# Patient Record
Sex: Male | Born: 1965
Health system: Southern US, Community
[De-identification: ages and names within clinical notes are randomized; demographics above are authoritative.]

## PROBLEM LIST (undated history)

## (undated) DIAGNOSIS — Z9289 Personal history of other medical treatment: Secondary | ICD-10-CM

## (undated) DIAGNOSIS — J189 Pneumonia, unspecified organism: Secondary | ICD-10-CM

## (undated) DIAGNOSIS — Q909 Down syndrome, unspecified: Secondary | ICD-10-CM

## (undated) DIAGNOSIS — E7800 Pure hypercholesterolemia, unspecified: Secondary | ICD-10-CM

## (undated) HISTORY — DX: Pneumonia, unspecified organism: J18.9

## (undated) HISTORY — DX: Down syndrome, unspecified: Q90.9

## (undated) HISTORY — DX: Personal history of other medical treatment: Z92.89

## (undated) HISTORY — DX: Pure hypercholesterolemia, unspecified: E78.00

---

## 2004-10-06 HISTORY — PX: HERNIA REPAIR: SHX51

## 2005-03-14 ENCOUNTER — Ambulatory Visit (HOSPITAL_COMMUNITY): Admission: RE | Admit: 2005-03-14 | Discharge: 2005-03-14 | Payer: Self-pay | Admitting: General Surgery

## 2005-03-14 IMAGING — CT CT ABDOMEN W/ CM
1 of 4 series · 14 of 32 positions shown, 19 images · IV contrast (omnipaque)
Comparison: none

CLINICAL DATA: Abdominal pain and tenderness.  Question hernia.
TECHNIQUE: Multidetector CT imaging of the abdomen and pelvis was performed following the standard protocol during bolus administration of intravenous contrast.
 Contrast:  125 cc Omnipaque 300.
 ABDOMEN CT WITH CONTRAST:
 Imaged lung parenchyma is clear.  Portion of the liver dome is not included in the scan.  Imaged liver parenchyma is normal.  Multiple stones are seen layering dependently in the gallbladder.  Kidneys, spleen, pancreas, and adrenal glands are all normal.  No abdominal lymphadenopathy.  The patient has an umbilical hernia.  This contains only mesenteric fat.  Note is made that the fat within the hernia has interstitial infiltration which appears somewhat more prominent on delayed imaging.  No focal bony abnormality.

[Series 2: abd_pel 5.0 b40f st · axial · 0.80mm/px · z∈[-610,-190]mm · 14 of 96 slices shown, 19 images]
[im 6/96  soft-tissue]
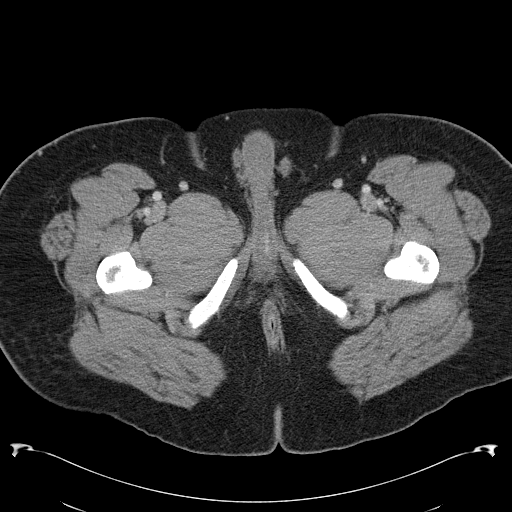
[im 6/96  bone]
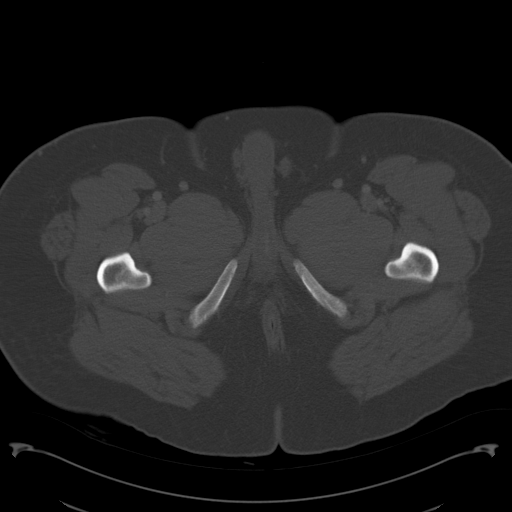
[im 12/96  soft-tissue]
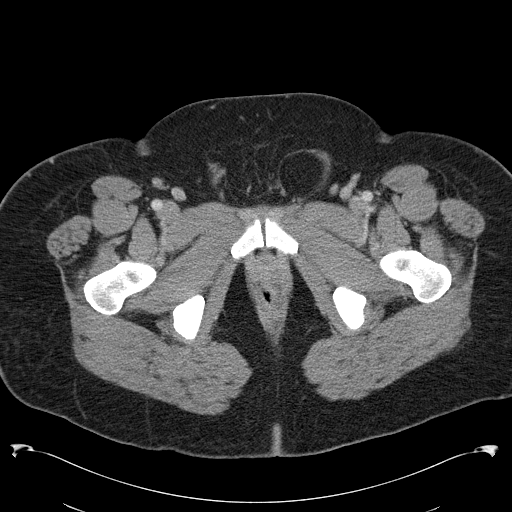
[im 18/96  soft-tissue]
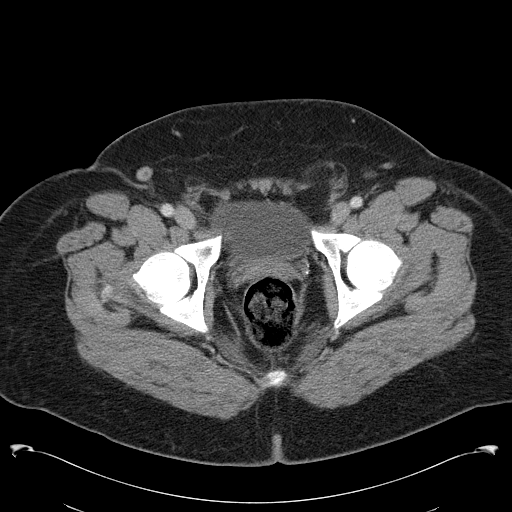
[im 30/96  soft-tissue]
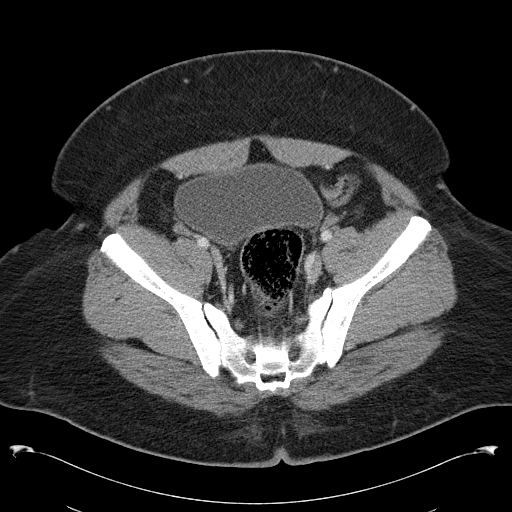
[im 36/96  soft-tissue]
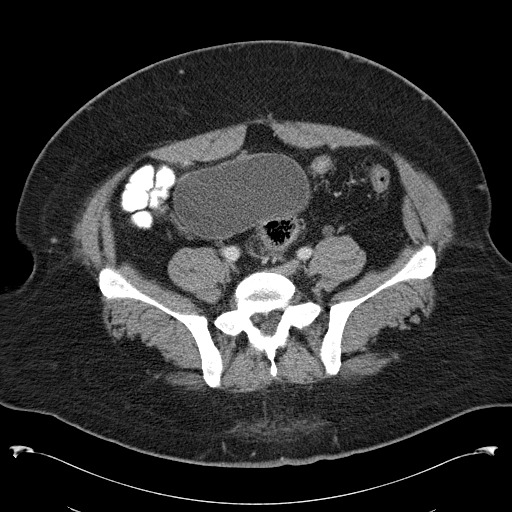
[im 42/96  soft-tissue]
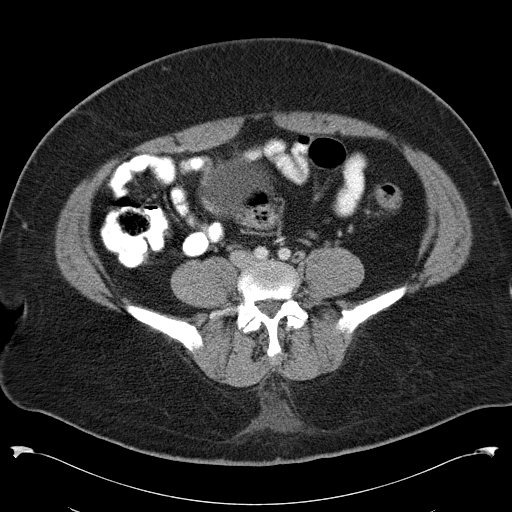
[im 48/96  soft-tissue]
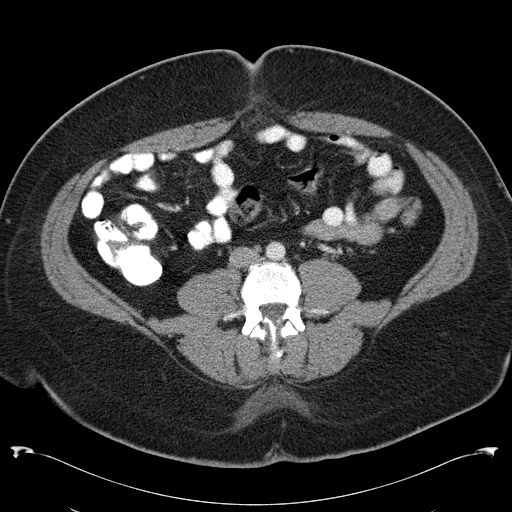
[im 54/96  soft-tissue]
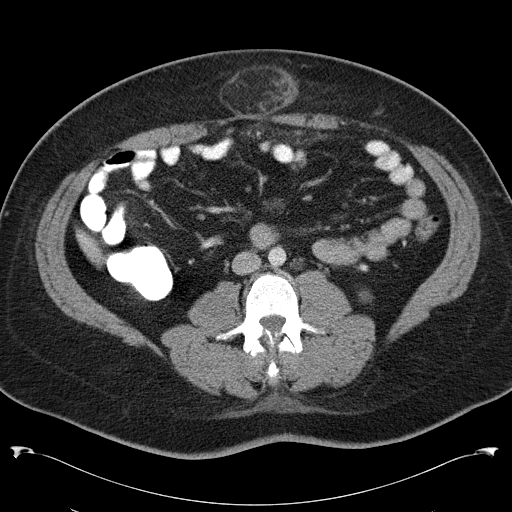
[im 60/96  soft-tissue]
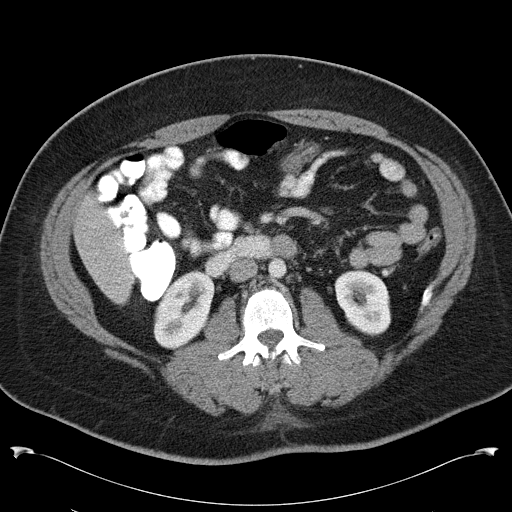
[im 60/96  bone]
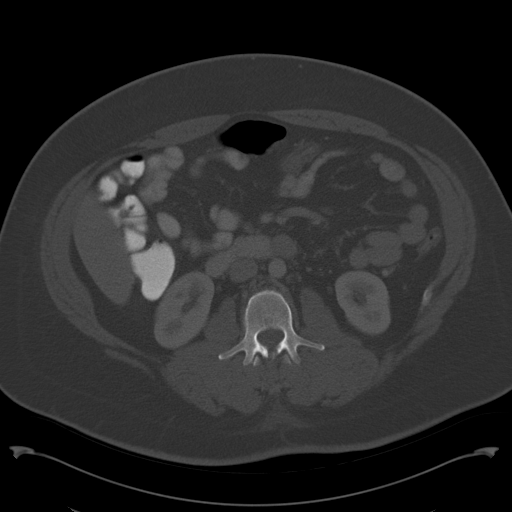
[im 66/96  soft-tissue]
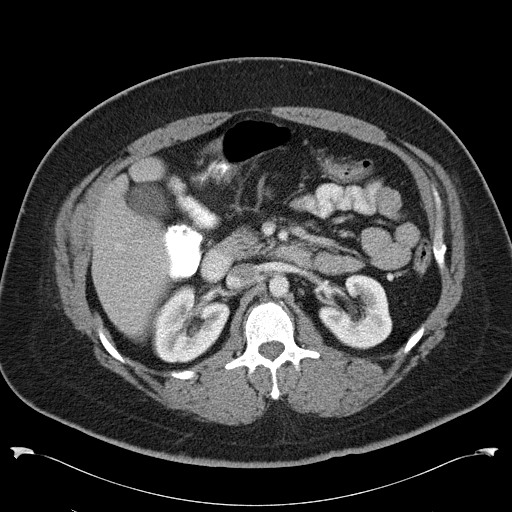
[im 72/96  lung]
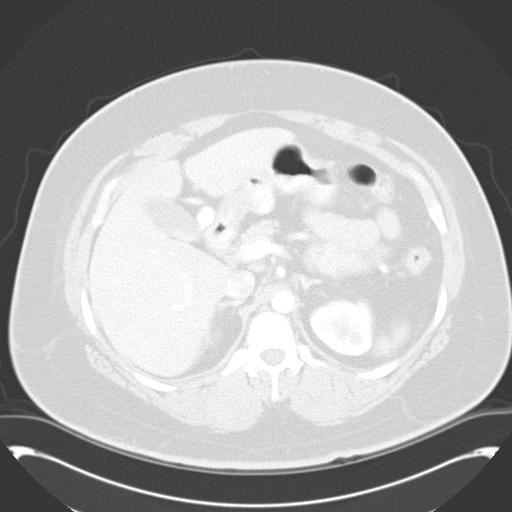
[im 78/96  soft-tissue]
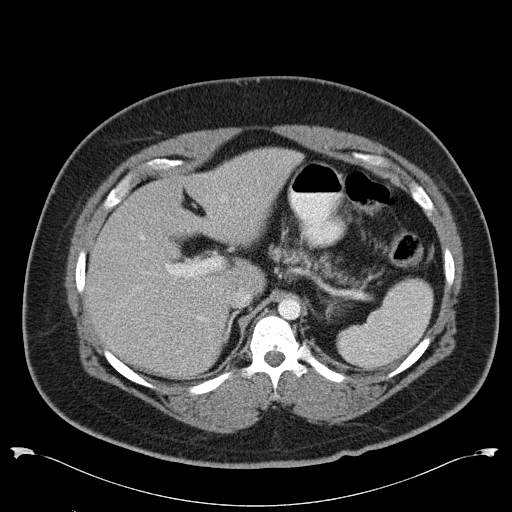
[im 78/96  lung]
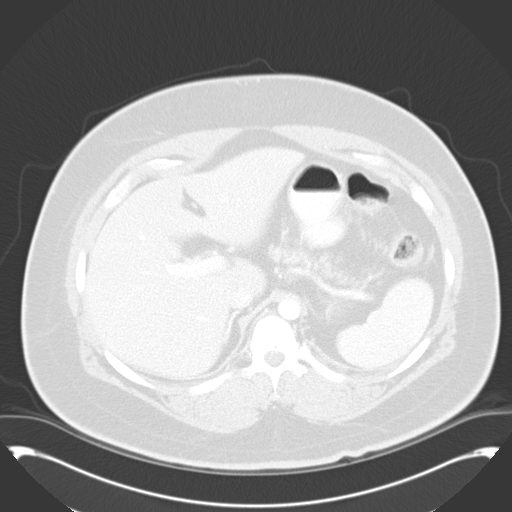
[im 84/96  soft-tissue]
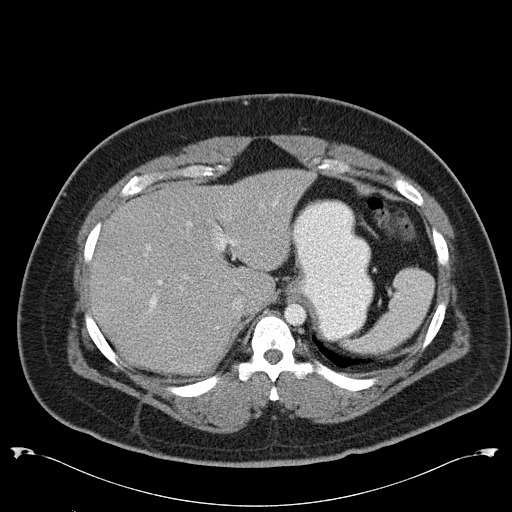
[im 84/96  lung]
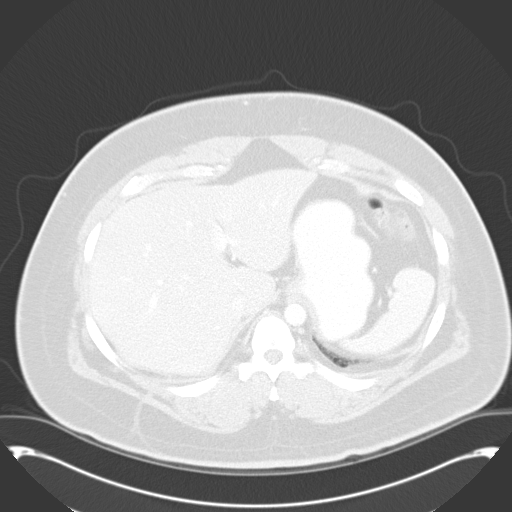
[im 90/96  soft-tissue]
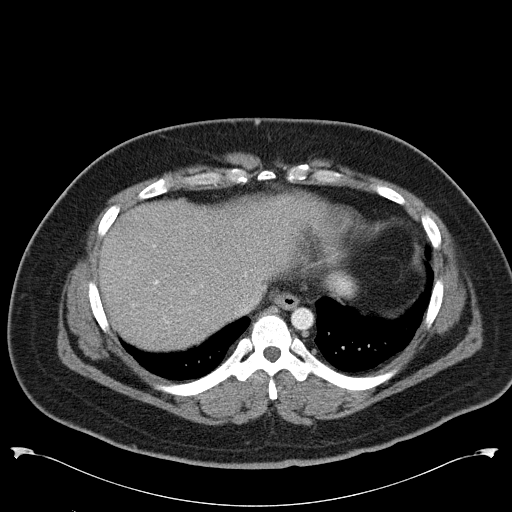
[im 90/96  lung]
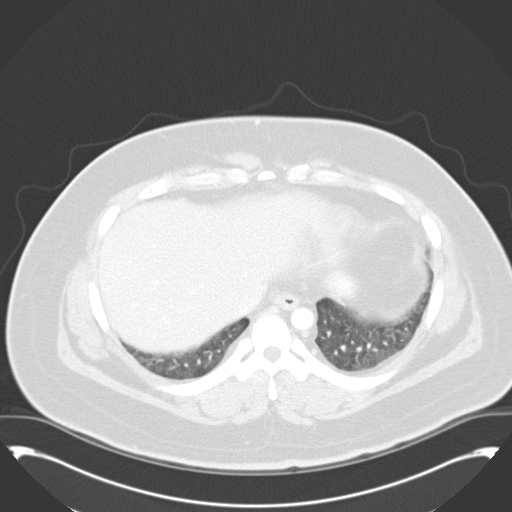

[14 of 32 positions shown; findings below may reference images not displayed]

IMPRESSION: 1.  Fat-containing umbilical hernia with infiltration of the fat within the hernia.  This could be due to inflammatory change or infarction of fat. 
 2.  Gallstones.
 PELVIS CT WITH CONTRAST:
 The patient has bilateral inguinal hernias which contain fat only.  Left-sided hernia is larger.  Colon and urinary bladder appear normal.  Appendix is not discretely visualized, but no inflammatory change is seen in the right lower quadrant.
IMPRESSION: Bilateral inguinal hernias which contain mesenteric fat only.

## 2005-03-14 IMAGING — CR DG CERVICAL SPINE FLEX&EXT ONLY
3 series · 3 of 3 positions shown · non-contrast
Comparison: none

CLINICAL DATA: Down?s Syndrome.  Evaluate for instability.
 LATERAL CERVICAL SPINE WITH FLEXION AND EXTENSION:
 Mild disc degeneration and spurring is present at C3-4, C4-5, C5-6 and C6-7.
 The C1-C2 relationship is normal and there is no abnormal movement on flexion or extension.  Remainder of the cervical alignment is normal.

[view not recorded (1 of 3)]
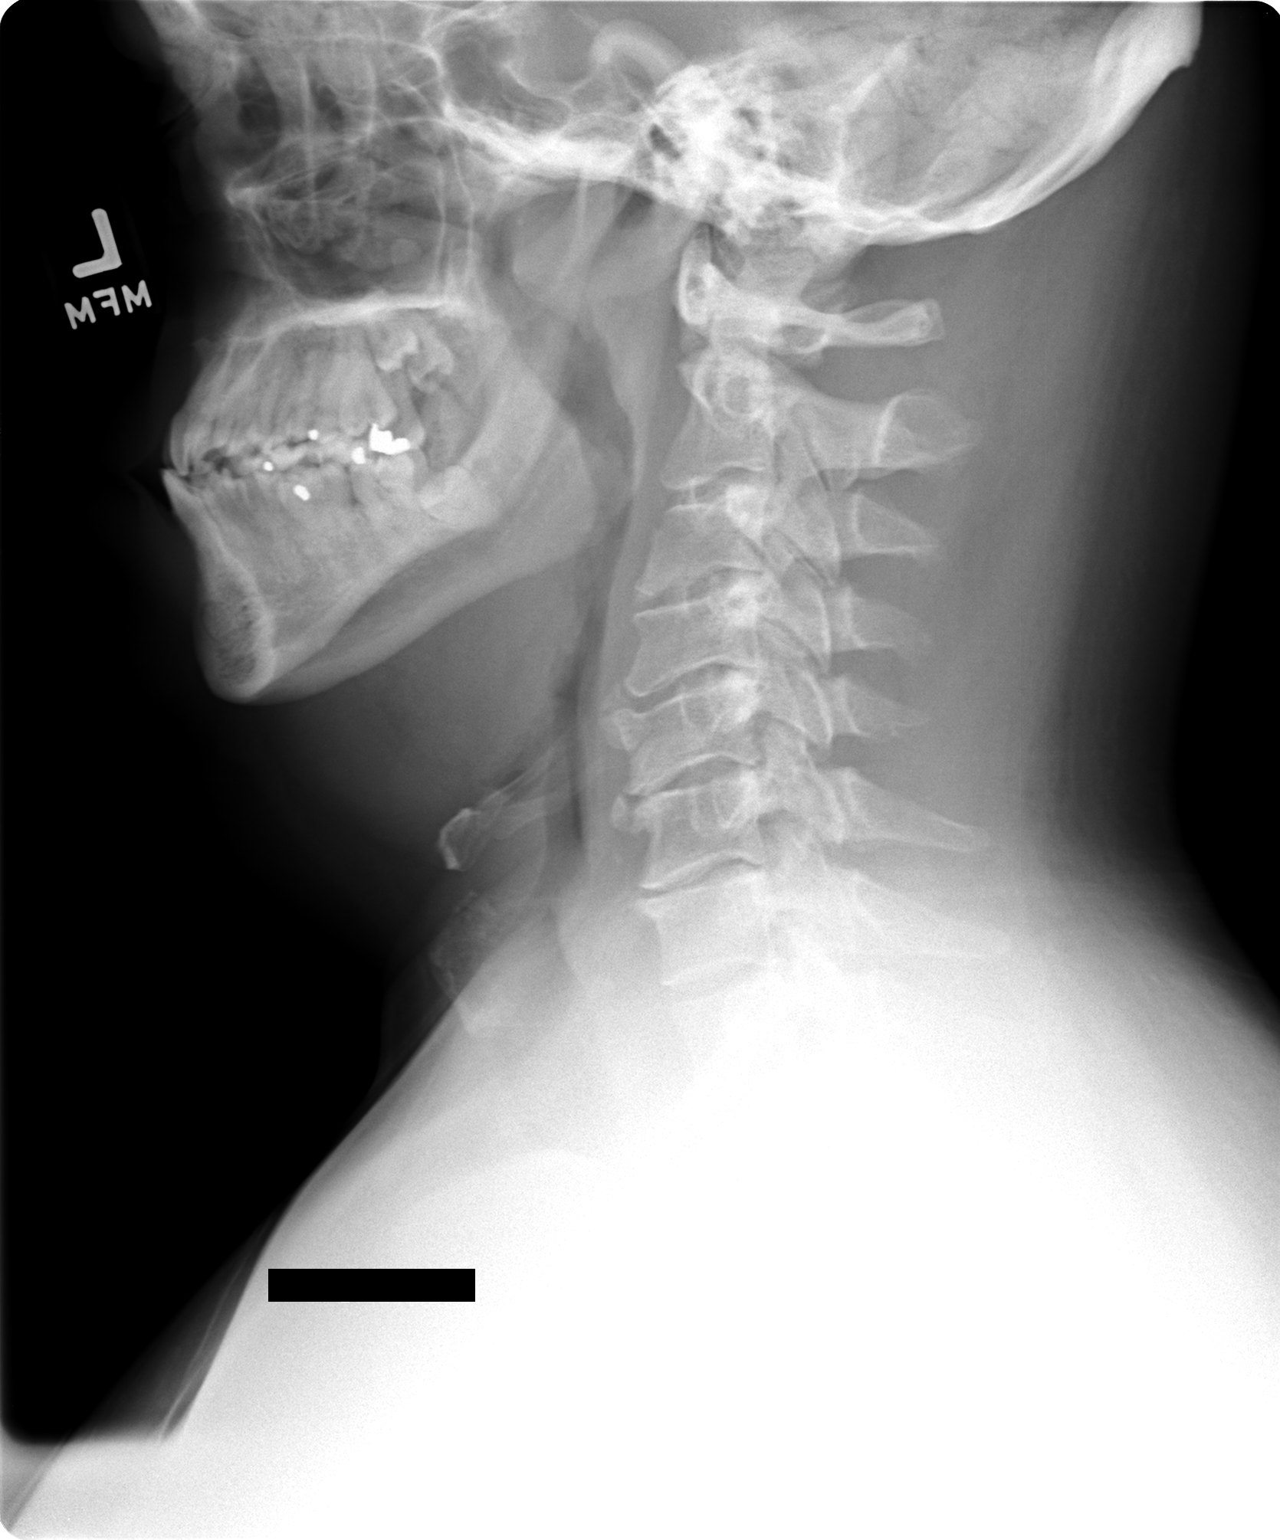

[view not recorded (2 of 3)]
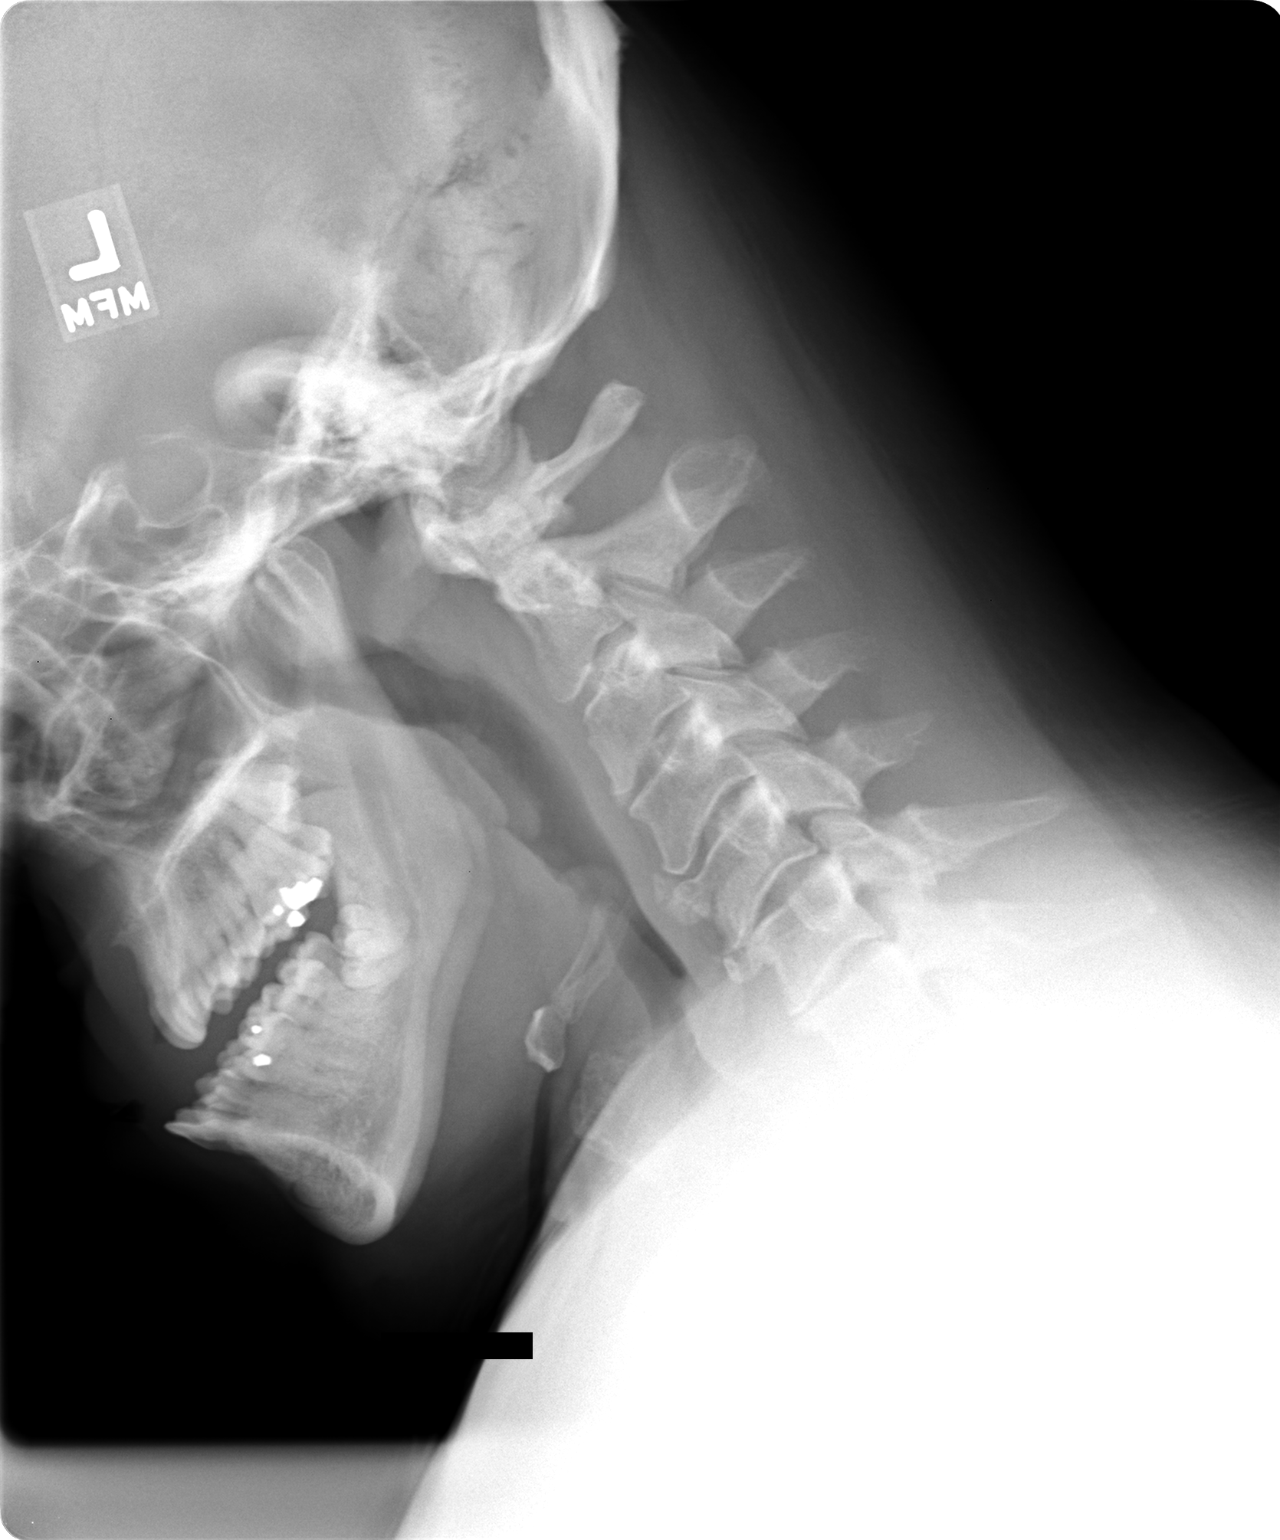

[view not recorded (3 of 3)]
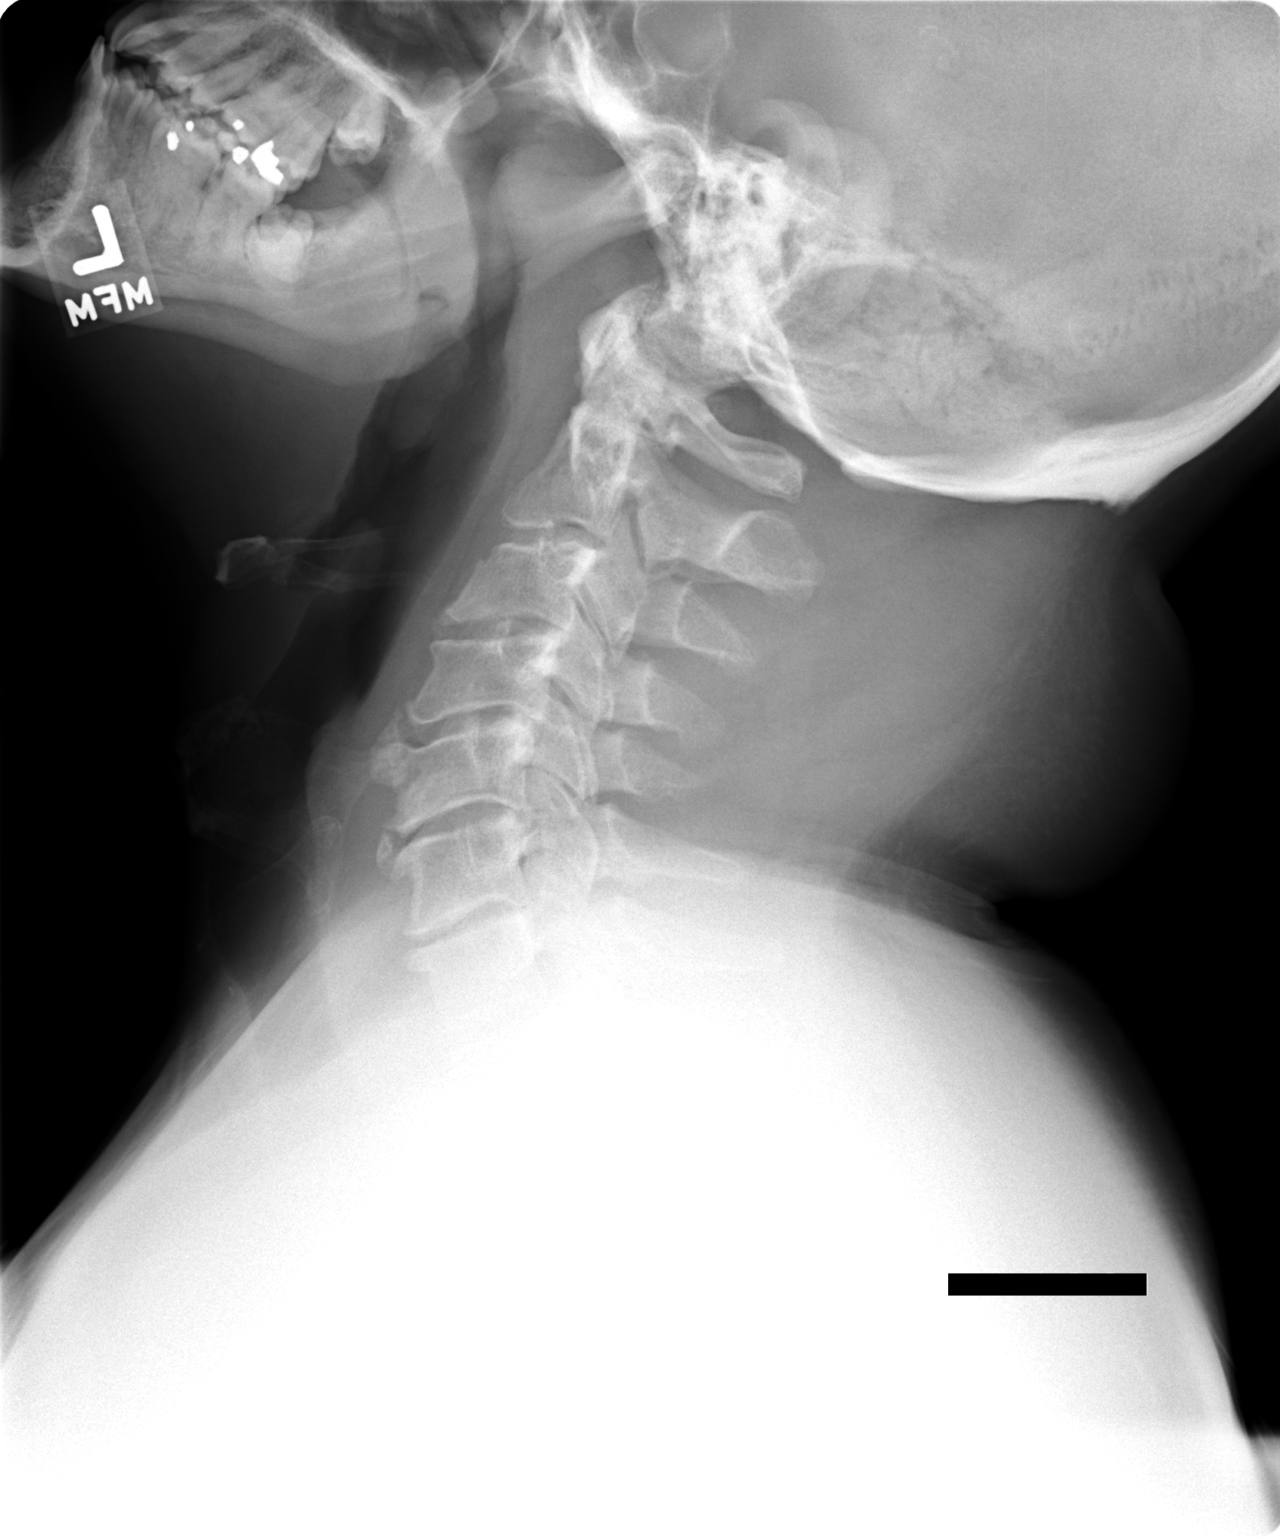

[3 of 3 positions shown; findings below may reference images not displayed]

IMPRESSION: 1. There is no abnormal movement.
 2. Cervical disc degeneration and spondylosis.

## 2005-04-18 ENCOUNTER — Observation Stay (HOSPITAL_COMMUNITY): Admission: RE | Admit: 2005-04-18 | Discharge: 2005-04-19 | Payer: Self-pay | Admitting: General Surgery

## 2005-04-18 IMAGING — CR DG ABD PORTABLE 1V
1 series · 1 of 1 positions shown · non-contrast
Comparison: None.

CLINICAL DATA: Incorrect needle count.

[view not recorded]
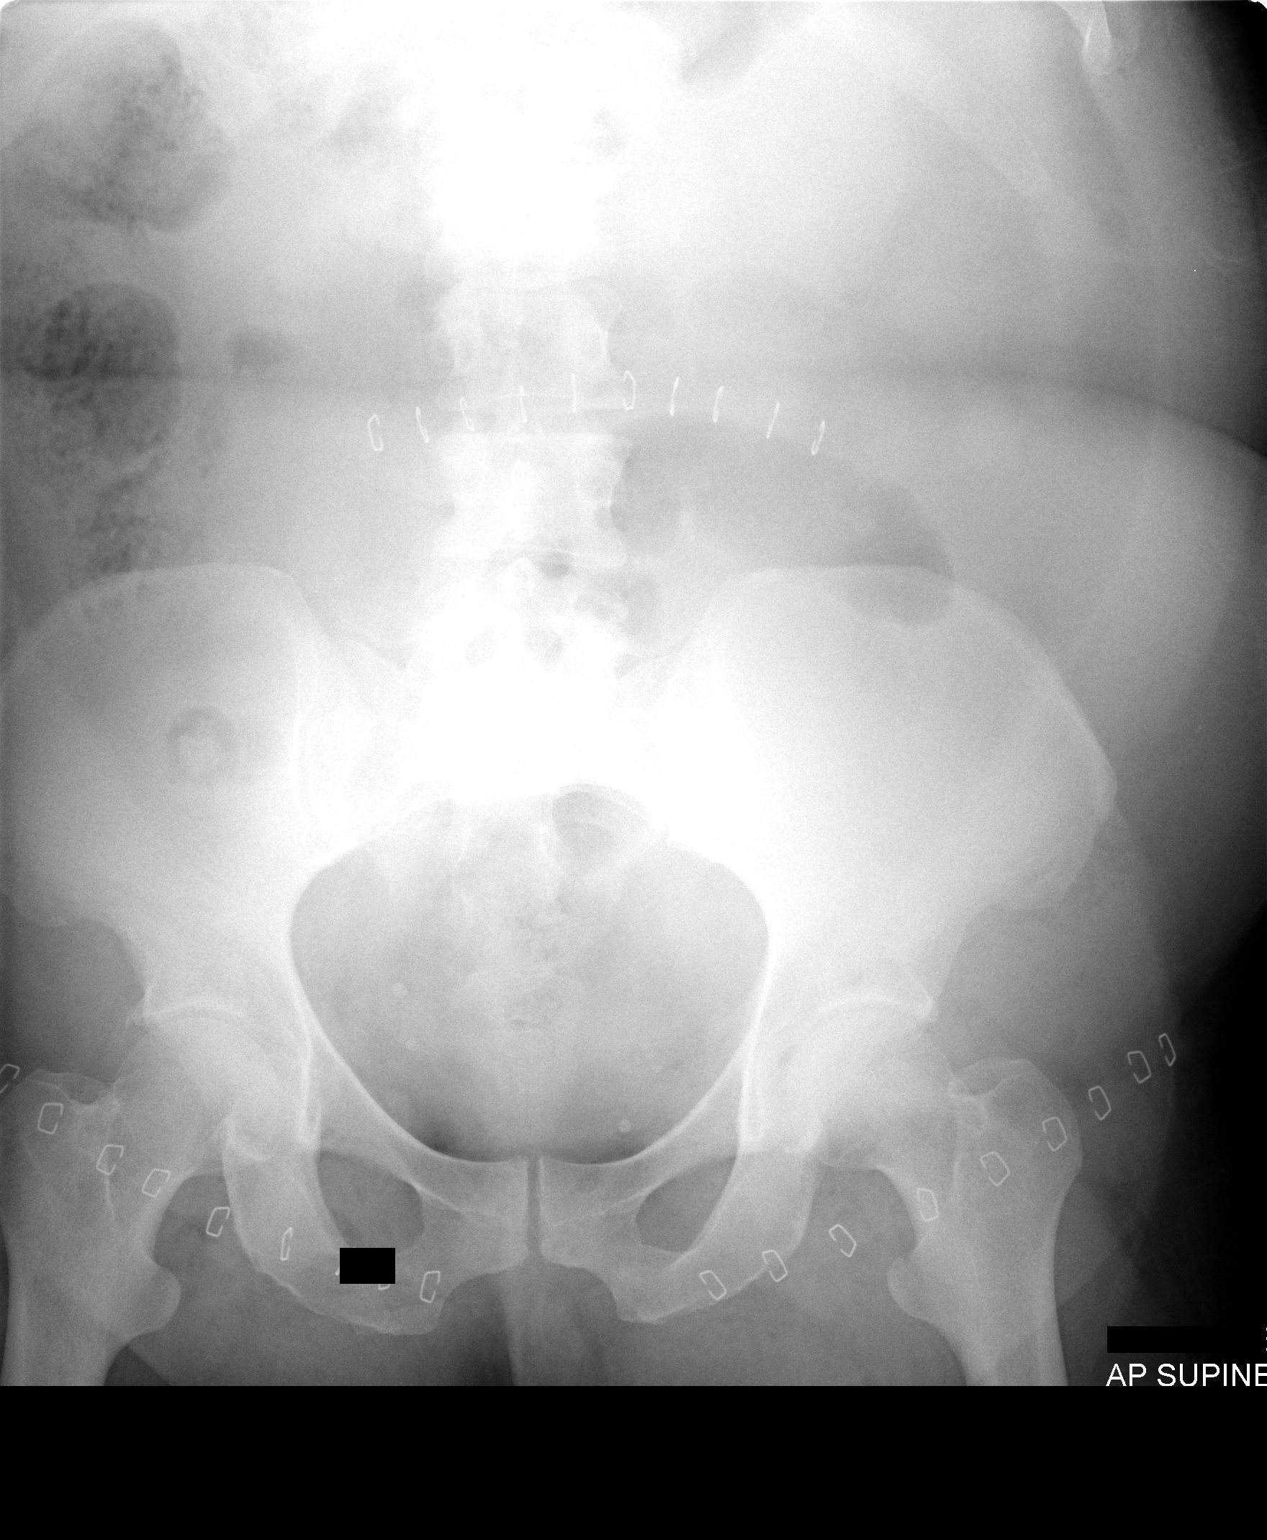

[1 of 1 positions shown; findings below may reference images not displayed]

PORTABLE ABDOMEN - 1 VIEW:

Nonspecific bowel gas pattern. Surgical skin staples over the region of the
umbilicus in both inguinal regions. No unexpected radiopaque foreign body.

[]
IMPRESSION: No evidence for an unexpected radiopaque foreign body

## 2011-11-25 ENCOUNTER — Ambulatory Visit: Payer: Medicare Other

## 2011-11-25 ENCOUNTER — Ambulatory Visit (INDEPENDENT_AMBULATORY_CARE_PROVIDER_SITE_OTHER): Payer: Medicare Other | Admitting: Family Medicine

## 2011-11-25 DIAGNOSIS — R05 Cough: Secondary | ICD-10-CM | POA: Diagnosis not present

## 2011-11-25 DIAGNOSIS — R059 Cough, unspecified: Secondary | ICD-10-CM

## 2011-11-25 DIAGNOSIS — R0602 Shortness of breath: Secondary | ICD-10-CM

## 2011-11-25 DIAGNOSIS — R509 Fever, unspecified: Secondary | ICD-10-CM

## 2011-11-25 DIAGNOSIS — J189 Pneumonia, unspecified organism: Secondary | ICD-10-CM

## 2011-11-25 DIAGNOSIS — H6120 Impacted cerumen, unspecified ear: Secondary | ICD-10-CM | POA: Diagnosis not present

## 2011-11-25 DIAGNOSIS — Q909 Down syndrome, unspecified: Secondary | ICD-10-CM

## 2011-11-25 IMAGING — CR DG CHEST 2V
2 series · 2 of 2 positions shown · non-contrast
Comparison: No priors.

CLINICAL DATA: Cough and fever.

CHEST - 2 VIEW

[PA]
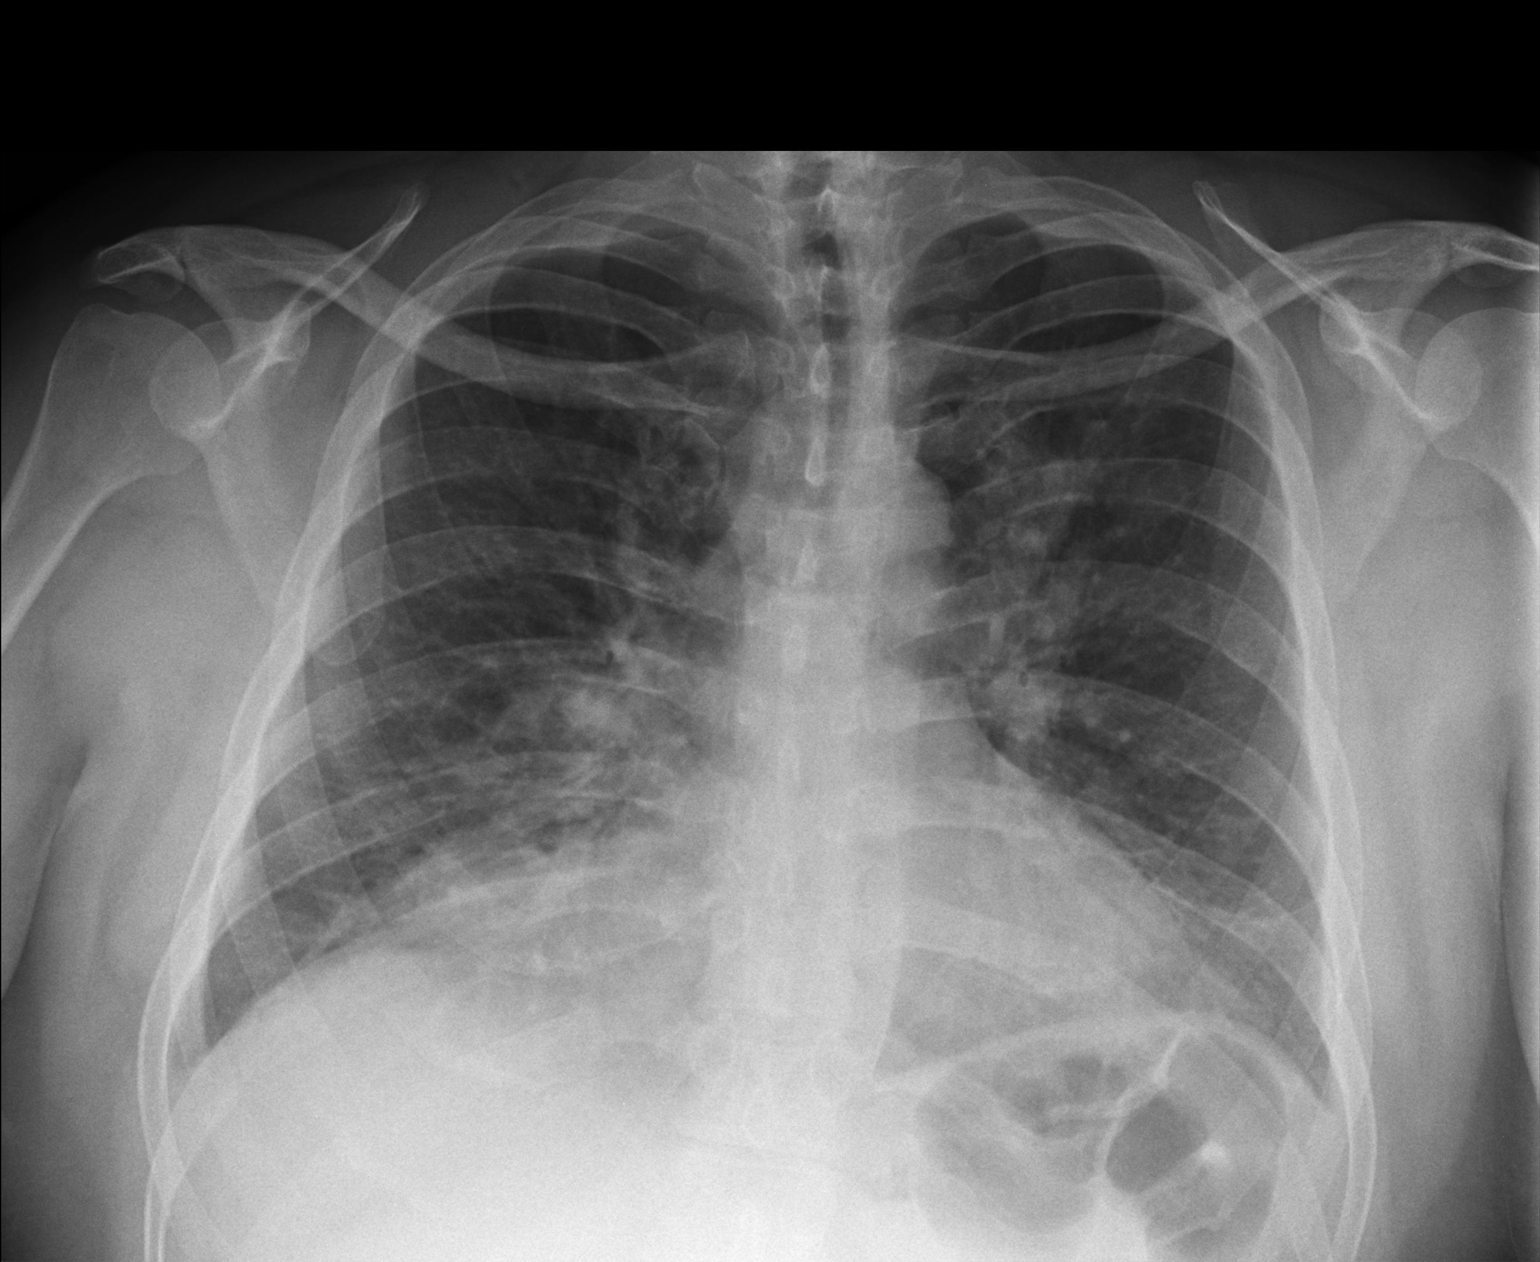

[lateral]
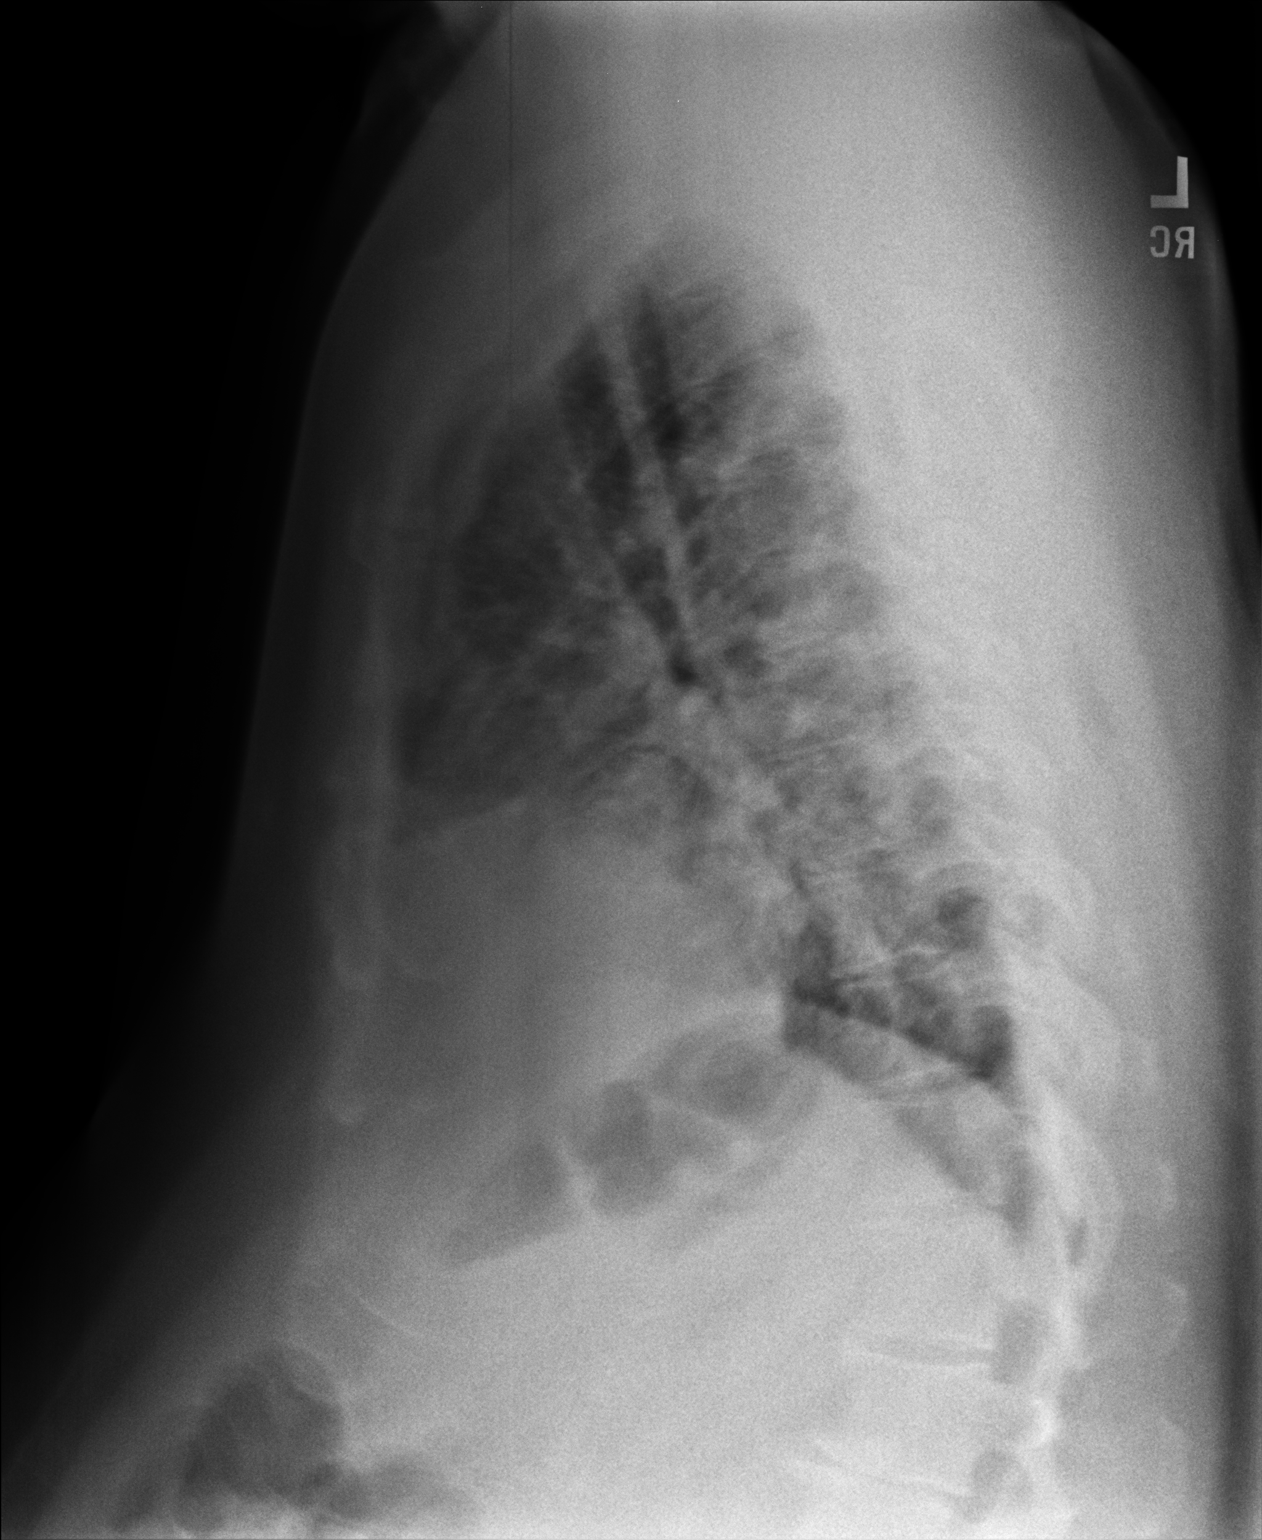

[2 of 2 positions shown; findings below may reference images not displayed]

FINDINGS: Lung volumes are low.  There are bibasilar opacities
(right greater than left), favored to predominately represent
atelectasis, however, an area of airspace consolidation in the
right lower lobe from aspiration or developing pneumonia is
difficult to exclude.  No definite pleural effusions.  Pulmonary
vasculature is normal.  Heart size is normal.  Mediastinal contours
are unremarkable.
IMPRESSION: 1.  Bibasilar opacities (right greater than left).  Findings are
suspicious for right lower lobe pneumonia or sequela of aspiration.
There is also likely a component of bibasilar subsegmental
atelectasis given the low lung volumes.  It is recommended that
repeat PA and lateral radiographs are obtained after a trial of
antimicrobial therapy to ensure complete resolution of these
abnormalities (as a centrally obstructing right hilar lesion could
present with similar findings).

These results will be called to the ordering clinician or
representative by the Radiologist Assistant, and communication
documented in the PACS Dashboard.

## 2011-11-25 MED ORDER — CEFTRIAXONE SODIUM 1 G IJ SOLR
1.0000 g | Freq: Once | INTRAMUSCULAR | Status: AC
  Administered 2011-11-25: 1 g via INTRAMUSCULAR

## 2011-11-25 MED ORDER — CEFDINIR 300 MG PO CAPS: 300.0000 mg | ORAL_CAPSULE | Freq: Two times a day (BID) | ORAL | Status: AC

## 2011-11-25 NOTE — Progress Notes (Signed)
Patient Name: Joseph Williams Date of Birth: [birth year] Medical Record Number: [id number] Gender: male Date of Encounter: 11/25/2011  History of Present Illness:  Joseph Williams is a 46 y.o. very pleasant male patient who presents with the following:  Gentleman with Down syndrome who is here with his mother today.  Yesterday he had complaint of not being able to hear well.  Today his chest was congested.  Has had nasal congestion.  Cough started yesterday as well.  They had not noticed a fever at home.  No recent GI symptoms. History of pneumonia as a child, but as of late he has been very healthy.    He lives with his mother.  His father passed away 12 years ago.   Took claritin yesterday- nothing so far today.    Patient Active Problem List  Diagnoses  . Down's syndrome   History reviewed. No pertinent past medical history. Past Surgical History  Procedure Date  . Hernia repair 2006    bilateal inguinal and ventral   History  Substance Use Topics  . Smoking status: Never Smoker   . Smokeless tobacco: Never Used  . Alcohol Use: No   History reviewed. No pertinent family history. No Known Allergies  Medication list has been reviewed and updated.  Review of Systems: As per HPI.  Per his mother SBP in the 90s is typical for him.  He had a cardiac evaluation in 2006 prior to his hernia repairs- this was normal.    Physical Examination: Filed Vitals:   11/25/11 1246  BP: 94/56  Pulse: 123  Temp: 99.9 F (37.7 C)  Resp: 24  Height: 5\' 3"  (1.6 m)  Weight: 230 lb (104.327 kg)  SpO2: 96%   Recheck pulse 115, O2 sat 94% Body mass index is 40.74 kg/(m^2).  GEN: WDWN, NAD, Non-toxic, A & O x 3, obese, Downs facies HEENT: Atraumatic, Normocephalic. Neck supple. No masses, No LAD. Ears and Nose: No external deformity.  B TM obscured by cerumen, oropharynx wnl CV: mildly tachycardic, No M/G/R. No JVD. No thrill. No extra heart sounds. PULM: CTA B, no wheezes, crackles,  rhonchi. Decreased breath sounds on the left.  No distress ABD: S, NT, ND.   EXTR: No c/c/e NEURO Normal gait.  PSYCH: Does not talk a whole lot but is alert and answers questions when asked. Does not seem to be in distress or pain.   Mother decided to go ahead and irrigate ears today- we did irrigation to remove cerumen- TM and ear canals then wnl  [facility] reading (PRIMARY) by  Dr. Patsy Lager.  CXR: extensive right and retrocardiac pneumonia.   CHEST - 2 VIEW  Comparison: No priors.  Findings: Lung volumes are low. There are bibasilar opacities (right greater than left), favored to predominately represent atelectasis, however, an area of airspace consolidation in the right lower lobe from aspiration or developing pneumonia is difficult to exclude. No definite pleural effusions. Pulmonary vasculature is normal. Heart size is normal. Mediastinal contours are unremarkable.  IMPRESSION: 1. Bibasilar opacities (right greater than left). Findings are suspicious for right lower lobe pneumonia or sequela of aspiration. There is also likely a component of bibasilar subsegmental atelectasis given the low lung volumes. It is recommended that repeat PA and lateral radiographs are obtained after a trial of antimicrobial therapy to ensure complete resolution of these abnormalities (as a centrally obstructing right hilar lesion could present with similar findings).  These results will be called to the ordering clinician or representative  by the Radiologist Assistant, and communication documented in the PACS Dashboard.  Original Report Authenticated By: Florencia Reasons, M.D.   Assessment and Plan: 1. Down's syndrome    2. Cough  DG Chest 2 View  3. Fever  DG Chest 2 View  4. Impacted cerumen    5. Pneumonia  cefTRIAXone (ROCEPHIN) injection 1 g, cefdinir (OMNICEF) 300 MG capsule   46 year old male with Down syndrome who presents with pneumonia today as well as cerumen impaciton.  Discussed  in detail with his mother who is his caretaker and who is also a Engineer, civil (consulting).  Offered admission for supportive care such as IV fluuids.  His mother feels that hospitalization will be very upsetting for Joseph Williams, and she feels comfortable taking him home.  She will offer him plenty of fluids and will take him to the ED if he seems worse in any way tonight.  Otherwise they will RTC tomorrow for a recheck.  Given a gram of Rocephin today, rx to start Omnicef in the morning.    Tomorrow will discuss plans to do a recheck CXR as well- see radiology report above.    Cerumen impaction resolved

## 2011-11-26 ENCOUNTER — Encounter: Payer: Self-pay | Admitting: Family Medicine

## 2011-11-26 ENCOUNTER — Ambulatory Visit (INDEPENDENT_AMBULATORY_CARE_PROVIDER_SITE_OTHER): Payer: Medicare Other | Admitting: Family Medicine

## 2011-11-26 VITALS — BP 90/56 | HR 80 | Temp 98.3°F | Resp 20 | Ht 62.0 in | Wt 229.0 lb

## 2011-11-26 DIAGNOSIS — J189 Pneumonia, unspecified organism: Secondary | ICD-10-CM

## 2011-11-26 NOTE — Progress Notes (Signed)
Patient Name: Joseph Williams Date of Birth: [birth year] Medical Record Number: [id number] Gender: male Date of Encounter: 11/26/2011  History of Present Illness:  ORVILL Williams is a 46 y.o. very pleasant male patient who presents with the following:  Here for a recheck of pneumonia- see OV yesterday.  Last night went well- he slept well, and was able to drink plenty of fluids and ate some although not as well as usual.  His mother feels that he is a lot better and is very pleased with his progress. She already gave him his first dose of Omnicef this morning.    Patient Active Problem List  Diagnoses  . Down's syndrome   No past medical history on file. Past Surgical History  Procedure Date  . Hernia repair 2006    bilateal inguinal and ventral   History  Substance Use Topics  . Smoking status: Never Smoker   . Smokeless tobacco: Never Used  . Alcohol Use: No   No family history on file. No Known Allergies  Medication list has been reviewed and updated.  Review of Systems: As per HPI.  Shalev says that he feels ok, denies any chest pain or body aches  Physical Examination: Filed Vitals:   11/26/11 0904  BP: 90/56  Pulse: 80  Temp: 98.3 F (36.8 C)  TempSrc: Oral  Resp: 20  Height: 5\' 2"  (1.575 m)  Weight: 229 lb (103.874 kg)  SpO2: 94%    Body mass index is 41.88 kg/(m^2).  GEN: WDWN, NAD, Non-toxic, A & O x 3, obese HEENT: Atraumatic, Normocephalic. Neck supple. No masses, No LAD. Ears and Nose: No external deformity. CV: RRR, No M/G/R. No JVD. No thrill. No extra heart sounds. PULM:   Still has decreased breath sounds bilaterally, but better air movement than yesterday.  Breathing comfortably.  No crackles or wheezes.  No resp. distress. No accessory muscle use. ABD: S, NT, ND. EXTR: No c/c/e NEURO Normal gait.  PSYCH: calm and appropriate affect, no distress.   CXR over read from yesterday: CHEST - 2 VIEW  Comparison: No priors.  Findings: Lung  volumes are low. There are bibasilar opacities  (right greater than left), favored to predominately represent  atelectasis, however, an area of airspace consolidation in the  right lower lobe from aspiration or developing pneumonia is  difficult to exclude. No definite pleural effusions. Pulmonary  vasculature is normal. Heart size is normal. Mediastinal contours  are unremarkable.  IMPRESSION:  1. Bibasilar opacities (right greater than left). Findings are  suspicious for right lower lobe pneumonia or sequela of aspiration.  There is also likely a component of bibasilar subsegmental  atelectasis given the low lung volumes. It is recommended that  repeat PA and lateral radiographs are obtained after a trial of  antimicrobial therapy to ensure complete resolution of these  abnormalities (as a centrally obstructing right hilar lesion could  present with similar findings).  These results will be called to the ordering clinician or  representative by the Radiologist Assistant, and communication  documented in the PACS Dashboard.  Original Report Authenticated By: Florencia Reasons, M.D.      Assessment and Plan: 1. Pneumonia    Continue PO omnicef.  Discussed repeating IM rocephin, but as Jacquis has already started the PO omnicef will hold off.  Continue careful observation at home and come back right away if worsening.  Continue to push fluids.  Return to clinic for a repeat CXR in 10- 14 days.  Mother agreed with plan.

## 2011-12-03 ENCOUNTER — Ambulatory Visit: Payer: Medicare Other

## 2011-12-03 ENCOUNTER — Ambulatory Visit (INDEPENDENT_AMBULATORY_CARE_PROVIDER_SITE_OTHER): Payer: Medicare Other | Admitting: Family Medicine

## 2011-12-03 DIAGNOSIS — J189 Pneumonia, unspecified organism: Secondary | ICD-10-CM

## 2011-12-03 DIAGNOSIS — J159 Unspecified bacterial pneumonia: Secondary | ICD-10-CM

## 2011-12-03 IMAGING — CR DG CHEST 2V
2 series · 2 of 2 positions shown · non-contrast
Comparison: [DATE].

CLINICAL DATA: 45-year-old male with pneumonia.

CHEST - 2 VIEW

[PA]
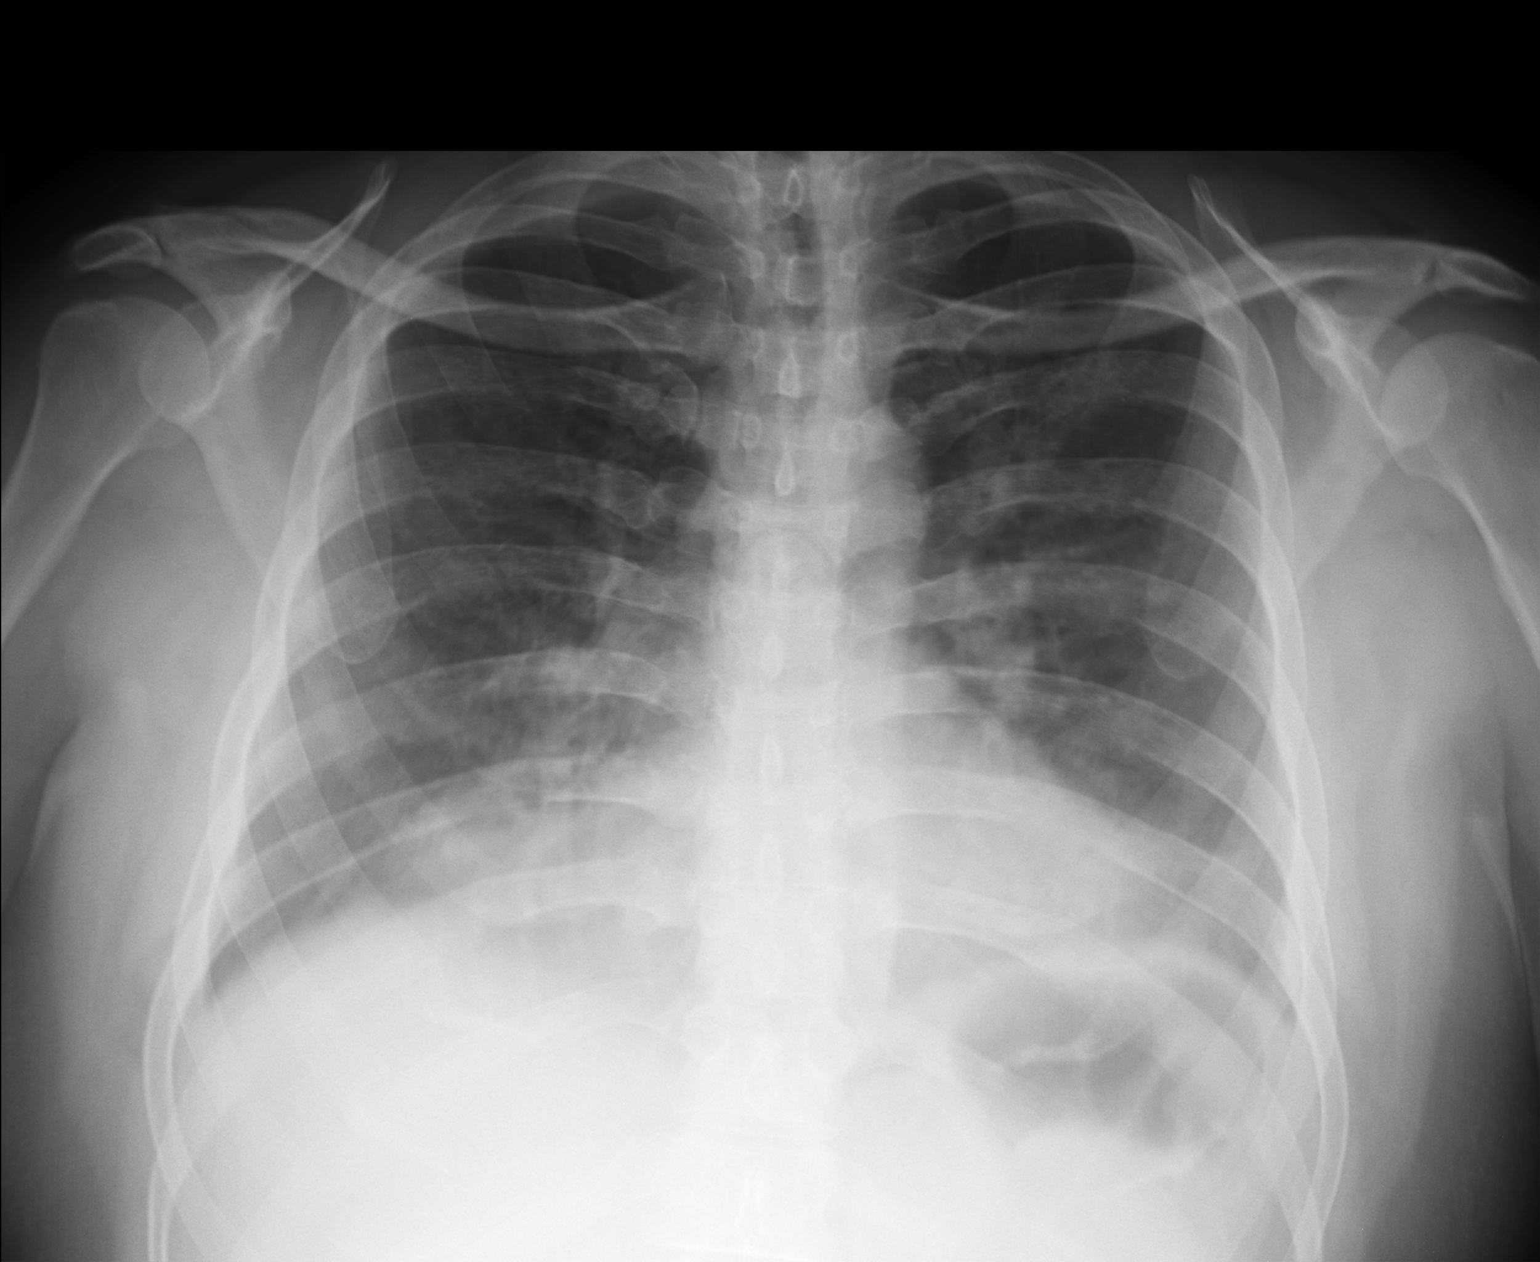

[lateral]
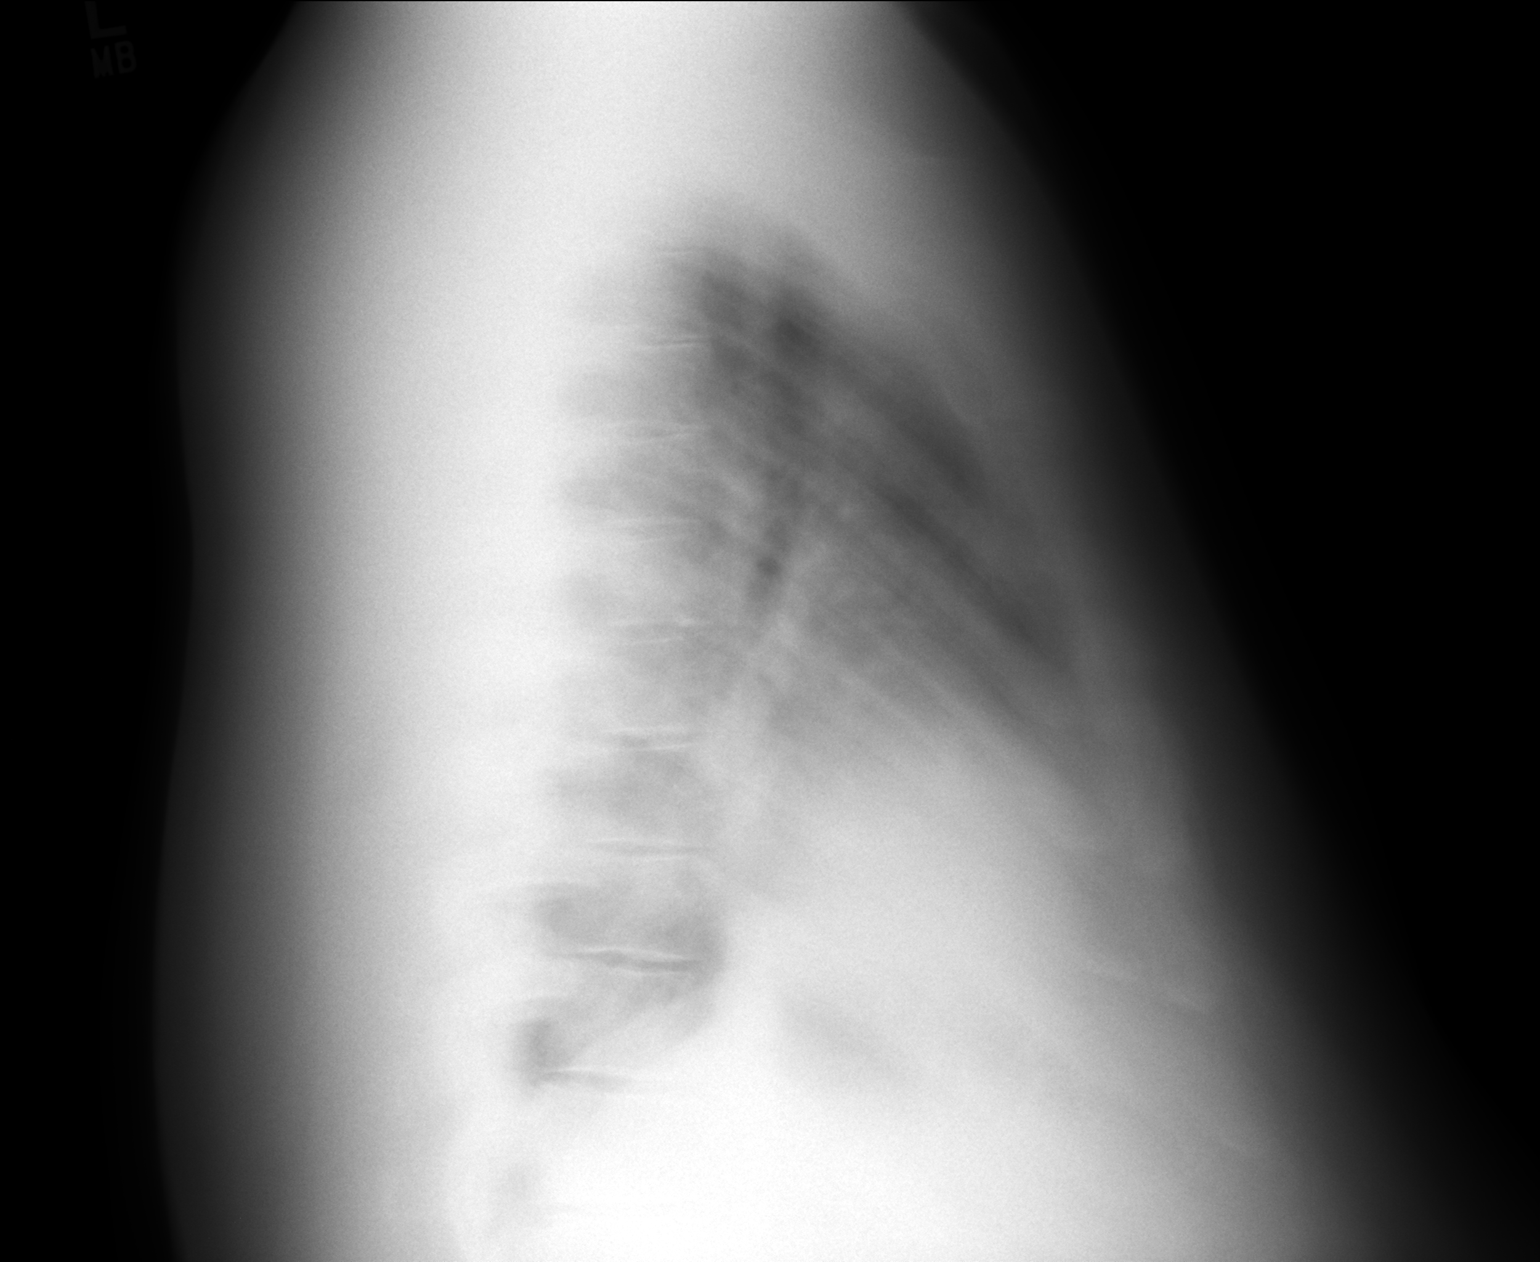

[2 of 2 positions shown; findings below may reference images not displayed]

FINDINGS: Respiratory and patient motion artifact on today's exam.
No significant improvement in bibasilar pulmonary opacity which
seems to correspond to the  anterior lower lobes on the lateral
view.  No pneumothorax.  No large effusion.  Stable cardiac size
and mediastinal contours.  Visualized tracheal air column is within
normal limits.  No gross osseous abnormality.
IMPRESSION: No improvement in bibasilar pulmonary abnormality since [DATE].
Motion degradation.

Discrepancy from primary reading by Dr. ALEXIS:  None

## 2011-12-03 NOTE — Progress Notes (Signed)
This is a 46 year old patient with Down syndrome who comes in with a history of pneumonia which started last week has been treated with [facility] for 7 days. His mother brings him in today and reports that he is sleeping well, eating well and doing much much better. His cough is almost gone and his fever has resolved.  Objective: HEENT unremarkable  Patient is in no acute distress, skin is warm and dry  Chest: Few expiratory wheezes but otherwise clear  [facility] reading (PRIMARY) by  Dr. Milus Glazier:  Persistent infiltrates bilaterally, blurry lateral  Reason assessment  Assessment: Resolving pneumonia clinically with persistent radiographic findings of pneumonia.  Plan: 1 final visit in a week

## 2011-12-03 NOTE — Patient Instructions (Signed)
Return 1 week for one final check

## 2011-12-11 ENCOUNTER — Ambulatory Visit: Payer: Medicare Other

## 2011-12-11 ENCOUNTER — Ambulatory Visit (INDEPENDENT_AMBULATORY_CARE_PROVIDER_SITE_OTHER): Payer: Medicare Other | Admitting: Family Medicine

## 2011-12-11 VITALS — BP 119/77 | HR 112 | Temp 98.0°F | Resp 16 | Ht 62.5 in | Wt 226.8 lb

## 2011-12-11 DIAGNOSIS — E785 Hyperlipidemia, unspecified: Secondary | ICD-10-CM | POA: Diagnosis not present

## 2011-12-11 DIAGNOSIS — Q909 Down syndrome, unspecified: Secondary | ICD-10-CM | POA: Diagnosis not present

## 2011-12-11 DIAGNOSIS — I517 Cardiomegaly: Secondary | ICD-10-CM | POA: Diagnosis not present

## 2011-12-11 DIAGNOSIS — J180 Bronchopneumonia, unspecified organism: Secondary | ICD-10-CM | POA: Diagnosis not present

## 2011-12-11 DIAGNOSIS — J189 Pneumonia, unspecified organism: Secondary | ICD-10-CM

## 2011-12-11 LAB — COMPREHENSIVE METABOLIC PANEL
AST: 18 U/L (ref 0–37)
Albumin: 3.7 g/dL (ref 3.5–5.2)
Alkaline Phosphatase: 64 U/L (ref 39–117)
CO2: 28 mEq/L (ref 19–32)
Chloride: 104 mEq/L (ref 96–112)
Creat: 1.04 mg/dL (ref 0.50–1.35)
Sodium: 141 mEq/L (ref 135–145)
Total Bilirubin: 0.4 mg/dL (ref 0.3–1.2)
Total Protein: 6.8 g/dL (ref 6.0–8.3)

## 2011-12-11 LAB — POCT CBC
Granulocyte percent: 59.7 %G (ref 37–80)
Hemoglobin: 14.8 g/dL (ref 14.1–18.1)
MCH, POC: 32 pg — AB (ref 27–31.2)
MCHC: 32.4 g/dL (ref 31.8–35.4)
MID (cbc): 0.4 (ref 0–0.9)
POC Granulocyte: 2.6 (ref 2–6.9)
POC LYMPH PERCENT: 31.3 %L (ref 10–50)
POC MID %: 9 %M (ref 0–12)
RBC: 4.62 M/uL — AB (ref 4.69–6.13)
WBC: 4.4 10*3/uL — AB (ref 4.6–10.2)

## 2011-12-11 LAB — LIPID PANEL
Total CHOL/HDL Ratio: 4.9 Ratio
Triglycerides: 128 mg/dL (ref <150)
VLDL: 26 mg/dL (ref 0–40)

## 2011-12-11 IMAGING — CR DG CHEST 2V
2 series · 2 of 2 positions shown · non-contrast
Comparison: [DATE]

CLINICAL DATA: Follow up pneumonia

CHEST - 2 VIEW

[PA]
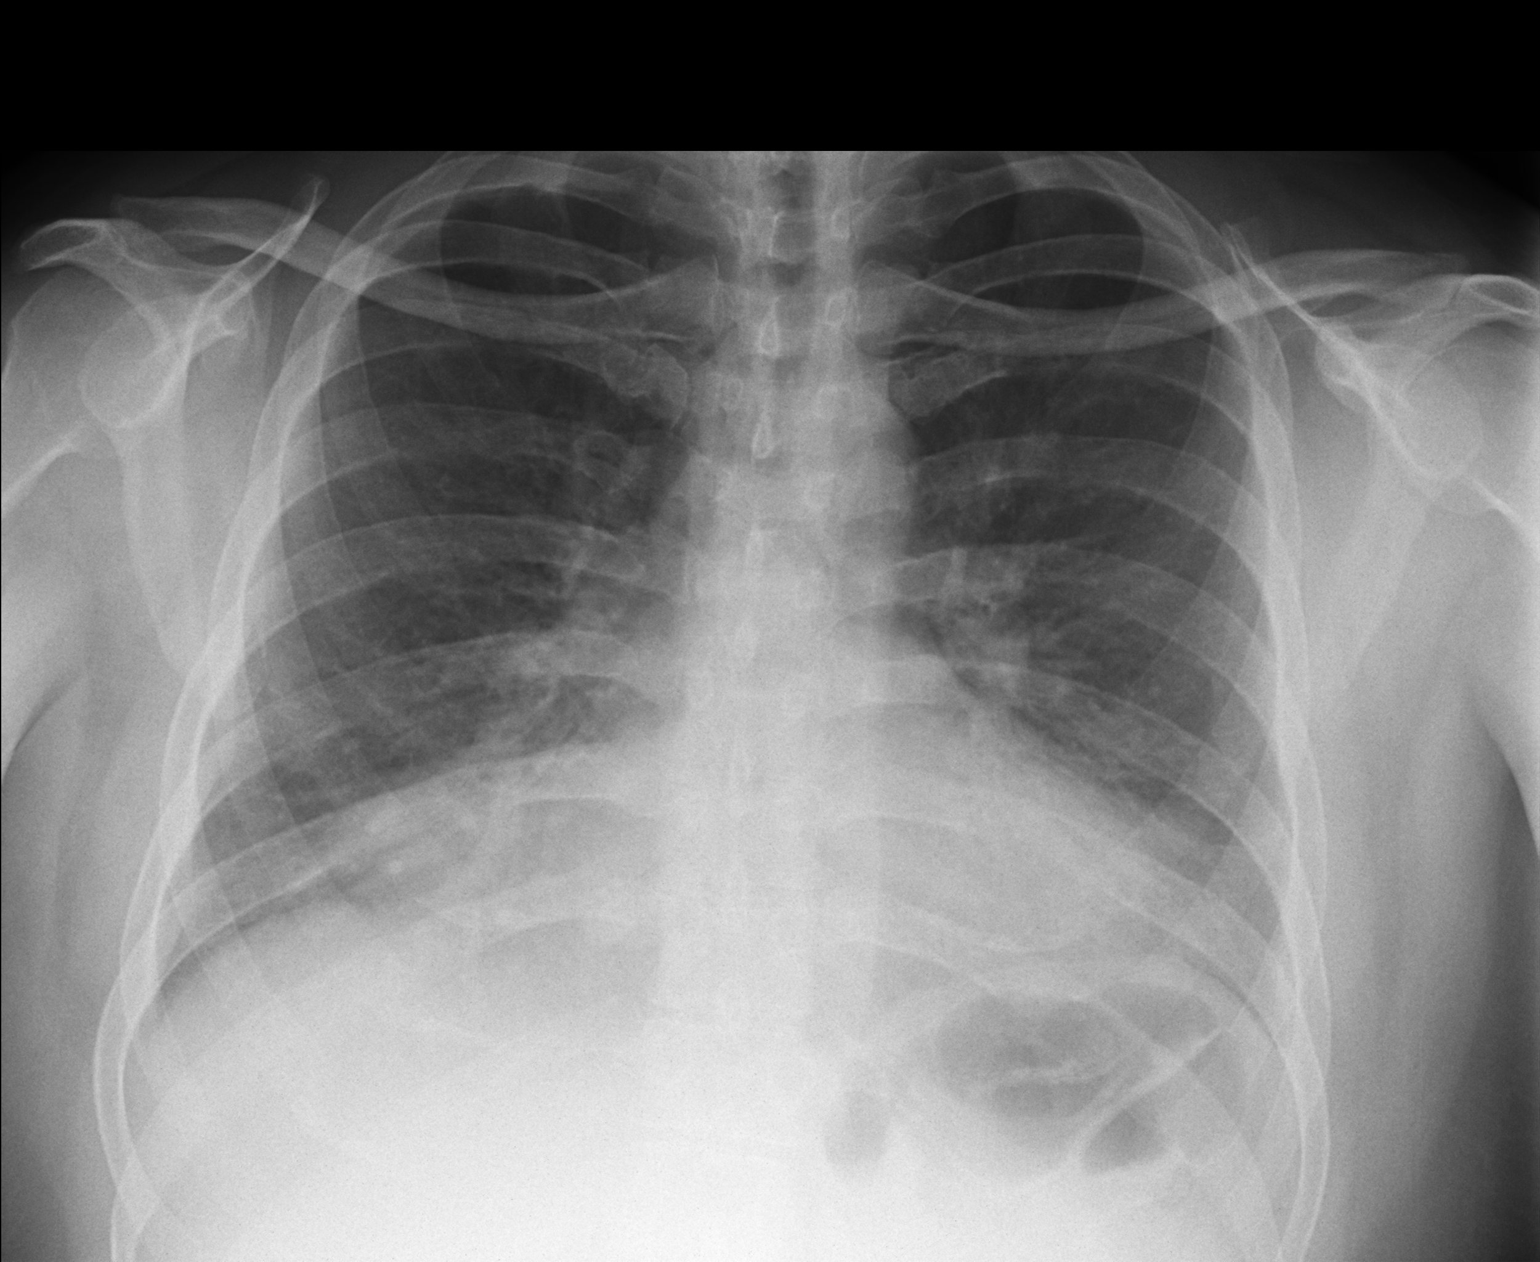

[lateral]
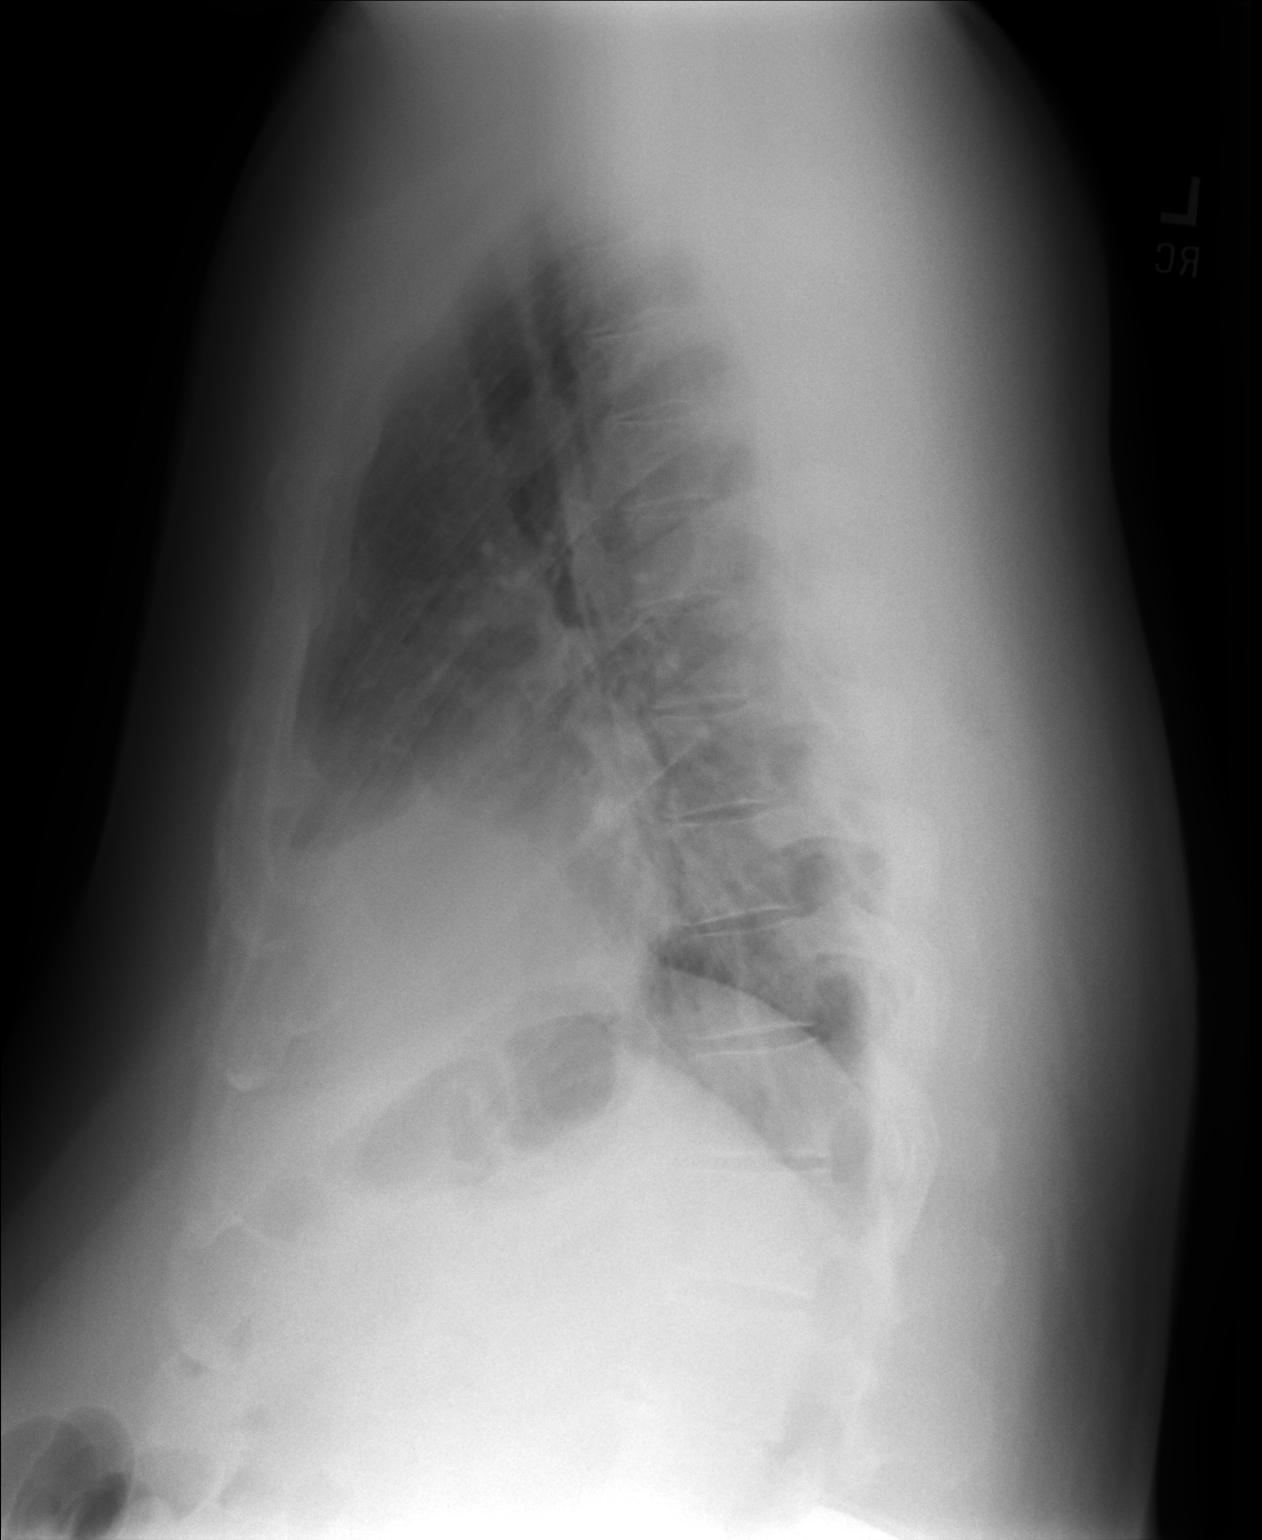

[2 of 2 positions shown; findings below may reference images not displayed]

FINDINGS: Bilateral lower lobe opacities, grossly unchanged,
suspicious for pneumonia.

Cardiomegaly.  No frank interstitial edema. No pleural effusion or
pneumothorax.

Visualized osseous structures are within normal limits.
IMPRESSION: Bilateral lower lobe opacities, grossly unchanged, suspicious for
pneumonia.

Cardiomegaly.  No frank interstitial edema.

## 2011-12-11 MED ORDER — FUROSEMIDE 20 MG PO TABS: 20.0000 mg | ORAL_TABLET | Freq: Every day | ORAL | Status: DC

## 2011-12-11 MED ORDER — LEVOFLOXACIN 500 MG PO TABS: 500.0000 mg | ORAL_TABLET | Freq: Every day | ORAL | Status: AC

## 2011-12-11 NOTE — Patient Instructions (Signed)
Refer to cardiology because he has an enlarged heart, and referred to pulmonology because of the persistence of a pneumonia.  Take the antibiotics and fluid pill as ordered. Keep his appointment with his primary care next week

## 2011-12-11 NOTE — Progress Notes (Signed)
Subjective: This is a 46 year old white male patient who has Down syndrome. He has been here 3 previous visits with a pneumonia over the past few weeks. His mother feels that he is doing better now. He apparently still coughs some, but he has had a cough almost all of his life. Apparently he is not bringing up any visible sputum. He is no longer running any fevers.  Objective: No obvious acute respiratory distress. Throat clear. TMs normal. Neck supple without nodes. Chest is clear to auscultation. Heart regular without murmurs.  Assessment:  Status post pneumonia Down syndrome  Plan  recheck the chest x-ray and proceed accordingly  [facility] reading (PRIMARY) by  Dr. Alwyn Ren Bilateral infiltrate versus CHF pattern associated with cardiomegaly. Does not look any better than his last films, maybe worse.Marland Kitchen  Has appointment with his primary care next week.  Will make referral to both pulmonary and to cardiology. Return here if worse anytime.  Diagnoses: Persistent pneumonia Since cardiomegaly Rule out CHF Down syndrome  Lasix 20 daily. Levaquin 500 daily.

## 2011-12-11 NOTE — Progress Notes (Signed)
  Subjective:    Patient ID: Joseph Williams, male    DOB: 05/24/1966, 46 y.o.   MRN: [id number]  HPI    Review of Systems     Objective:   Physical Exam        Assessment & Plan:

## 2011-12-12 ENCOUNTER — Encounter: Payer: Self-pay | Admitting: Radiology

## 2011-12-16 DIAGNOSIS — J17 Pneumonia in diseases classified elsewhere: Secondary | ICD-10-CM | POA: Diagnosis not present

## 2011-12-16 DIAGNOSIS — Z79899 Other long term (current) drug therapy: Secondary | ICD-10-CM | POA: Diagnosis not present

## 2011-12-16 DIAGNOSIS — I517 Cardiomegaly: Secondary | ICD-10-CM | POA: Diagnosis not present

## 2011-12-16 DIAGNOSIS — B999 Unspecified infectious disease: Secondary | ICD-10-CM | POA: Diagnosis not present

## 2011-12-23 ENCOUNTER — Ambulatory Visit (INDEPENDENT_AMBULATORY_CARE_PROVIDER_SITE_OTHER)
Admission: RE | Admit: 2011-12-23 | Discharge: 2011-12-23 | Disposition: A | Discharge status: Home / Self Care | Payer: Medicare Other | Source: Ambulatory Visit | Attending: Internal Medicine | Admitting: Internal Medicine

## 2011-12-23 ENCOUNTER — Ambulatory Visit (INDEPENDENT_AMBULATORY_CARE_PROVIDER_SITE_OTHER): Payer: Medicare Other | Admitting: Internal Medicine

## 2011-12-23 ENCOUNTER — Encounter: Payer: Self-pay | Admitting: Internal Medicine

## 2011-12-23 ENCOUNTER — Other Ambulatory Visit (INDEPENDENT_AMBULATORY_CARE_PROVIDER_SITE_OTHER): Payer: Medicare Other

## 2011-12-23 VITALS — BP 122/78 | HR 73 | Temp 98.1°F | Ht 64.0 in | Wt 231.4 lb

## 2011-12-23 DIAGNOSIS — R0600 Dyspnea, unspecified: Secondary | ICD-10-CM | POA: Insufficient documentation

## 2011-12-23 DIAGNOSIS — R0989 Other specified symptoms and signs involving the circulatory and respiratory systems: Secondary | ICD-10-CM

## 2011-12-23 DIAGNOSIS — R918 Other nonspecific abnormal finding of lung field: Secondary | ICD-10-CM | POA: Diagnosis not present

## 2011-12-23 DIAGNOSIS — R0609 Other forms of dyspnea: Secondary | ICD-10-CM

## 2011-12-23 DIAGNOSIS — J189 Pneumonia, unspecified organism: Secondary | ICD-10-CM | POA: Diagnosis not present

## 2011-12-23 DIAGNOSIS — R9389 Abnormal findings on diagnostic imaging of other specified body structures: Secondary | ICD-10-CM

## 2011-12-23 LAB — CBC WITH DIFFERENTIAL/PLATELET
Eosinophils Relative: 1 % (ref 0.0–5.0)
Hemoglobin: 14.7 g/dL (ref 13.0–17.0)
Lymphocytes Relative: 25.9 % (ref 12.0–46.0)
Lymphs Abs: 1 10*3/uL (ref 0.7–4.0)
MCHC: 33.7 g/dL (ref 30.0–36.0)
Monocytes Absolute: 0.4 10*3/uL (ref 0.1–1.0)
Monocytes Relative: 10.5 % (ref 3.0–12.0)
Neutro Abs: 2.4 10*3/uL (ref 1.4–7.7)

## 2011-12-23 LAB — BRAIN NATRIURETIC PEPTIDE: Pro B Natriuretic peptide (BNP): 11 pg/mL (ref 0.0–100.0)

## 2011-12-23 IMAGING — CR DG CHEST 2V
2 series · 2 of 2 positions shown · non-contrast
Comparison: [DATE]

CLINICAL DATA: Pneumonia

CHEST - 2 VIEW

[view not recorded (1 of 2)]
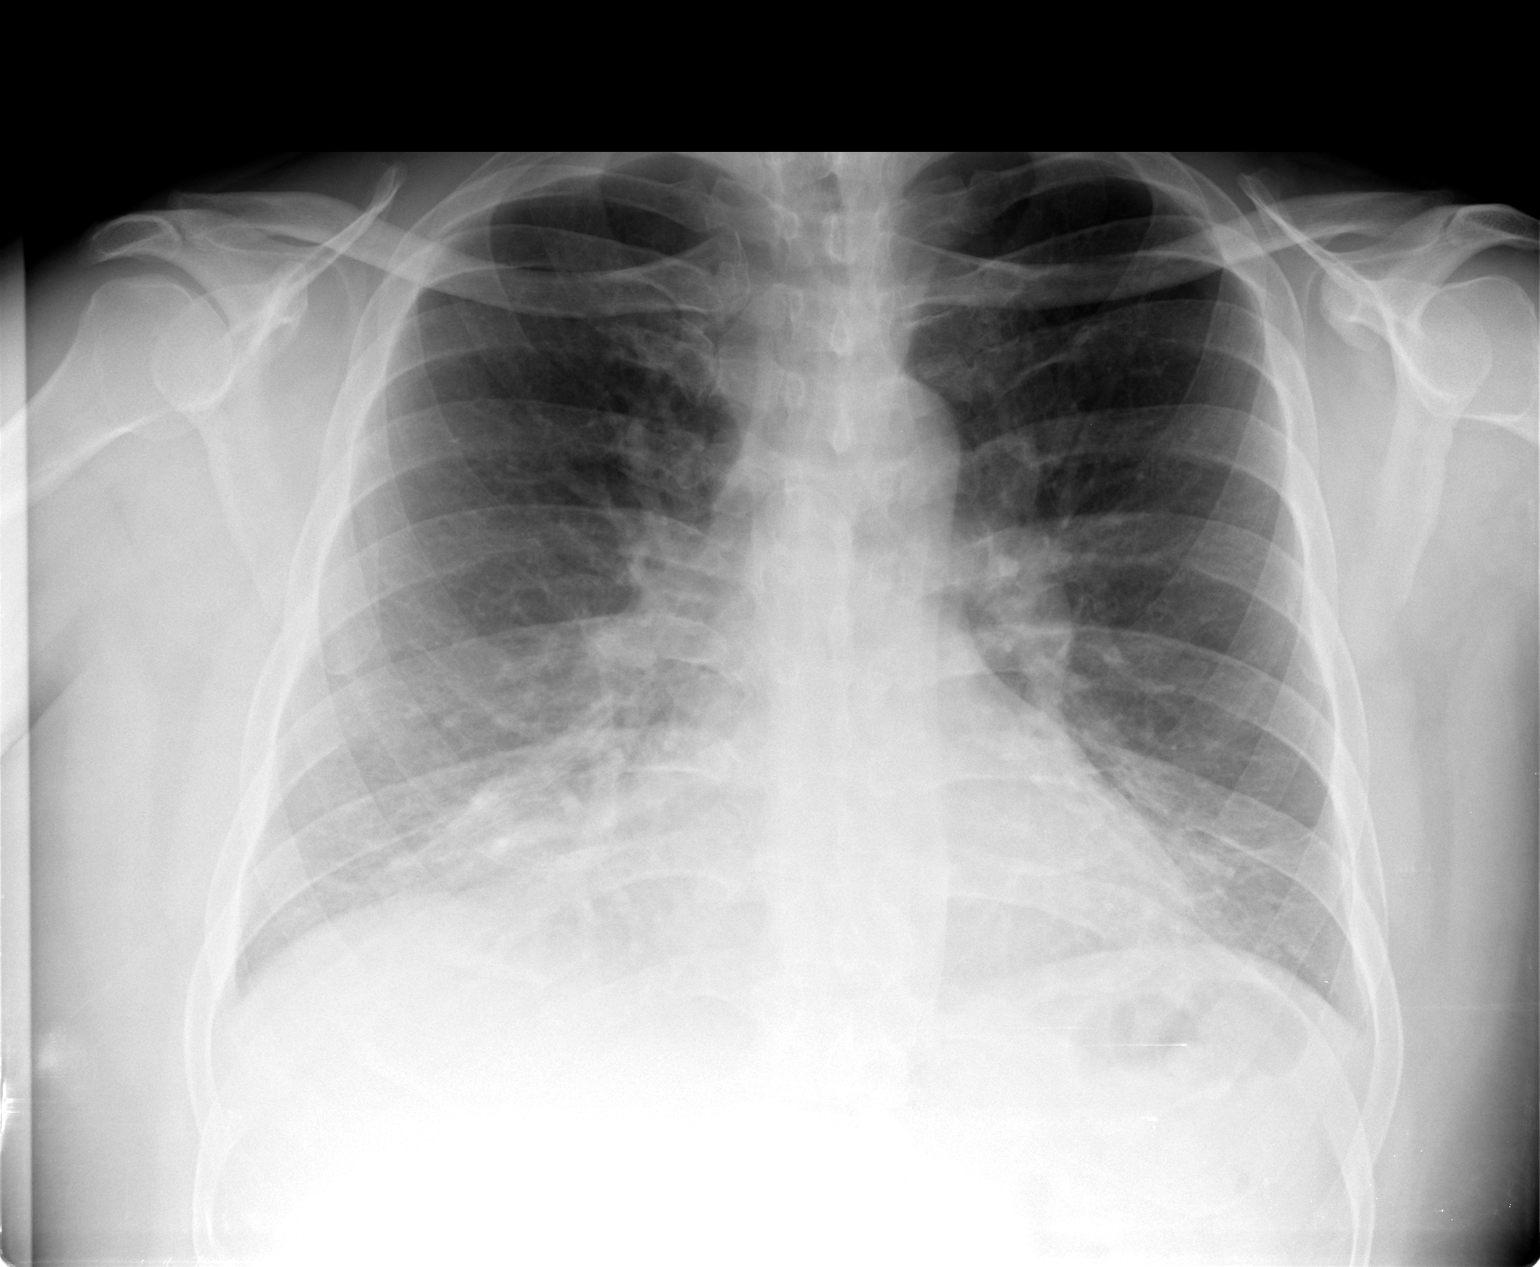

[view not recorded (2 of 2)]
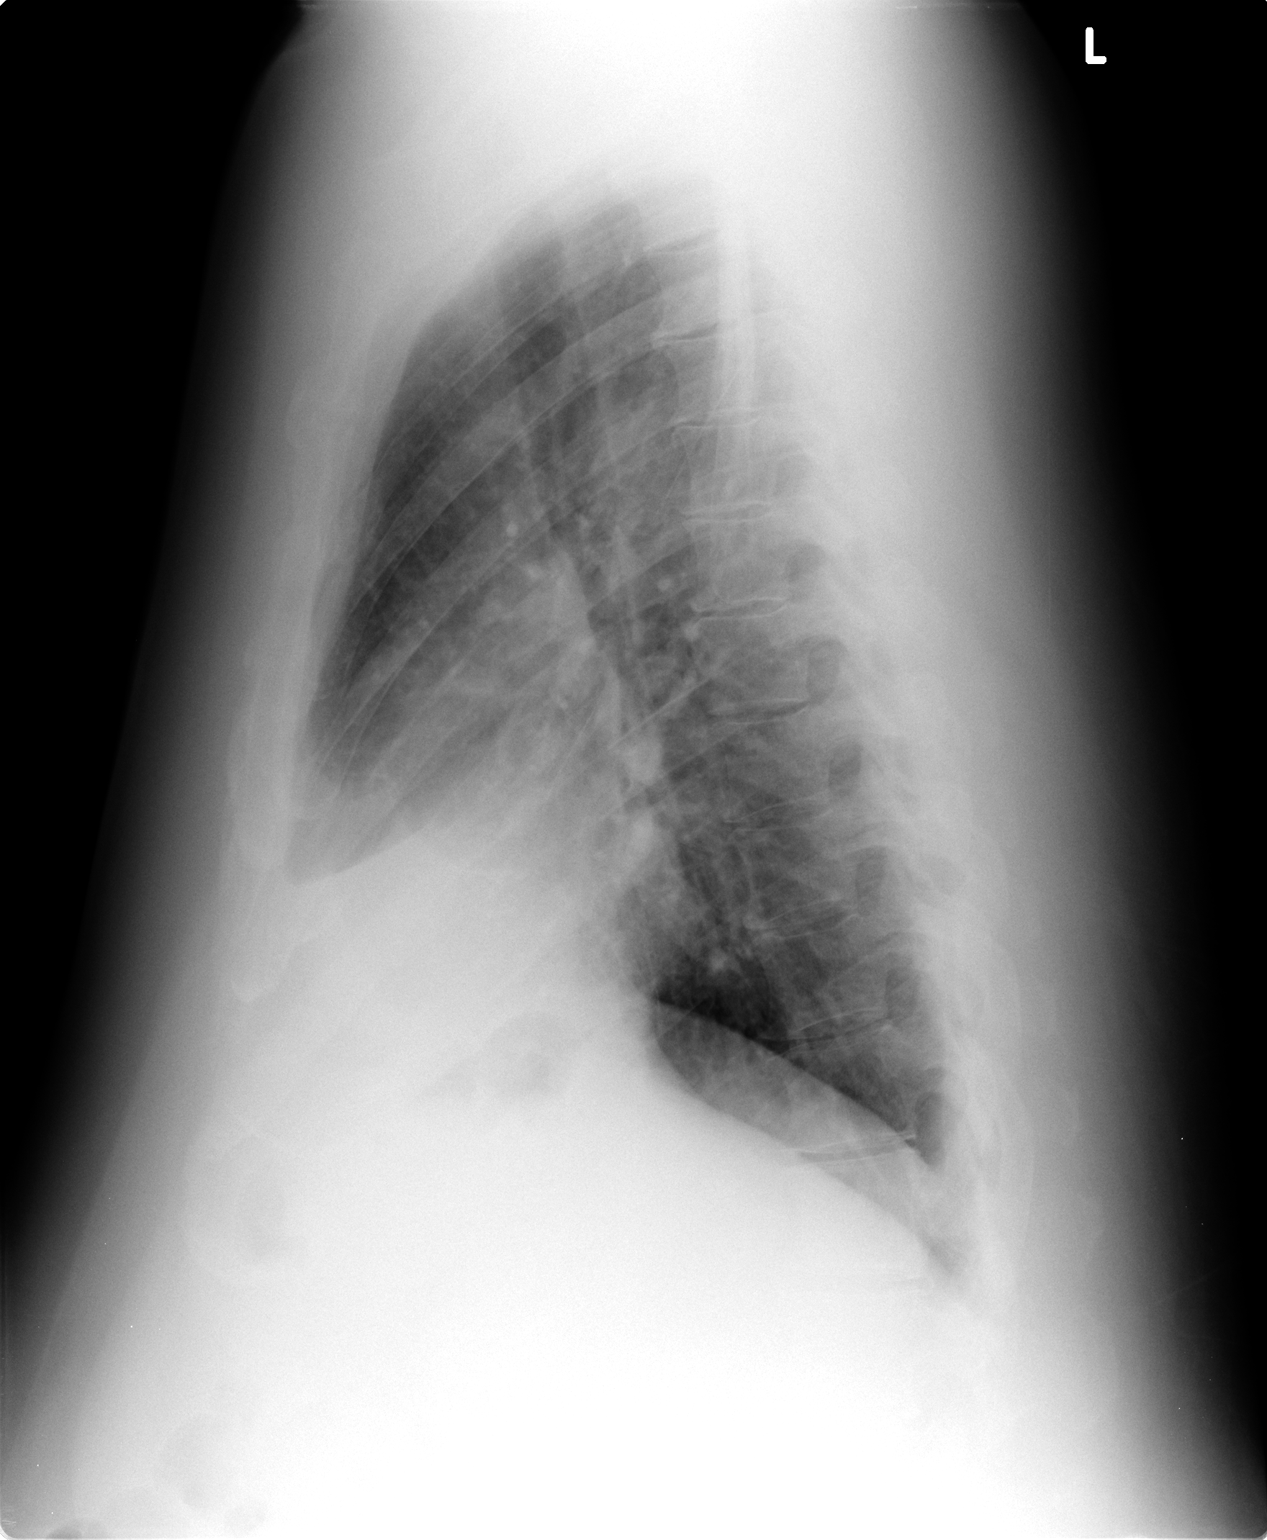

[2 of 2 positions shown; findings below may reference images not displayed]

FINDINGS: Stable bibasilar airspace opacities in the right middle
lobe and lingula.  No pneumothorax.
IMPRESSION: Stable consolidation in the right middle lobe and lingula.

## 2011-12-23 NOTE — Progress Notes (Signed)
  Subjective:    Patient ID: Joseph Williams, male    DOB: Dec 11, 1965   MRN: [id number]  HPI  6 yowm with Down Syndrome with nl echo 2006 and freq  throat clearing since infant then abruptly ill Nov 25 2011 treated as outpt as cap by nurse / mother  At home with Rocephin> omnicef but failed to resolve radiograpically  so referred 12/23/2011 to pulmonary clinic by Dr Alwyn Ren  12/23/2011 1st pulmonary ov cc acute onset nausea/ sob/ fever to 99.3 cough no different rx with reocephin / omnicef/ levaquin > > 100% better by day of ov per mother's perpective. Pt denies any further cough or sob but has chronic throat clearing daytime, no excess or purulent sputum, no problem with eating, no worse p eating, no overt sinus or hb complaints  Sleeping ok without nocturnal  or early am exacerbation  of respiratory  c/o's or need for noct saba. Also denies any obvious fluctuation of symptoms with weather or environmental changes or other aggravating or alleviating factors except as outlined above    Review of Systems  Constitutional: Negative for fever and unexpected weight change.  HENT: Positive for sneezing and dental problem. Negative for ear pain, nosebleeds, congestion, sore throat, rhinorrhea, trouble swallowing, postnasal drip and sinus pressure.   Eyes: Negative for redness and itching.  Respiratory: Positive for cough and shortness of breath. Negative for chest tightness and wheezing.   Cardiovascular: Negative for palpitations and leg swelling.  Gastrointestinal: Negative for nausea and vomiting.  Genitourinary: Negative for dysuria.  Musculoskeletal: Negative for joint swelling.  Skin: Negative for rash.  Neurological: Negative for headaches.  Hematological: Does not bruise/bleed easily.  Psychiatric/Behavioral: Negative for dysphoric mood. The patient is not nervous/anxious.        Objective:   Physical Exam   amb obese wm with classic Down syndrome habitus Wt 231 12/23/2011  HEENT: nl  dentition, turbinates, and orophanx. Nl external ear canals without cough reflex   NECK :  without JVD/Nodes/TM/ nl carotid upstrokes bilaterally   LUNGS: no acc muscle use, A few rhonchi insp and exp bilateral rhonchi = bilaterally, overall movement good.   CV:  RRR  no s3 or murmur or increase in P2, no edema   ABD:  soft and nontender with nl excursion in the supine position. No bruits or organomegaly, bowel sounds nl  MS:  warm without deformities, calf tenderness, cyanosis or clubbing  SKIN: warm and dry without lesions    NEURO:  alert, approp, no deficits      CXR  12/23/2011 :  Stable consolidation in the right middle lobe and lingula.       Assessment & Plan:

## 2011-12-23 NOTE — Assessment & Plan Note (Addendum)
Likely related to body habitus/ conditioning

## 2011-12-23 NOTE — Patient Instructions (Signed)
Please remember to go to the lab and x-ray department downstairs for your tests - we will call you with the results when they are available.    GERD (REFLUX)  is an extremely common cause of respiratory symptoms, many times with no significant heartburn at all.    It can be treated with medication, but also with lifestyle changes including avoidance of late meals, excessive alcohol, smoking cessation, and avoid fatty foods, chocolate, peppermint, colas, red wine, and acidic juices such as orange juice.  NO MINT OR MENTHOL PRODUCTS SO NO COUGH DROPS  USE SUGARLESS CANDY INSTEAD (jolley ranchers or Stover's)  NO OIL BASED VITAMINS - use powdered substitute - stop the fish oil indefinitely since the throat clearing may be a reflux equivalent

## 2011-12-24 LAB — BASIC METABOLIC PANEL
Calcium: 8.4 mg/dL (ref 8.4–10.5)
Creatinine, Ser: 0.9 mg/dL (ref 0.4–1.5)
GFR: 95.23 mL/min (ref >60.00)
Glucose, Bld: 99 mg/dL (ref 70–99)
Sodium: 138 mEq/L (ref 135–145)

## 2011-12-24 NOTE — Progress Notes (Signed)
Quick Note:  Spoke with pt's Mother Janett Billow, and notified of results/recs per MW. She verbalized understanding, and 6 wk rov scheduled for 02/04/12- advised her to call for sooner ov if needed and she verbalized understanding of this. ______

## 2011-12-27 NOTE — Assessment & Plan Note (Signed)
RML/Lingular atx likely on the baslis of previous pna and not active infection the equivalent of a bilateral rml syndrome.  The other concern is aspiration or lipoid pna so def needs gerd diet and elimination of oils from diet in this setting.  Will need regular f/u to assure this is just a radiographic concern and not clinically relevant form of "chronic" or unresolved pna

## 2011-12-31 DIAGNOSIS — I519 Heart disease, unspecified: Secondary | ICD-10-CM | POA: Diagnosis not present

## 2012-01-01 ENCOUNTER — Telehealth: Payer: Self-pay | Admitting: Internal Medicine

## 2012-01-01 NOTE — Telephone Encounter (Signed)
Spoke with pt's Mother and notified of lab results per Dr. Sherene Sires. Pt verbalized understanding and denied any questions.

## 2012-01-12 DIAGNOSIS — I4510 Unspecified right bundle-branch block: Secondary | ICD-10-CM | POA: Diagnosis not present

## 2012-01-12 DIAGNOSIS — Z9289 Personal history of other medical treatment: Secondary | ICD-10-CM

## 2012-01-12 DIAGNOSIS — Q210 Ventricular septal defect: Secondary | ICD-10-CM | POA: Diagnosis not present

## 2012-01-12 HISTORY — DX: Personal history of other medical treatment: Z92.89

## 2012-02-03 ENCOUNTER — Other Ambulatory Visit: Payer: Self-pay | Admitting: Internal Medicine

## 2012-02-03 DIAGNOSIS — J189 Pneumonia, unspecified organism: Secondary | ICD-10-CM

## 2012-02-04 ENCOUNTER — Encounter: Payer: Self-pay | Admitting: Internal Medicine

## 2012-02-04 ENCOUNTER — Ambulatory Visit (INDEPENDENT_AMBULATORY_CARE_PROVIDER_SITE_OTHER): Payer: Medicare Other | Admitting: Internal Medicine

## 2012-02-04 ENCOUNTER — Ambulatory Visit (INDEPENDENT_AMBULATORY_CARE_PROVIDER_SITE_OTHER)
Admission: RE | Admit: 2012-02-04 | Discharge: 2012-02-04 | Disposition: A | Discharge status: Home / Self Care | Payer: Medicare Other | Source: Ambulatory Visit | Attending: Internal Medicine | Admitting: Internal Medicine

## 2012-02-04 VITALS — BP 90/60 | HR 60 | Temp 97.9°F | Ht 62.0 in | Wt 230.8 lb

## 2012-02-04 DIAGNOSIS — Z23 Encounter for immunization: Secondary | ICD-10-CM | POA: Diagnosis not present

## 2012-02-04 DIAGNOSIS — J189 Pneumonia, unspecified organism: Secondary | ICD-10-CM | POA: Diagnosis not present

## 2012-02-04 IMAGING — CR DG CHEST 2V
2 series · 2 of 2 positions shown · non-contrast
Comparison: [DATE]

CLINICAL DATA: Follow up pneumonia

CHEST - 2 VIEW

[view not recorded (1 of 2)]
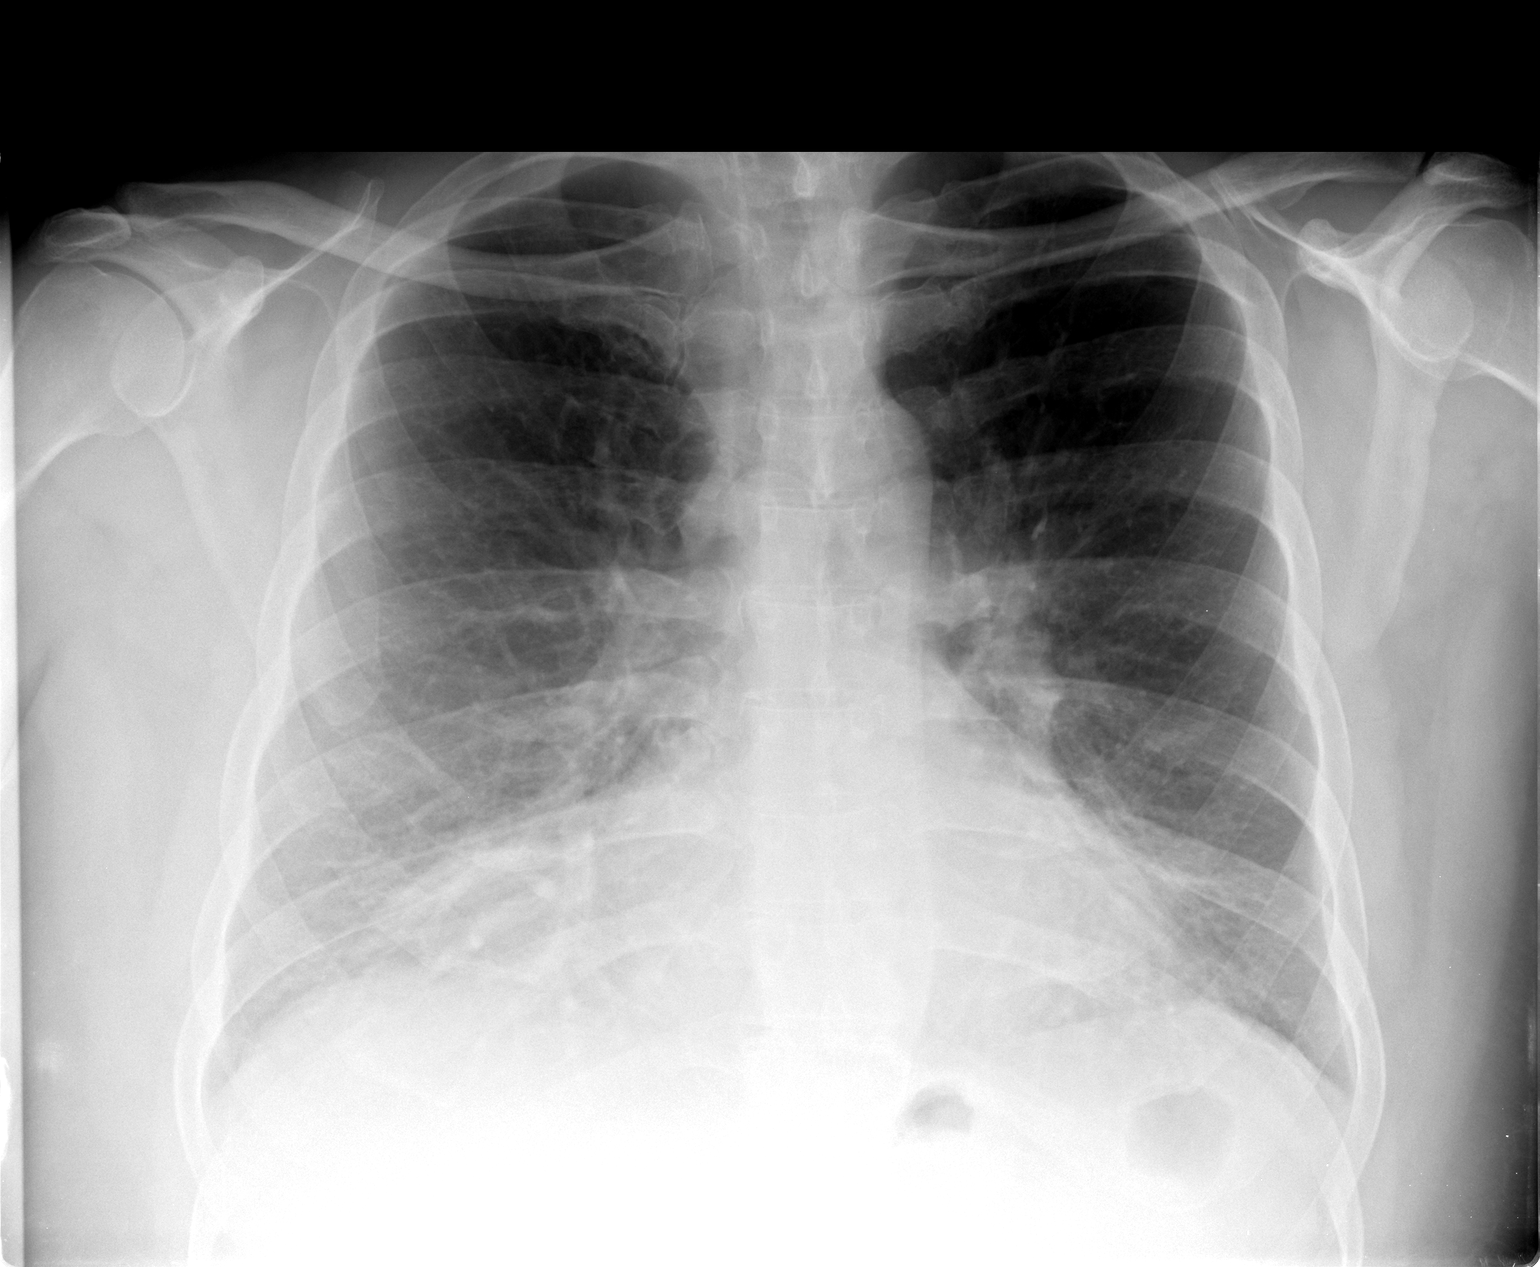

[view not recorded (2 of 2)]
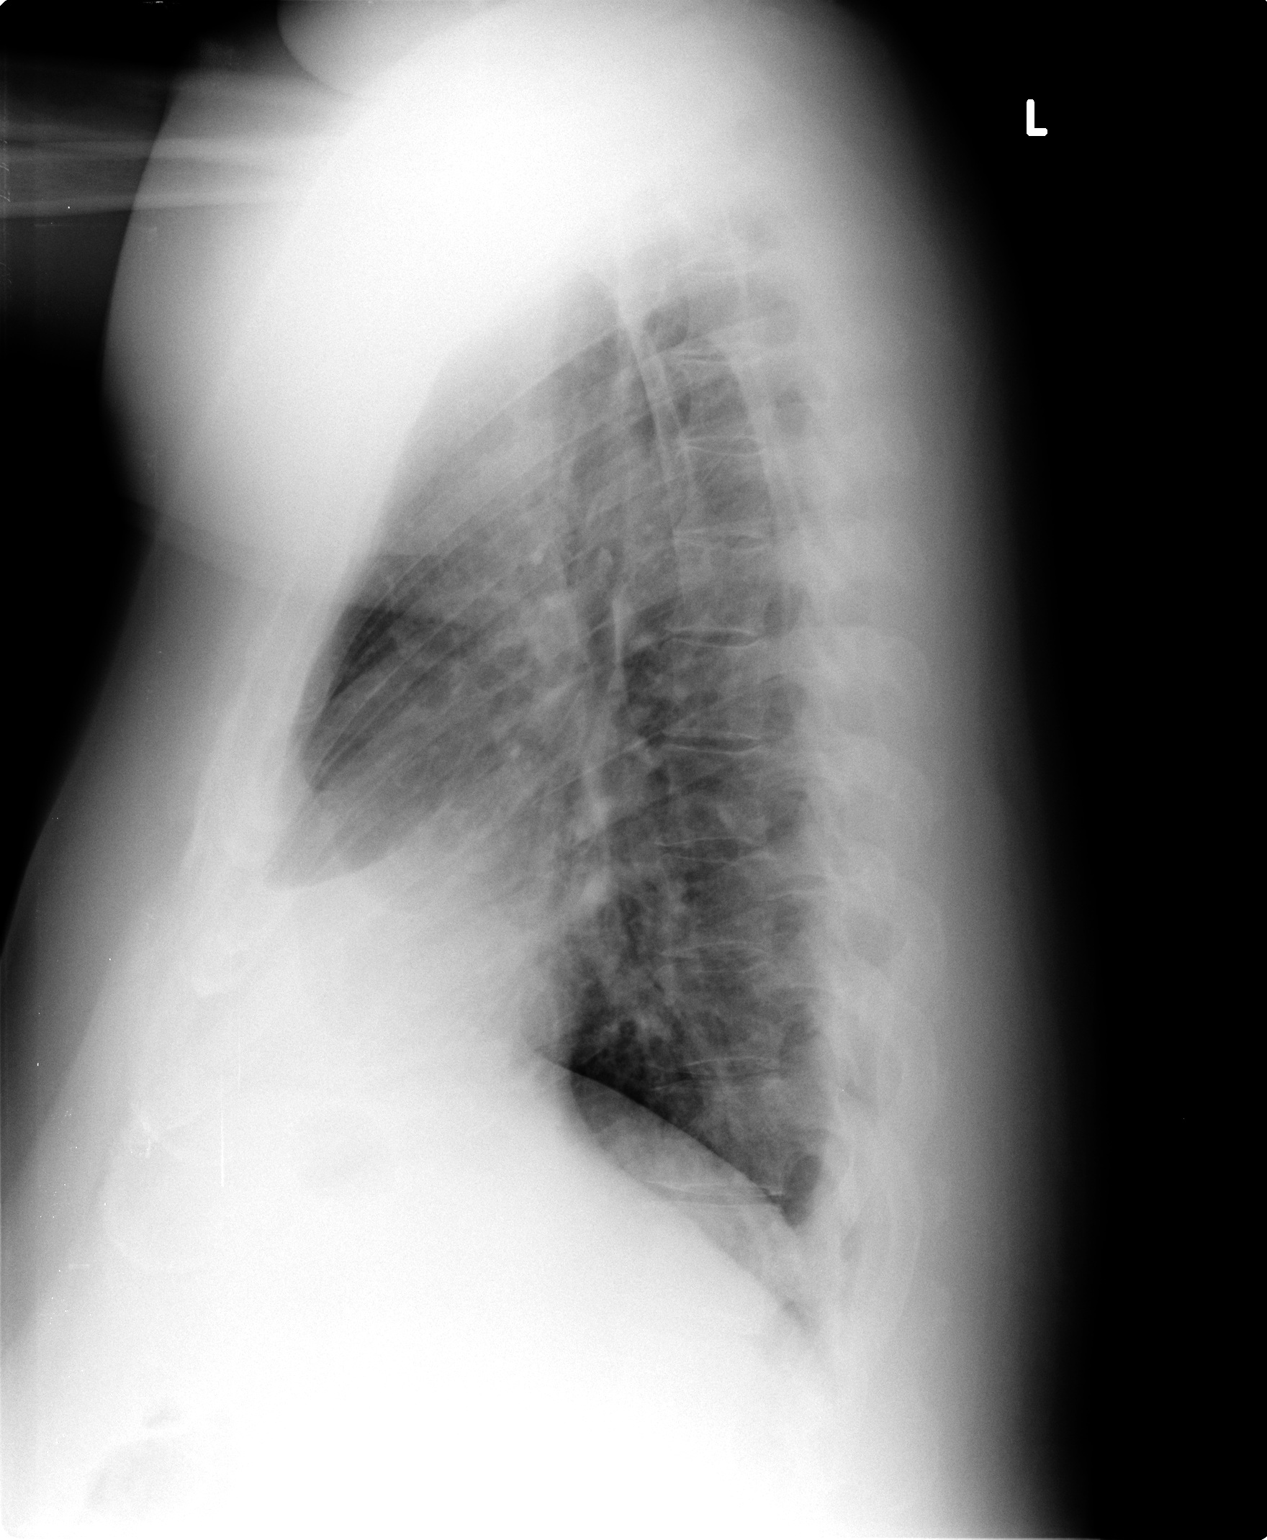

[2 of 2 positions shown; findings below may reference images not displayed]

FINDINGS: Upper normal heart size.
Mediastinal contours and pulmonary vascularity normal.
Chronic right middle lobe opacity.
Minimal atelectasis or infiltrate in lingula persists.
Upper lungs clear.
No pleural effusion or pneumothorax.
IMPRESSION: Persistent right middle lobe and lingular opacities.

## 2012-02-04 NOTE — Progress Notes (Signed)
  Subjective:    Patient ID: Joseph Williams, male    DOB: March 09, 1966   MRN: [id number]  HPI  65 yowm with Down Syndrome with nl echo 2006 and freq  throat clearing since infant then abruptly ill Nov 25 2011 treated as outpt as cap by nurse / mother  At home with Rocephin> omnicef but failed to resolve radiograpically  so referred 12/23/2011 to pulmonary clinic by Dr Alwyn Ren  12/23/2011 1st pulmonary ov cc acute onset nausea/ sob/ fever to 99.3 cough no different rx with reocephin / omnicef/ levaquin > > 100% better by day of ov per mother's perpective. Pt denies any further cough or sob but has chronic throat clearing daytime, no excess or purulent sputum, no problem with eating, no worse p eating, no overt sinus or hb complaints rec Stop fish oil, gerd diet   02/04/2012 f/u ov/Shalaine Payson cc all better, no cough or sob.  Sleeping ok without nocturnal  or early am exacerbation  of respiratory  c/o's or need for noct saba. Also denies any obvious fluctuation of symptoms with weather or environmental changes or other aggravating or alleviating factors except as outlined above.  ROS  At present neg for  any significant sore throat, dysphagia, dental problems, itching, sneezing,  nasal congestion or excess/ purulent secretions, ear ache,   fever, chills, sweats, unintended wt loss, pleuritic or exertional cp, hemoptysis, palpitations, orthopnea pnd or leg swelling.  Also denies presyncope, palpitations, heartburn, abdominal pain, anorexia, nausea, vomiting, diarrhea  or change in bowel or urinary habits, change in stools or urine, dysuria,hematuria,  rash, arthralgias, visual complaints, headache, numbness weakness or ataxia or problems with walking or coordination. No noted change in mood/affect or memory.                        Objective:   Physical Exam   amb obese wm with classic Down syndrome habitus stoic, pleasant demeanor  Wt 231 12/23/2011  > 02/04/2012  230  HEENT: nl dentition,  turbinates, and orophanx. Nl external ear canals without cough reflex   NECK :  without JVD/Nodes/TM/ nl carotid upstrokes bilaterally   LUNGS: no acc muscle use, A few rhonchi insp and exp bilateral rhonchi = bilaterally, overall movement good.   CV:  RRR  no s3 or murmur or increase in P2, no edema   ABD:  soft and nontender with nl excursion in the supine position. No bruits or organomegaly, bowel sounds nl  MS:  warm without deformities, calf tenderness, cyanosis or clubbing         CXR  12/23/2011 :  Stable consolidation in the right middle lobe and lingula.  CXR  02/04/2012 :  Persistent right middle lobe and lingular opacities.       Assessment & Plan:

## 2012-02-04 NOTE — Patient Instructions (Signed)
For cough or congestion robitussin or mucinex as needed   Fish better choice than fish oil.  Pneumonia shot today and again 2018  Return as needed

## 2012-02-05 NOTE — Assessment & Plan Note (Signed)
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

## 2012-07-16 DIAGNOSIS — Z23 Encounter for immunization: Secondary | ICD-10-CM | POA: Diagnosis not present

## 2012-11-05 DIAGNOSIS — S60219A Contusion of unspecified wrist, initial encounter: Secondary | ICD-10-CM | POA: Diagnosis not present

## 2012-11-10 ENCOUNTER — Encounter: Payer: Self-pay | Admitting: *Deleted

## 2013-03-25 DIAGNOSIS — E782 Mixed hyperlipidemia: Secondary | ICD-10-CM | POA: Diagnosis not present

## 2013-03-25 DIAGNOSIS — Z79899 Other long term (current) drug therapy: Secondary | ICD-10-CM | POA: Diagnosis not present

## 2013-06-21 DIAGNOSIS — H5231 Anisometropia: Secondary | ICD-10-CM | POA: Diagnosis not present

## 2013-06-21 DIAGNOSIS — H52229 Regular astigmatism, unspecified eye: Secondary | ICD-10-CM | POA: Diagnosis not present

## 2013-06-21 DIAGNOSIS — H25019 Cortical age-related cataract, unspecified eye: Secondary | ICD-10-CM | POA: Diagnosis not present

## 2013-07-01 DIAGNOSIS — E559 Vitamin D deficiency, unspecified: Secondary | ICD-10-CM | POA: Diagnosis not present

## 2013-07-01 DIAGNOSIS — Z79899 Other long term (current) drug therapy: Secondary | ICD-10-CM | POA: Diagnosis not present

## 2013-07-01 DIAGNOSIS — E7800 Pure hypercholesterolemia, unspecified: Secondary | ICD-10-CM | POA: Diagnosis not present

## 2013-07-01 DIAGNOSIS — E782 Mixed hyperlipidemia: Secondary | ICD-10-CM | POA: Diagnosis not present

## 2013-07-04 DIAGNOSIS — Z23 Encounter for immunization: Secondary | ICD-10-CM | POA: Diagnosis not present

## 2013-08-19 DIAGNOSIS — Z6841 Body Mass Index (BMI) 40.0 and over, adult: Secondary | ICD-10-CM | POA: Diagnosis not present

## 2013-08-19 DIAGNOSIS — Z481 Encounter for planned postprocedural wound closure: Secondary | ICD-10-CM | POA: Diagnosis not present

## 2013-08-19 DIAGNOSIS — E7800 Pure hypercholesterolemia, unspecified: Secondary | ICD-10-CM | POA: Diagnosis not present

## 2013-08-19 DIAGNOSIS — Z79899 Other long term (current) drug therapy: Secondary | ICD-10-CM | POA: Diagnosis not present

## 2013-08-19 DIAGNOSIS — E782 Mixed hyperlipidemia: Secondary | ICD-10-CM | POA: Diagnosis not present

## 2013-08-19 DIAGNOSIS — Z0000 Encounter for general adult medical examination without abnormal findings: Secondary | ICD-10-CM | POA: Diagnosis not present

## 2013-12-13 ENCOUNTER — Encounter (HOSPITAL_BASED_OUTPATIENT_CLINIC_OR_DEPARTMENT_OTHER): Payer: Self-pay | Admitting: Emergency Medicine

## 2013-12-13 ENCOUNTER — Emergency Department (HOSPITAL_BASED_OUTPATIENT_CLINIC_OR_DEPARTMENT_OTHER): Payer: Medicare Other

## 2013-12-13 ENCOUNTER — Emergency Department (HOSPITAL_BASED_OUTPATIENT_CLINIC_OR_DEPARTMENT_OTHER)
Admission: EM | Admit: 2013-12-13 | Discharge: 2013-12-13 | Disposition: A | Discharge status: Home / Self Care | Payer: Medicare Other | Attending: Emergency Medicine | Admitting: Emergency Medicine

## 2013-12-13 DIAGNOSIS — Z7982 Long term (current) use of aspirin: Secondary | ICD-10-CM | POA: Insufficient documentation

## 2013-12-13 DIAGNOSIS — Z79899 Other long term (current) drug therapy: Secondary | ICD-10-CM | POA: Diagnosis not present

## 2013-12-13 DIAGNOSIS — I998 Other disorder of circulatory system: Secondary | ICD-10-CM | POA: Diagnosis not present

## 2013-12-13 DIAGNOSIS — Q909 Down syndrome, unspecified: Secondary | ICD-10-CM | POA: Insufficient documentation

## 2013-12-13 DIAGNOSIS — W1809XA Striking against other object with subsequent fall, initial encounter: Secondary | ICD-10-CM | POA: Insufficient documentation

## 2013-12-13 DIAGNOSIS — R404 Transient alteration of awareness: Secondary | ICD-10-CM | POA: Diagnosis not present

## 2013-12-13 DIAGNOSIS — Y9289 Other specified places as the place of occurrence of the external cause: Secondary | ICD-10-CM | POA: Insufficient documentation

## 2013-12-13 DIAGNOSIS — T17308A Unspecified foreign body in larynx causing other injury, initial encounter: Secondary | ICD-10-CM

## 2013-12-13 DIAGNOSIS — E7800 Pure hypercholesterolemia, unspecified: Secondary | ICD-10-CM | POA: Insufficient documentation

## 2013-12-13 DIAGNOSIS — Z8701 Personal history of pneumonia (recurrent): Secondary | ICD-10-CM | POA: Diagnosis not present

## 2013-12-13 DIAGNOSIS — S0990XA Unspecified injury of head, initial encounter: Secondary | ICD-10-CM | POA: Diagnosis not present

## 2013-12-13 DIAGNOSIS — R0989 Other specified symptoms and signs involving the circulatory and respiratory systems: Secondary | ICD-10-CM | POA: Diagnosis not present

## 2013-12-13 DIAGNOSIS — R55 Syncope and collapse: Secondary | ICD-10-CM | POA: Diagnosis not present

## 2013-12-13 DIAGNOSIS — R0609 Other forms of dyspnea: Secondary | ICD-10-CM | POA: Insufficient documentation

## 2013-12-13 DIAGNOSIS — R6889 Other general symptoms and signs: Secondary | ICD-10-CM | POA: Diagnosis not present

## 2013-12-13 DIAGNOSIS — W108XXA Fall (on) (from) other stairs and steps, initial encounter: Secondary | ICD-10-CM | POA: Insufficient documentation

## 2013-12-13 DIAGNOSIS — Y9389 Activity, other specified: Secondary | ICD-10-CM | POA: Insufficient documentation

## 2013-12-13 IMAGING — CT CT MAXILLOFACIAL W/O CM
4 series · 17 of 47 positions shown, 19 images · non-contrast
Comparison: none

[Series 2: head 4.8 h37s · axial · 0.44mm/px · z∈[+1367,+1503]mm · 8 of 36 slices shown, 10 images]
[im 4/36  brain]
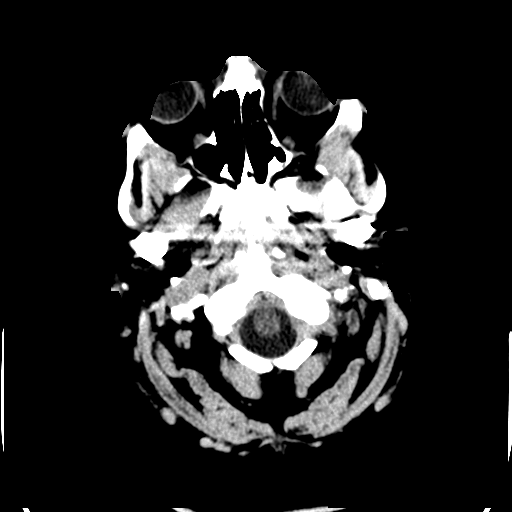
[im 4/36  bone]
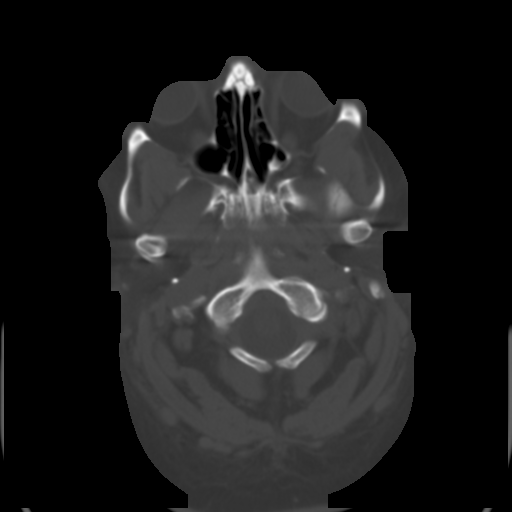
[im 8/36  bone]
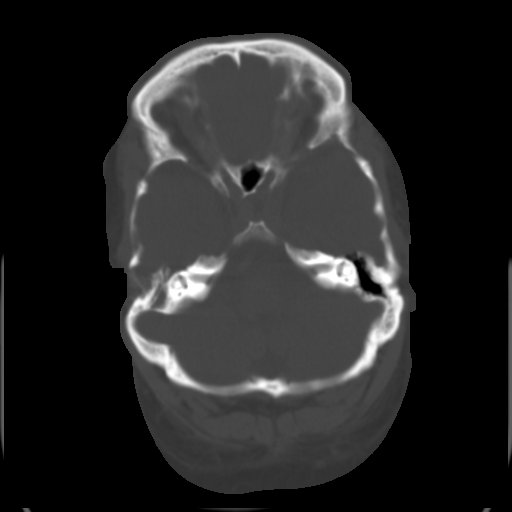
[im 12/36  bone]
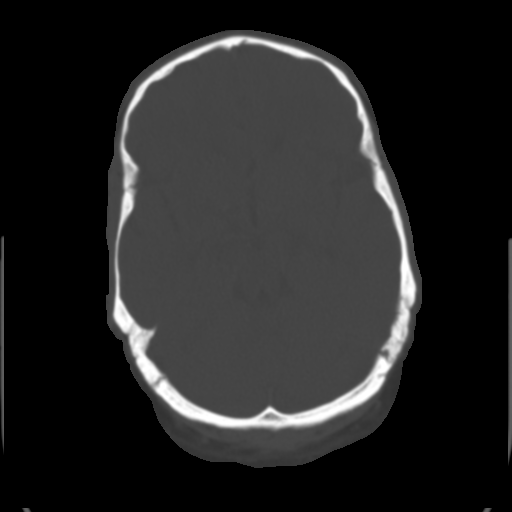
[im 16/36  bone]
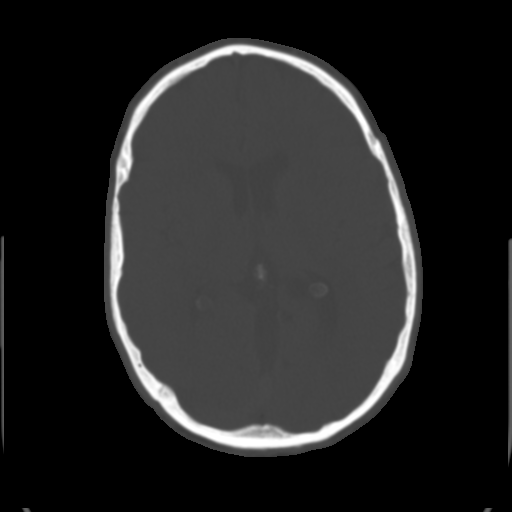
[im 20/36  brain]
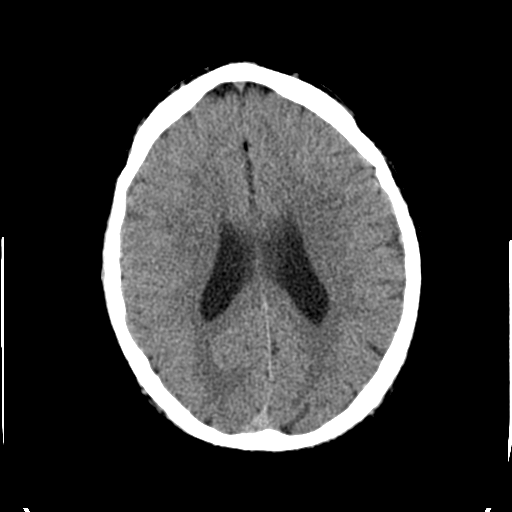
[im 20/36  bone]
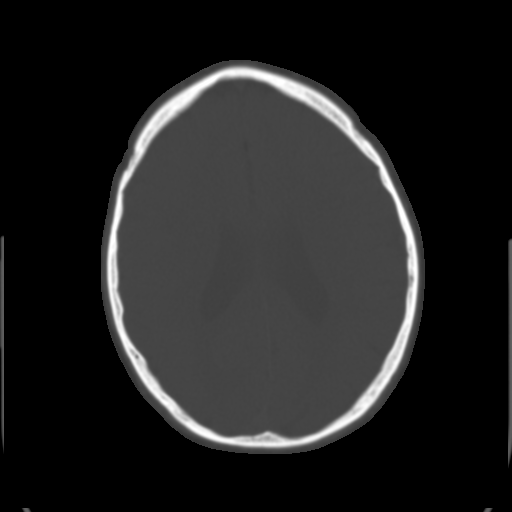
[im 24/36  bone]
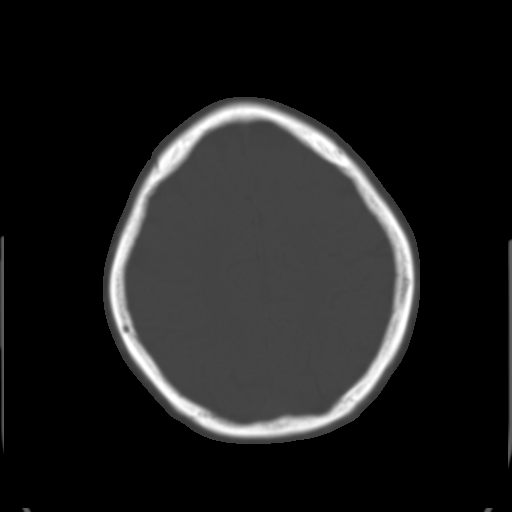
[im 28/36  bone]
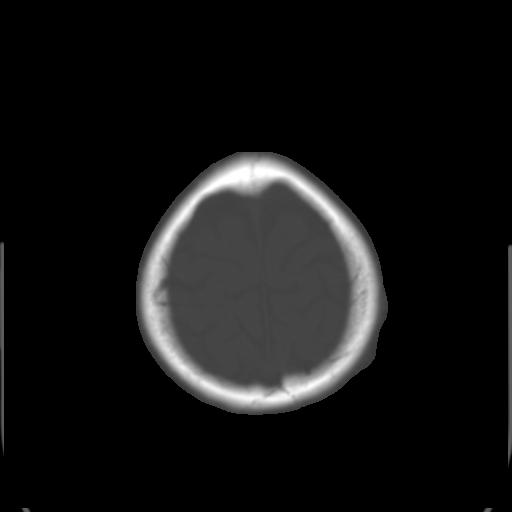
[im 32/36  bone]
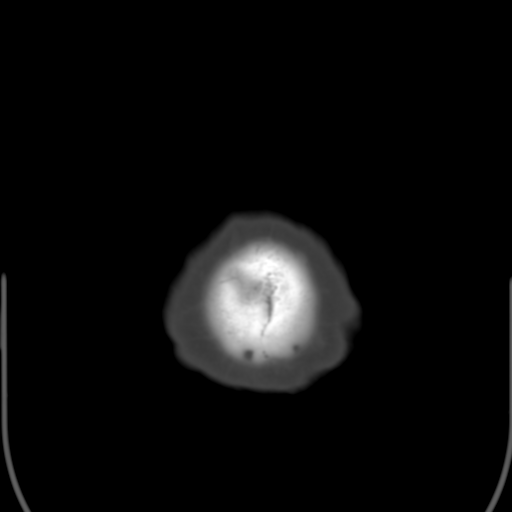

[Series 5: face/orbit 2.0 h30s st · axial · 0.32mm/px · z∈[+1270,+1298]mm · 3 of 68 slices shown]
[im 8/68  bone]
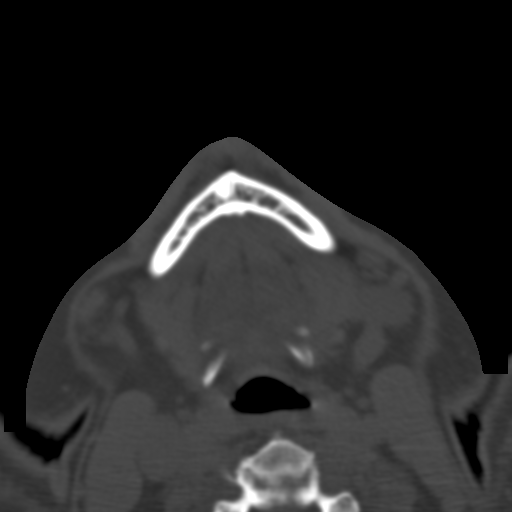
[im 15/68  bone]
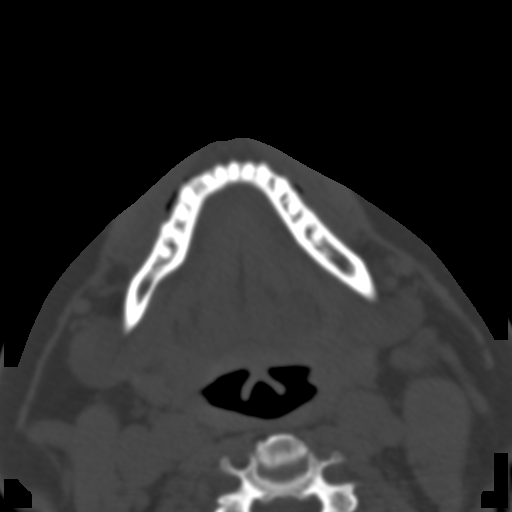
[im 22/68  bone]
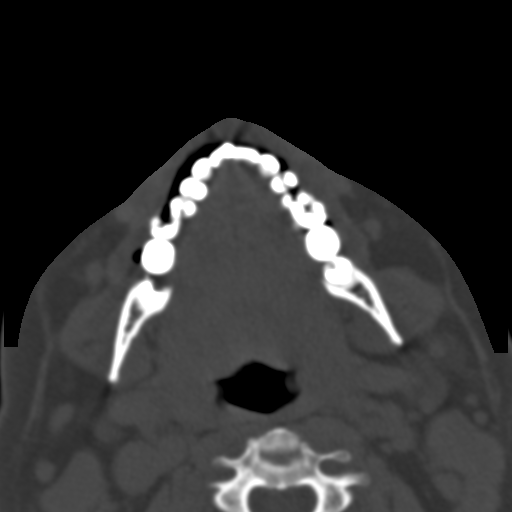

[Series 8: face/orbit 2.0 coronal · coronal · 0.31mm/px · 3 of 69 slices shown]
[im 23/69  bone]
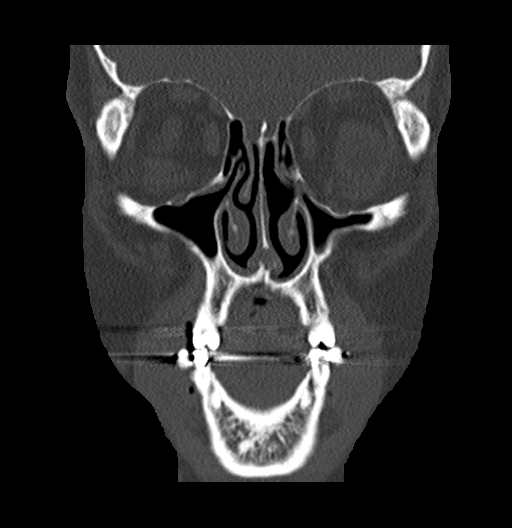
[im 31/69  bone]
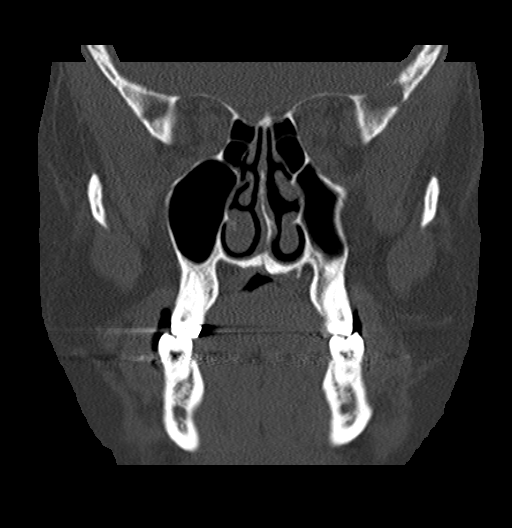
[im 38/69  bone]
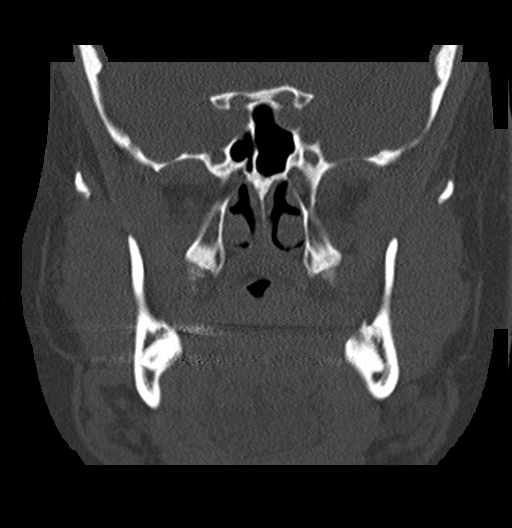

[Series 9: face/orbit 2.0 sagittal · sagittal · 0.30mm/px · 3 of 77 slices shown]
[im 26/77  bone]
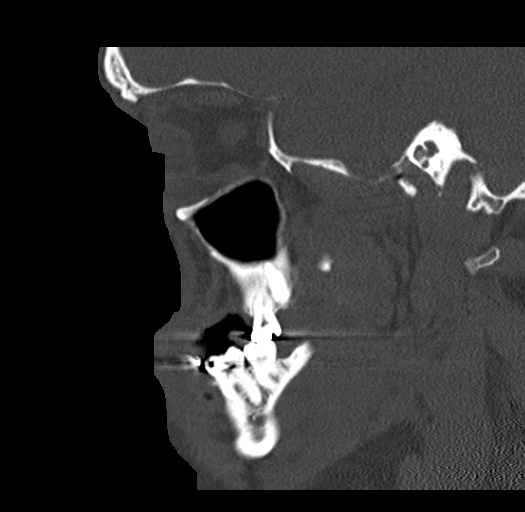
[im 39/77  bone]
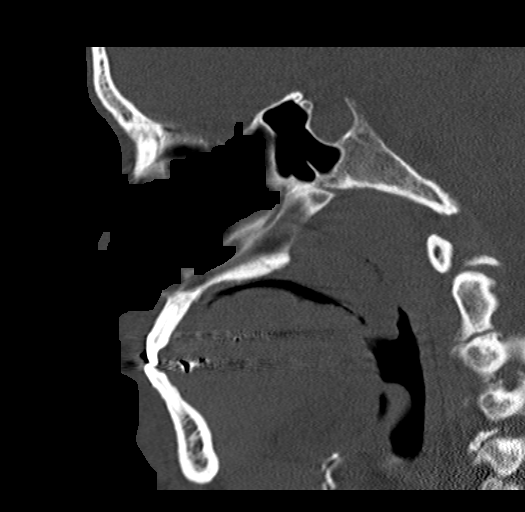
[im 51/77  bone]
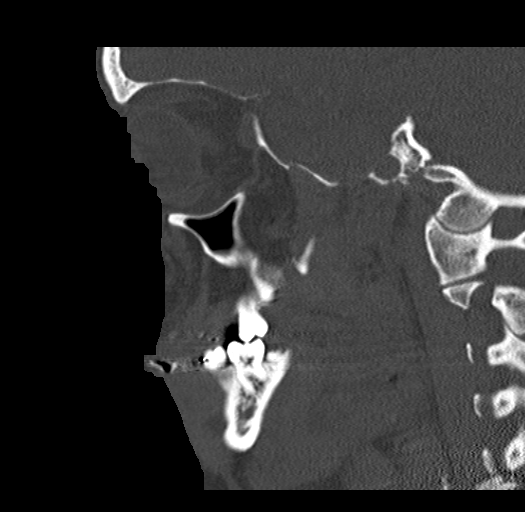

[17 of 47 positions shown; findings below may reference images not displayed]

CLINICAL DATA
Right lateral orbital ecchymosis, edema

EXAM
CT HEAD WITHOUT CONTRAST

CT MAXILLOFACIAL WITHOUT CONTRAST

TECHNIQUE
Multidetector CT imaging of the head and maxillofacial structures
were performed using the standard protocol without intravenous
contrast. Multiplanar CT image reconstructions of the maxillofacial
structures were also generated.

COMPARISON
None.

FINDINGS
CT HEAD FINDINGS

There is no evidence of mass effect, midline shift or extra-axial
fluid collections. There is no evidence of a space-occupying lesion
or intracranial hemorrhage. There is no evidence of a cortical-based
area of acute infarction. Bilateral basal ganglia calcifications are
noted.

The ventricles and sulci are appropriate for the patient's age. The
basal cisterns are patent.

Visualized portions of the orbits are unremarkable. The visualized
portions of the paranasal sinuses and mastoid air cells are
unremarkable.

The osseous structures are unremarkable.

CT MAXILLOFACIAL FINDINGS

There is mild soft tissue swelling overlying the left zygoma. The
globes are intact. The orbital walls are intact. The orbital floors
are intact. The maxilla is intact. The mandible is intact. The
zygomatic arches are intact. The nasal septum is midline. There is
no nasal bone fracture. The temporomandibular joints suboptimally
evaluated secondary to patient motion.

There is mild left maxillary sinus mucosal thickening. The
visualized portions of the mastoid sinuses are well aerated.

IMPRESSION
1. No acute intracranial pathology.
2. No acute osseous injury of the maxillofacial bones.

SIGNATURE

## 2013-12-13 IMAGING — CR DG CHEST 2V
2 series · 2 of 2 positions shown · non-contrast
Comparison: none

[w chest pa]
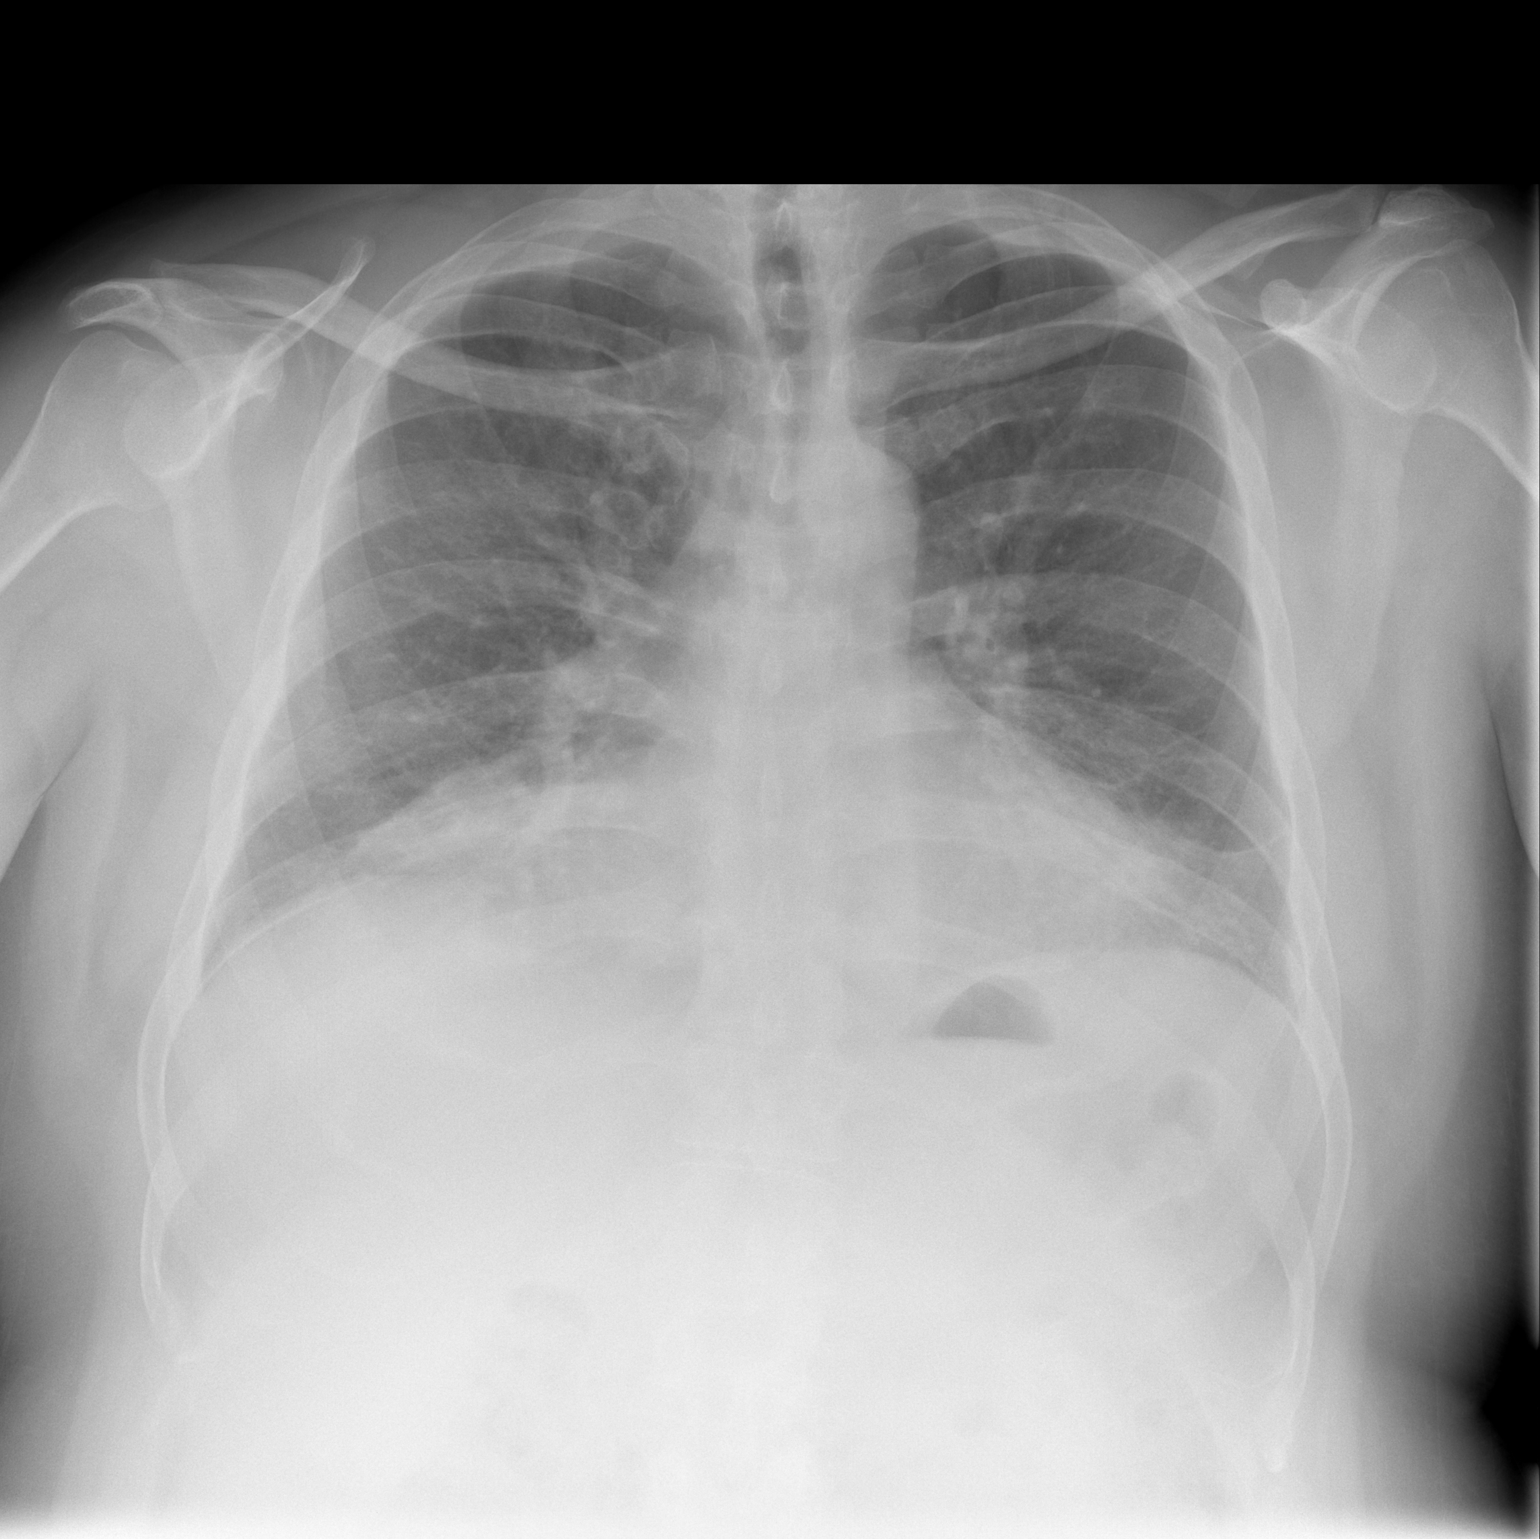

[w chest lat]
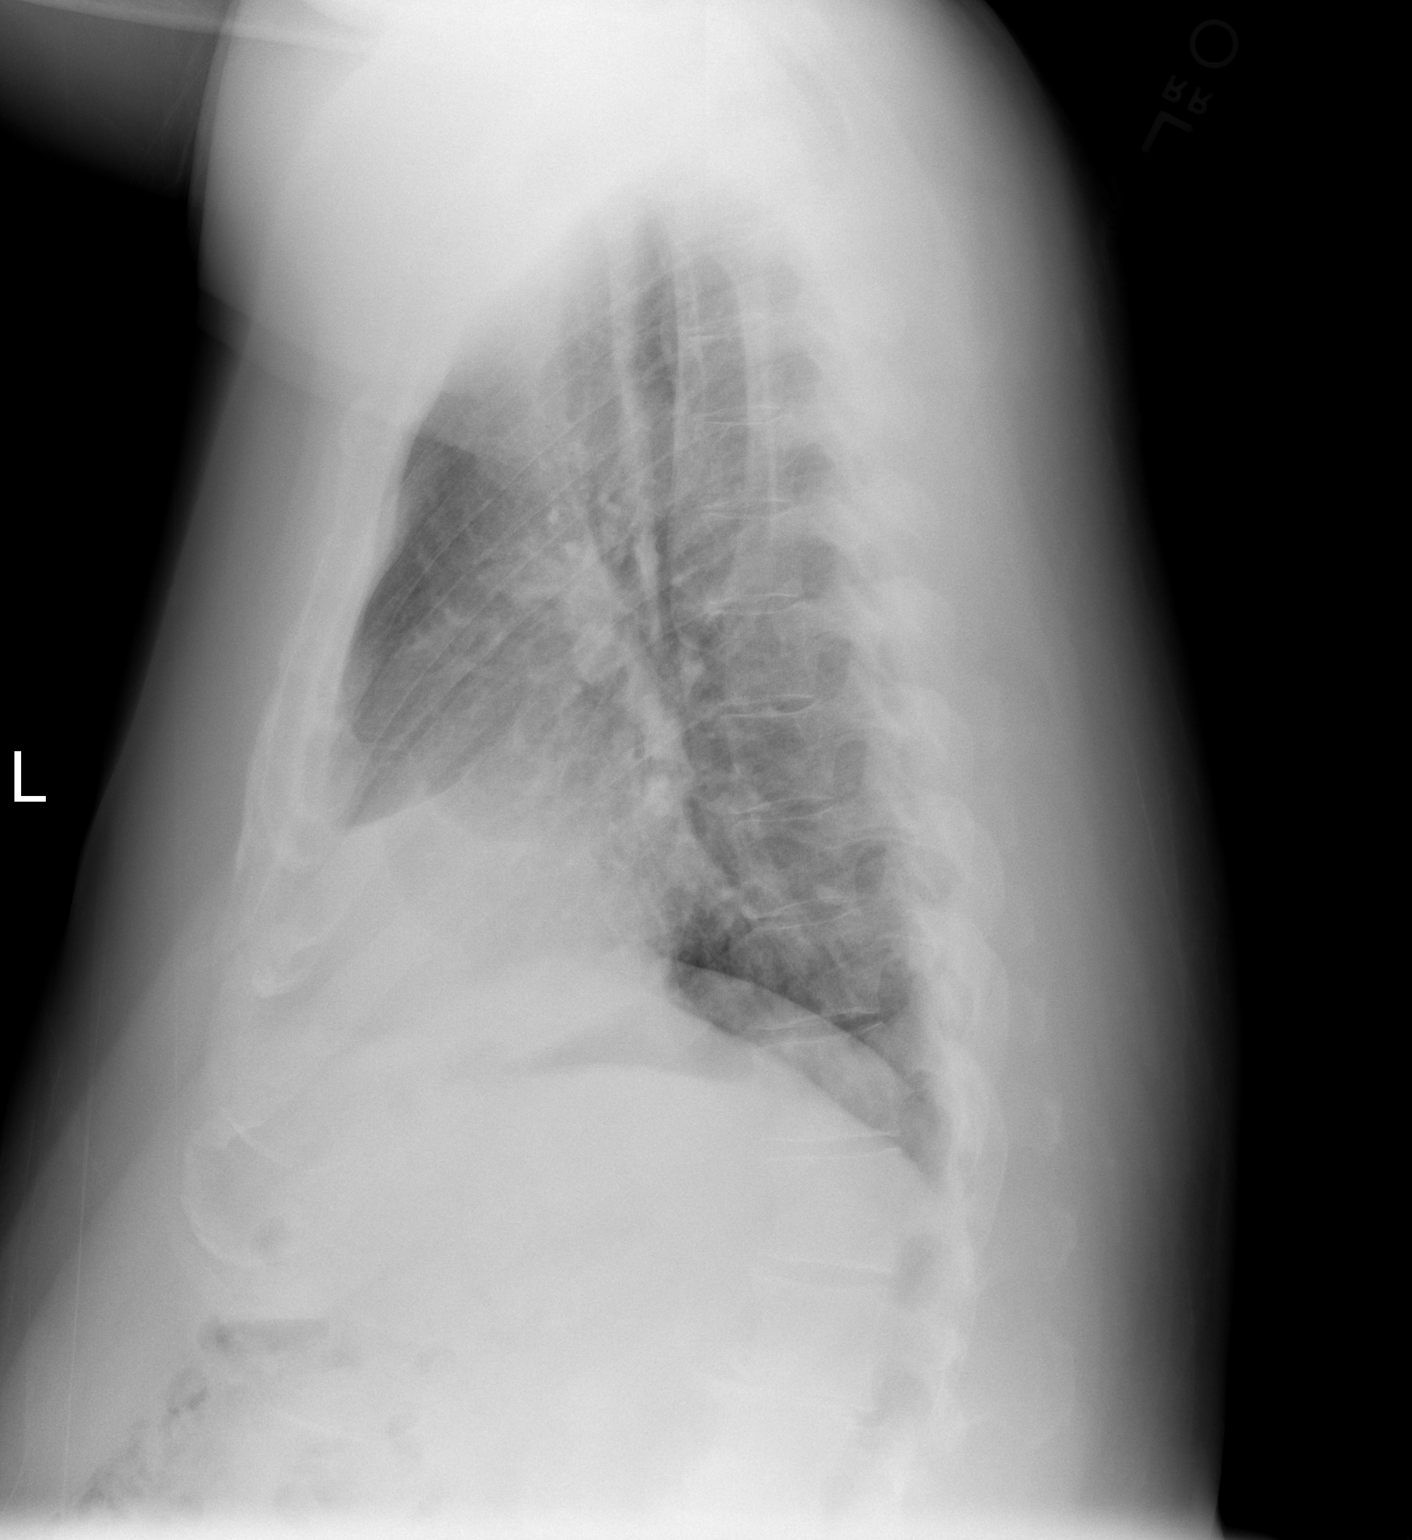

[2 of 2 positions shown; findings below may reference images not displayed]

CLINICAL DATA
Choking episode

EXAM
CHEST  2 VIEW

COMPARISON

FINDINGS
The heart size and mediastinal contours are stable. The heart size
is probably mildly enlarged. Both lungs are clear. There prominent
pericardial fat pads bilaterally stable compared to prior exam of
[DATE]. The visualized skeletal structures are unremarkable.

IMPRESSION
No active cardiopulmonary disease.

SIGNATURE

## 2013-12-13 NOTE — ED Notes (Signed)
Mother reports pt choked on peanut butter and crackers-pt became unresponsive and hit head in BR at home-mother performed heimlich maneuver-pt cough up food-pt currently A/O-NAD

## 2013-12-13 NOTE — ED Provider Notes (Signed)
CSN: [id number]     Arrival date & time 12/13/13  1512 History   First MD Initiated Contact with Patient 12/13/13 1527     Chief Complaint  Patient presents with  . Choking     (Consider location/radiation/quality/duration/timing/severity/associated sxs/prior Treatment) HPI Comments: Patient was eating peanut butter and crackers and began having difficulty breathing. Mother, who is at bedside, stated he ran upstairs and passed out. He hit his head on the bathroom counter while falling. He was blue when his mother found him and she performed abdominal thrusts. He was able to begin breathing again and regained consciousness and coughed out the rest of his peanut butter and crackers. Back to baseline now.  Patient is a 48 y.o. male presenting with shortness of breath. The history is provided by the patient.  Shortness of Breath Severity:  Severe Onset quality:  Sudden Timing:  Constant Progression:  Resolved Chronicity:  New Context comment:  Eating peanut butter and crackers, began choking Relieved by: mother doing abdominal thrusts. Worsened by:  Nothing tried Associated symptoms: syncope   Associated symptoms: no fever     Past Medical History  Diagnosis Date  . High cholesterol   . Down's syndrome   . Persistent pneumonia   . H/O echocardiogram 01/12/2012    Normal left ventricular wall thickness; Left ventricular systolic function is normal;  there is trace mitral and tricuspid regurgitation. EF 74%   Past Surgical History  Procedure Laterality Date  . Hernia repair  2006    bilateal inguinal and ventral   Family History  Problem Relation Age of Onset  . Heart attack Father   . Breast cancer Mother     x 3  . Throat cancer Maternal Grandfather    History  Substance Use Topics  . Smoking status: Never Smoker   . Smokeless tobacco: Never Used  . Alcohol Use: No    Review of Systems  Constitutional: Negative for fever.  Respiratory: Positive for shortness of  breath.   Cardiovascular: Positive for syncope.  All other systems reviewed and are negative.      Allergies  Review of patient's allergies indicates no known allergies.  Home Medications   Current Outpatient Rx  Name  Route  Sig  Dispense  Refill  . Rosuvastatin Calcium (CRESTOR PO)   Oral   Take by mouth.         Marland Kitchen aspirin 81 MG tablet   Oral   Take 81 mg by mouth daily.         . cholecalciferol (VITAMIN D) 1000 UNITS tablet   Oral   Take 1,000 Units by mouth daily.         . Cyanocobalamin (B-12) 1000 MCG CAPS   Oral   Take by mouth daily.         . simvastatin (ZOCOR) 40 MG tablet   Oral   Take 40 mg by mouth every evening.          BP 137/83  Pulse 81  Temp(Src) 98 F (36.7 C) (Oral)  Resp 20  Ht 5\' 2"  (1.575 m)  Wt 230 lb (104.327 kg)  BMI 42.06 kg/m2  SpO2 97% Physical Exam  Constitutional: He is oriented to person, place, and time. He appears well-developed and well-nourished. No distress.  HENT:  Head: Normocephalic.    Mouth/Throat: No oropharyngeal exudate.  Eyes: EOM are normal. Pupils are equal, round, and reactive to light.  Neck: Normal range of motion. Neck supple.  Cardiovascular: Normal  rate and regular rhythm.  Exam reveals no friction rub.   No murmur heard. Pulmonary/Chest: Effort normal and breath sounds normal. No respiratory distress. He has no wheezes. He has no rales.  Abdominal: He exhibits no distension. There is no tenderness. There is no rebound.  Musculoskeletal: Normal range of motion. He exhibits no edema.  Neurological: He is alert and oriented to person, place, and time.  Skin: He is not diaphoretic.    ED Course  Procedures (including critical care time) Labs Review Labs Reviewed - No data to display Imaging Review Dg Chest 2 View  12/13/2013   CLINICAL DATA Choking episode  EXAM CHEST  2 VIEW  COMPARISON Feb 04, 2012  FINDINGS The heart size and mediastinal contours are stable. The heart size is  probably mildly enlarged. Both lungs are clear. There prominent pericardial fat pads bilaterally stable compared to prior exam of May 2013. The visualized skeletal structures are unremarkable.  IMPRESSION No active cardiopulmonary disease.  SIGNATURE  Electronically Signed   By: Abelardo Diesel M.D.   On: 12/13/2013 16:09   Ct Head Wo Contrast  12/13/2013   CLINICAL DATA Right lateral orbital ecchymosis, edema  EXAM CT HEAD WITHOUT CONTRAST  CT MAXILLOFACIAL WITHOUT CONTRAST  TECHNIQUE Multidetector CT imaging of the head and maxillofacial structures were performed using the standard protocol without intravenous contrast. Multiplanar CT image reconstructions of the maxillofacial structures were also generated.  COMPARISON None.  FINDINGS CT HEAD FINDINGS  There is no evidence of mass effect, midline shift or extra-axial fluid collections. There is no evidence of a space-occupying lesion or intracranial hemorrhage. There is no evidence of a cortical-based area of acute infarction. Bilateral basal ganglia calcifications are noted.  The ventricles and sulci are appropriate for the patient's age. The basal cisterns are patent.  Visualized portions of the orbits are unremarkable. The visualized portions of the paranasal sinuses and mastoid air cells are unremarkable.  The osseous structures are unremarkable.  CT MAXILLOFACIAL FINDINGS  There is mild soft tissue swelling overlying the left zygoma. The globes are intact. The orbital walls are intact. The orbital floors are intact. The maxilla is intact. The mandible is intact. The zygomatic arches are intact. The nasal septum is midline. There is no nasal bone fracture. The temporomandibular joints suboptimally evaluated secondary to patient motion.  There is mild left maxillary sinus mucosal thickening. The visualized portions of the mastoid sinuses are well aerated.  IMPRESSION 1. No acute intracranial pathology. 2. No acute osseous injury of the maxillofacial bones.   SIGNATURE  Electronically Signed   By: Kathreen Devoid   On: 12/13/2013 16:29   Ct Maxillofacial Wo Cm  12/13/2013   CLINICAL DATA Right lateral orbital ecchymosis, edema  EXAM CT HEAD WITHOUT CONTRAST  CT MAXILLOFACIAL WITHOUT CONTRAST  TECHNIQUE Multidetector CT imaging of the head and maxillofacial structures were performed using the standard protocol without intravenous contrast. Multiplanar CT image reconstructions of the maxillofacial structures were also generated.  COMPARISON None.  FINDINGS CT HEAD FINDINGS  There is no evidence of mass effect, midline shift or extra-axial fluid collections. There is no evidence of a space-occupying lesion or intracranial hemorrhage. There is no evidence of a cortical-based area of acute infarction. Bilateral basal ganglia calcifications are noted.  The ventricles and sulci are appropriate for the patient's age. The basal cisterns are patent.  Visualized portions of the orbits are unremarkable. The visualized portions of the paranasal sinuses and mastoid air cells are unremarkable.  The osseous  structures are unremarkable.  CT MAXILLOFACIAL FINDINGS  There is mild soft tissue swelling overlying the left zygoma. The globes are intact. The orbital walls are intact. The orbital floors are intact. The maxilla is intact. The mandible is intact. The zygomatic arches are intact. The nasal septum is midline. There is no nasal bone fracture. The temporomandibular joints suboptimally evaluated secondary to patient motion.  There is mild left maxillary sinus mucosal thickening. The visualized portions of the mastoid sinuses are well aerated.  IMPRESSION 1. No acute intracranial pathology. 2. No acute osseous injury of the maxillofacial bones.  SIGNATURE  Electronically Signed   By: Kathreen Devoid   On: 12/13/2013 16:29     EKG Interpretation None      MDM   Final diagnoses:  Choking    11M presents with severe choking episode. Mother resolved it with abdominal thrusts  after patient passed out and was cyanotic. Back to baseline here, satting well, no stridor, no difficulty breathing, normal lung sounds. Some evidence of facial trauma from his fall. Will CT Head, Face and xray chest. Scans normal. CXR normal. Patient relaxing comfortably, normal vitals, no hypoxia. Stable for discharge, instructed to f/u with PCP and given return precautions if he worsens.  Osvaldo Shipper, MD 12/13/13 [phone]

## 2013-12-13 NOTE — ED Notes (Signed)
MD at bedside. 

## 2013-12-13 NOTE — Discharge Instructions (Signed)
Choking, Adult  Choking occurs when a food or object gets stuck in the throat or trachea, blocking the airway. If the airway is partly blocked, coughing will usually cause the food or object to come out. If the airway is completely blocked, immediate action is needed to help it come out. A complete airway blockage is life-threatening because it causes breathing to stop. Choking is a true medical emergency that requires fast, appropriate action by anyone available.  SIGNS OF AIRWAY BLOCKAGE  There is a partial airway blockage if you or the person who is choking is:    Able to breathe and speak.   Coughing loudly.   Making loud noises.  There is a complete airway blockage if you or the person who is choking is:    Unable to breathe.   Making soft or high-pitched sounds while breathing.   Unable to cough or coughing weakly, ineffectively, or silently.   Unable to cry, speak, or make sounds.   Turning blue.   Holding the neck with both arms. This is the universal sign of choking.  WHAT TO DO IF CHOKING OCCURS  If there is a partial airway blockage, allow coughing to clear the airway. Do not try to drink until the food or object comes out. If someone else has a partial airway blockage, do not interfere. Stay with him or her and watch for signs of complete airway blockage until the food or object comes out.   If there is a complete airway blockage or if there is a partial airway blockage and the food or object does not come out, perform abdominal thrusts (also referred to as the Heimlich maneuver). Abdominal thrusts are used to create an artificial cough to try to clear the airway. Performing abdominal thrusts is part of a series of steps that should be done to help someone who is choking. Abdominal thrusts are usually done by someone else, but if you are alone, you can perform abdominal thrusts on yourself. Follow the procedure below that best fits your situation.   IF SOMEONE ELSE IS CHOKING:  For a conscious  adult:   1. Ask the person whether he or she is choking. If the person nods, continue to step 2.  2. Stand or kneel behind the person and lean him or her forward slightly.  3. Make a fist with 1 hand, put your arms around the person, and grasp your fist with your other hand. Place the thumb side of your fist in the person's abdomen, just below the ribs.  4. Press inward and upward with both hands.  5. Repeat this maneuver until the object comes out and the person is able to breathe or until the person loses consciousness.  For an unconcious adult:  1. Shout for help. If someone responds, have him or her call local emergency services (911 in [place] If no one responds, call local emergency services yourself if possible.  2. Begin CPR, starting with compressions. Every time you open the airway to give rescue breaths, open the person's mouth. If you can see the food or object and it can be easily pulled out, remove it with your fingers.  3. After 5 cycles or 2 minutes of CPR, call local emergency services (911 in [place] if you or someone else did not already call.  For a conscious adult who is obese or in the later stages of pregnancy:  Abdominal thrusts may not be effective when helping people who are in the later stages   of pregnancy or who are obese. In these instances, chest thrusts can be used.   1. Ask the person whether he or she is choking. If the person nods and has signs of complete airway blockage, continue to step 2.  2. Stand behind the person and wrap your arms around his or her chest (with your arms under the person's armpits).  3. Make a fist with 1 hand. Place the thumb side of your fist on the middle of the person's breastbone.  4. Grab your fist with your other hand and thrust backward. Continue this until the object comes out or until the person becomes unconscious.  For an unconscious adult who is obese or in the later stages of pregnancy:   1. Shout for help. If someone responds, have him or her call  local emergency services (911 in [place] If no one responds, call local emergency services yourself if possible.  2. Begin CPR, starting with compressions. Every time you open the airway to give rescue breaths, open the person's mouth. If you can see the food or object and it can be easily pulled out, remove it with your fingers.  3. After 5 cycles or 2 minutes of CPR, call local emergency services (911 in [place] if you or someone else did not already call.  Note that abdominal thrusts (below the rib cage) should be used for a pregnant woman if possible. This should be possible until the later stages of pregnancy when there is no longer enough room between the enlarging uterus and the rib cage to perform the maneuver. At that point, chest thrusts must be used as described.  IF YOU ARE CHOKING:  1. Call local emergency services (911 in [place] if near a landline. Do not worry about communicating what is happening. Do not hang up the phone. Someone may be sent to help you anyway.  2. Make a fist with 1 hand. Put the thumb side of the fist against your stomach, just above the belly button and well below the breastbone. If you are pregnant or obese, put your fist on your chest instead, just below the breastbone and just above your lowest ribs.  3. Hold your fist with your other hand and bend over a hard surface, such as a table or chair.  4. Forcefully push your fist in and up.  5. Continue to do this until the food or object comes out.  PREVENTION   To be prepared if choking occurs, learn how to correctly perform abdominal thrusts and give CPR by taking a certified first-aid training course.   SEEK IMMEDIATE MEDICAL CARE IF:   You have a fever after choking stops.   You have problems breathing after choking stops.   You received the Heimlich maneuver.  MAKE SURE YOU:   Understand these instructions.   Watch your condition.   Get help right away if you are not doing well or get worse.  Document Released: 10/30/2004  Document Revised: 06/16/2012 Document Reviewed: 05/04/2012  [employer] Patient Information 2014 [employer]

## 2014-02-20 DIAGNOSIS — E7800 Pure hypercholesterolemia, unspecified: Secondary | ICD-10-CM | POA: Diagnosis not present

## 2014-02-20 DIAGNOSIS — Z6841 Body Mass Index (BMI) 40.0 and over, adult: Secondary | ICD-10-CM | POA: Diagnosis not present

## 2014-02-20 DIAGNOSIS — E559 Vitamin D deficiency, unspecified: Secondary | ICD-10-CM | POA: Diagnosis not present

## 2014-02-20 DIAGNOSIS — Z79899 Other long term (current) drug therapy: Secondary | ICD-10-CM | POA: Diagnosis not present

## 2014-02-20 DIAGNOSIS — E782 Mixed hyperlipidemia: Secondary | ICD-10-CM | POA: Diagnosis not present

## 2014-02-20 DIAGNOSIS — Z0000 Encounter for general adult medical examination without abnormal findings: Secondary | ICD-10-CM | POA: Diagnosis not present

## 2014-06-13 DIAGNOSIS — H25019 Cortical age-related cataract, unspecified eye: Secondary | ICD-10-CM | POA: Diagnosis not present

## 2014-07-07 DIAGNOSIS — Z23 Encounter for immunization: Secondary | ICD-10-CM | POA: Diagnosis not present

## 2014-09-13 DIAGNOSIS — E782 Mixed hyperlipidemia: Secondary | ICD-10-CM | POA: Diagnosis not present

## 2014-09-13 DIAGNOSIS — E559 Vitamin D deficiency, unspecified: Secondary | ICD-10-CM | POA: Diagnosis not present

## 2014-09-13 DIAGNOSIS — Z0000 Encounter for general adult medical examination without abnormal findings: Secondary | ICD-10-CM | POA: Diagnosis not present

## 2014-09-13 DIAGNOSIS — R351 Nocturia: Secondary | ICD-10-CM | POA: Diagnosis not present

## 2014-09-13 DIAGNOSIS — Z125 Encounter for screening for malignant neoplasm of prostate: Secondary | ICD-10-CM | POA: Diagnosis not present

## 2014-09-13 DIAGNOSIS — Z79899 Other long term (current) drug therapy: Secondary | ICD-10-CM | POA: Diagnosis not present

## 2014-09-13 DIAGNOSIS — I517 Cardiomegaly: Secondary | ICD-10-CM | POA: Diagnosis not present

## 2015-03-15 DIAGNOSIS — L821 Other seborrheic keratosis: Secondary | ICD-10-CM | POA: Diagnosis not present

## 2015-03-15 DIAGNOSIS — L814 Other melanin hyperpigmentation: Secondary | ICD-10-CM | POA: Diagnosis not present

## 2015-03-15 DIAGNOSIS — D225 Melanocytic nevi of trunk: Secondary | ICD-10-CM | POA: Diagnosis not present

## 2015-06-06 DIAGNOSIS — Z0000 Encounter for general adult medical examination without abnormal findings: Secondary | ICD-10-CM | POA: Diagnosis not present

## 2015-06-06 DIAGNOSIS — E785 Hyperlipidemia, unspecified: Secondary | ICD-10-CM | POA: Diagnosis not present

## 2015-07-09 DIAGNOSIS — E785 Hyperlipidemia, unspecified: Secondary | ICD-10-CM | POA: Diagnosis not present

## 2015-07-09 DIAGNOSIS — Z23 Encounter for immunization: Secondary | ICD-10-CM | POA: Diagnosis not present

## 2015-07-09 DIAGNOSIS — Z125 Encounter for screening for malignant neoplasm of prostate: Secondary | ICD-10-CM | POA: Diagnosis not present

## 2015-07-09 DIAGNOSIS — Z0000 Encounter for general adult medical examination without abnormal findings: Secondary | ICD-10-CM | POA: Diagnosis not present

## 2015-07-12 DIAGNOSIS — E785 Hyperlipidemia, unspecified: Secondary | ICD-10-CM | POA: Diagnosis not present

## 2015-08-16 DIAGNOSIS — H52223 Regular astigmatism, bilateral: Secondary | ICD-10-CM | POA: Diagnosis not present

## 2015-08-16 DIAGNOSIS — H5211 Myopia, right eye: Secondary | ICD-10-CM | POA: Diagnosis not present

## 2015-08-16 DIAGNOSIS — H524 Presbyopia: Secondary | ICD-10-CM | POA: Diagnosis not present

## 2015-08-16 DIAGNOSIS — H04129 Dry eye syndrome of unspecified lacrimal gland: Secondary | ICD-10-CM | POA: Diagnosis not present

## 2016-01-07 DIAGNOSIS — E785 Hyperlipidemia, unspecified: Secondary | ICD-10-CM | POA: Diagnosis not present

## 2016-01-11 DIAGNOSIS — Z0000 Encounter for general adult medical examination without abnormal findings: Secondary | ICD-10-CM | POA: Diagnosis not present

## 2016-01-11 DIAGNOSIS — E785 Hyperlipidemia, unspecified: Secondary | ICD-10-CM | POA: Diagnosis not present

## 2016-02-05 DIAGNOSIS — L820 Inflamed seborrheic keratosis: Secondary | ICD-10-CM | POA: Diagnosis not present

## 2016-02-05 DIAGNOSIS — L814 Other melanin hyperpigmentation: Secondary | ICD-10-CM | POA: Diagnosis not present

## 2016-02-05 DIAGNOSIS — D1801 Hemangioma of skin and subcutaneous tissue: Secondary | ICD-10-CM | POA: Diagnosis not present

## 2016-02-05 DIAGNOSIS — L259 Unspecified contact dermatitis, unspecified cause: Secondary | ICD-10-CM | POA: Diagnosis not present

## 2016-02-05 DIAGNOSIS — D225 Melanocytic nevi of trunk: Secondary | ICD-10-CM | POA: Diagnosis not present

## 2016-02-05 DIAGNOSIS — L821 Other seborrheic keratosis: Secondary | ICD-10-CM | POA: Diagnosis not present

## 2016-07-10 DIAGNOSIS — E785 Hyperlipidemia, unspecified: Secondary | ICD-10-CM | POA: Diagnosis not present

## 2016-07-10 DIAGNOSIS — Z0000 Encounter for general adult medical examination without abnormal findings: Secondary | ICD-10-CM | POA: Diagnosis not present

## 2016-07-15 DIAGNOSIS — Z0000 Encounter for general adult medical examination without abnormal findings: Secondary | ICD-10-CM | POA: Diagnosis not present

## 2016-07-15 DIAGNOSIS — E785 Hyperlipidemia, unspecified: Secondary | ICD-10-CM | POA: Diagnosis not present

## 2016-07-15 DIAGNOSIS — E039 Hypothyroidism, unspecified: Secondary | ICD-10-CM | POA: Diagnosis not present

## 2016-07-15 DIAGNOSIS — Z125 Encounter for screening for malignant neoplasm of prostate: Secondary | ICD-10-CM | POA: Diagnosis not present

## 2016-08-22 DIAGNOSIS — H5211 Myopia, right eye: Secondary | ICD-10-CM | POA: Diagnosis not present

## 2016-08-22 DIAGNOSIS — H52223 Regular astigmatism, bilateral: Secondary | ICD-10-CM | POA: Diagnosis not present

## 2016-08-22 DIAGNOSIS — H1045 Other chronic allergic conjunctivitis: Secondary | ICD-10-CM | POA: Diagnosis not present

## 2016-11-10 DIAGNOSIS — Z125 Encounter for screening for malignant neoplasm of prostate: Secondary | ICD-10-CM | POA: Diagnosis not present

## 2016-11-10 DIAGNOSIS — E039 Hypothyroidism, unspecified: Secondary | ICD-10-CM | POA: Diagnosis not present

## 2016-11-10 DIAGNOSIS — E785 Hyperlipidemia, unspecified: Secondary | ICD-10-CM | POA: Diagnosis not present

## 2016-11-21 DIAGNOSIS — E7800 Pure hypercholesterolemia, unspecified: Secondary | ICD-10-CM | POA: Diagnosis not present

## 2016-11-21 DIAGNOSIS — E039 Hypothyroidism, unspecified: Secondary | ICD-10-CM | POA: Diagnosis not present

## 2016-11-21 DIAGNOSIS — D72819 Decreased white blood cell count, unspecified: Secondary | ICD-10-CM | POA: Diagnosis not present

## 2017-02-23 DIAGNOSIS — L821 Other seborrheic keratosis: Secondary | ICD-10-CM | POA: Diagnosis not present

## 2017-02-23 DIAGNOSIS — L814 Other melanin hyperpigmentation: Secondary | ICD-10-CM | POA: Diagnosis not present

## 2017-02-23 DIAGNOSIS — L820 Inflamed seborrheic keratosis: Secondary | ICD-10-CM | POA: Diagnosis not present

## 2017-02-23 DIAGNOSIS — D225 Melanocytic nevi of trunk: Secondary | ICD-10-CM | POA: Diagnosis not present

## 2017-05-18 DIAGNOSIS — D72819 Decreased white blood cell count, unspecified: Secondary | ICD-10-CM | POA: Diagnosis not present

## 2017-05-18 DIAGNOSIS — E7800 Pure hypercholesterolemia, unspecified: Secondary | ICD-10-CM | POA: Diagnosis not present

## 2017-05-18 DIAGNOSIS — E039 Hypothyroidism, unspecified: Secondary | ICD-10-CM | POA: Diagnosis not present

## 2017-05-22 DIAGNOSIS — Z0000 Encounter for general adult medical examination without abnormal findings: Secondary | ICD-10-CM | POA: Diagnosis not present

## 2017-05-22 DIAGNOSIS — E039 Hypothyroidism, unspecified: Secondary | ICD-10-CM | POA: Diagnosis not present

## 2017-05-22 DIAGNOSIS — E785 Hyperlipidemia, unspecified: Secondary | ICD-10-CM | POA: Diagnosis not present

## 2017-08-24 DIAGNOSIS — H52223 Regular astigmatism, bilateral: Secondary | ICD-10-CM | POA: Diagnosis not present

## 2017-08-24 DIAGNOSIS — H524 Presbyopia: Secondary | ICD-10-CM | POA: Diagnosis not present

## 2017-08-24 DIAGNOSIS — H1045 Other chronic allergic conjunctivitis: Secondary | ICD-10-CM | POA: Diagnosis not present

## 2017-08-24 DIAGNOSIS — H5211 Myopia, right eye: Secondary | ICD-10-CM | POA: Diagnosis not present

## 2017-11-17 DIAGNOSIS — E039 Hypothyroidism, unspecified: Secondary | ICD-10-CM | POA: Diagnosis not present

## 2017-11-17 DIAGNOSIS — Z125 Encounter for screening for malignant neoplasm of prostate: Secondary | ICD-10-CM | POA: Diagnosis not present

## 2017-11-17 DIAGNOSIS — E785 Hyperlipidemia, unspecified: Secondary | ICD-10-CM | POA: Diagnosis not present

## 2017-12-02 DIAGNOSIS — E039 Hypothyroidism, unspecified: Secondary | ICD-10-CM | POA: Diagnosis not present

## 2017-12-02 DIAGNOSIS — R230 Cyanosis: Secondary | ICD-10-CM | POA: Diagnosis not present

## 2017-12-02 DIAGNOSIS — Z0000 Encounter for general adult medical examination without abnormal findings: Secondary | ICD-10-CM | POA: Diagnosis not present

## 2017-12-02 DIAGNOSIS — E7800 Pure hypercholesterolemia, unspecified: Secondary | ICD-10-CM | POA: Diagnosis not present

## 2018-05-24 DIAGNOSIS — E039 Hypothyroidism, unspecified: Secondary | ICD-10-CM | POA: Diagnosis not present

## 2018-05-24 DIAGNOSIS — E7800 Pure hypercholesterolemia, unspecified: Secondary | ICD-10-CM | POA: Diagnosis not present

## 2018-06-09 DIAGNOSIS — R230 Cyanosis: Secondary | ICD-10-CM | POA: Diagnosis not present

## 2018-06-09 DIAGNOSIS — E039 Hypothyroidism, unspecified: Secondary | ICD-10-CM | POA: Diagnosis not present

## 2018-06-09 DIAGNOSIS — E7800 Pure hypercholesterolemia, unspecified: Secondary | ICD-10-CM | POA: Diagnosis not present

## 2018-06-11 DIAGNOSIS — H25043 Posterior subcapsular polar age-related cataract, bilateral: Secondary | ICD-10-CM | POA: Diagnosis not present

## 2018-06-11 DIAGNOSIS — H26493 Other secondary cataract, bilateral: Secondary | ICD-10-CM | POA: Diagnosis not present

## 2018-06-15 DIAGNOSIS — L820 Inflamed seborrheic keratosis: Secondary | ICD-10-CM | POA: Diagnosis not present

## 2018-06-15 DIAGNOSIS — D225 Melanocytic nevi of trunk: Secondary | ICD-10-CM | POA: Diagnosis not present

## 2018-06-15 DIAGNOSIS — L821 Other seborrheic keratosis: Secondary | ICD-10-CM | POA: Diagnosis not present

## 2018-06-15 DIAGNOSIS — L570 Actinic keratosis: Secondary | ICD-10-CM | POA: Diagnosis not present

## 2018-09-18 ENCOUNTER — Encounter (HOSPITAL_BASED_OUTPATIENT_CLINIC_OR_DEPARTMENT_OTHER): Payer: Self-pay | Admitting: *Deleted

## 2018-09-18 ENCOUNTER — Other Ambulatory Visit: Payer: Self-pay

## 2018-09-18 ENCOUNTER — Emergency Department (HOSPITAL_BASED_OUTPATIENT_CLINIC_OR_DEPARTMENT_OTHER): Payer: Medicare Other

## 2018-09-18 DIAGNOSIS — Y929 Unspecified place or not applicable: Secondary | ICD-10-CM | POA: Insufficient documentation

## 2018-09-18 DIAGNOSIS — Z79899 Other long term (current) drug therapy: Secondary | ICD-10-CM | POA: Insufficient documentation

## 2018-09-18 DIAGNOSIS — M25561 Pain in right knee: Secondary | ICD-10-CM | POA: Diagnosis not present

## 2018-09-18 DIAGNOSIS — W010XXA Fall on same level from slipping, tripping and stumbling without subsequent striking against object, initial encounter: Secondary | ICD-10-CM | POA: Insufficient documentation

## 2018-09-18 DIAGNOSIS — Q909 Down syndrome, unspecified: Secondary | ICD-10-CM | POA: Insufficient documentation

## 2018-09-18 DIAGNOSIS — Y999 Unspecified external cause status: Secondary | ICD-10-CM | POA: Insufficient documentation

## 2018-09-18 DIAGNOSIS — Z7982 Long term (current) use of aspirin: Secondary | ICD-10-CM | POA: Diagnosis not present

## 2018-09-18 DIAGNOSIS — S8991XA Unspecified injury of right lower leg, initial encounter: Secondary | ICD-10-CM | POA: Diagnosis not present

## 2018-09-18 DIAGNOSIS — Y9389 Activity, other specified: Secondary | ICD-10-CM | POA: Insufficient documentation

## 2018-09-18 IMAGING — CR DG KNEE COMPLETE 4+V*R*
4 series · 4 of 4 positions shown · non-contrast
Comparison: None.

CLINICAL DATA: Fall.  Knee pain

EXAM:
RIGHT KNEE - COMPLETE 4+ VIEW

[t knee ap right]
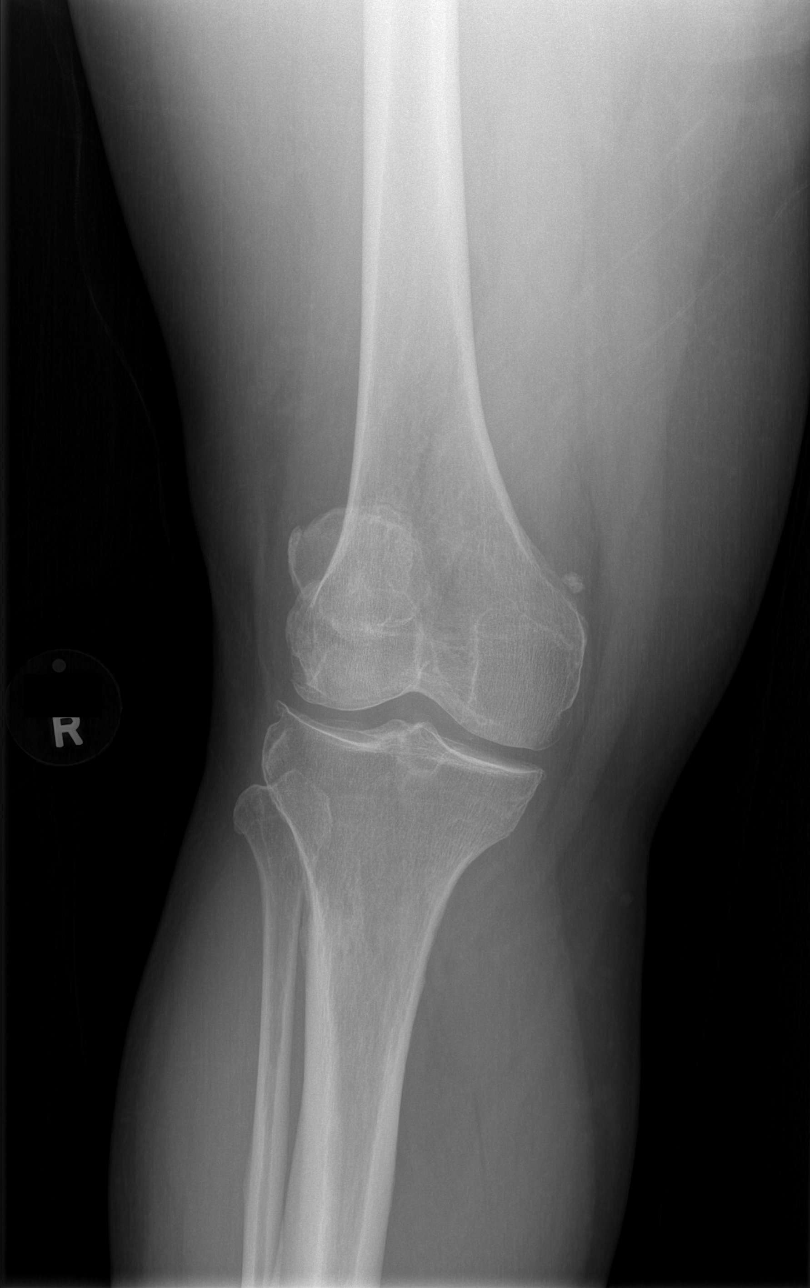

[t knee oblique right (1 of 2)]
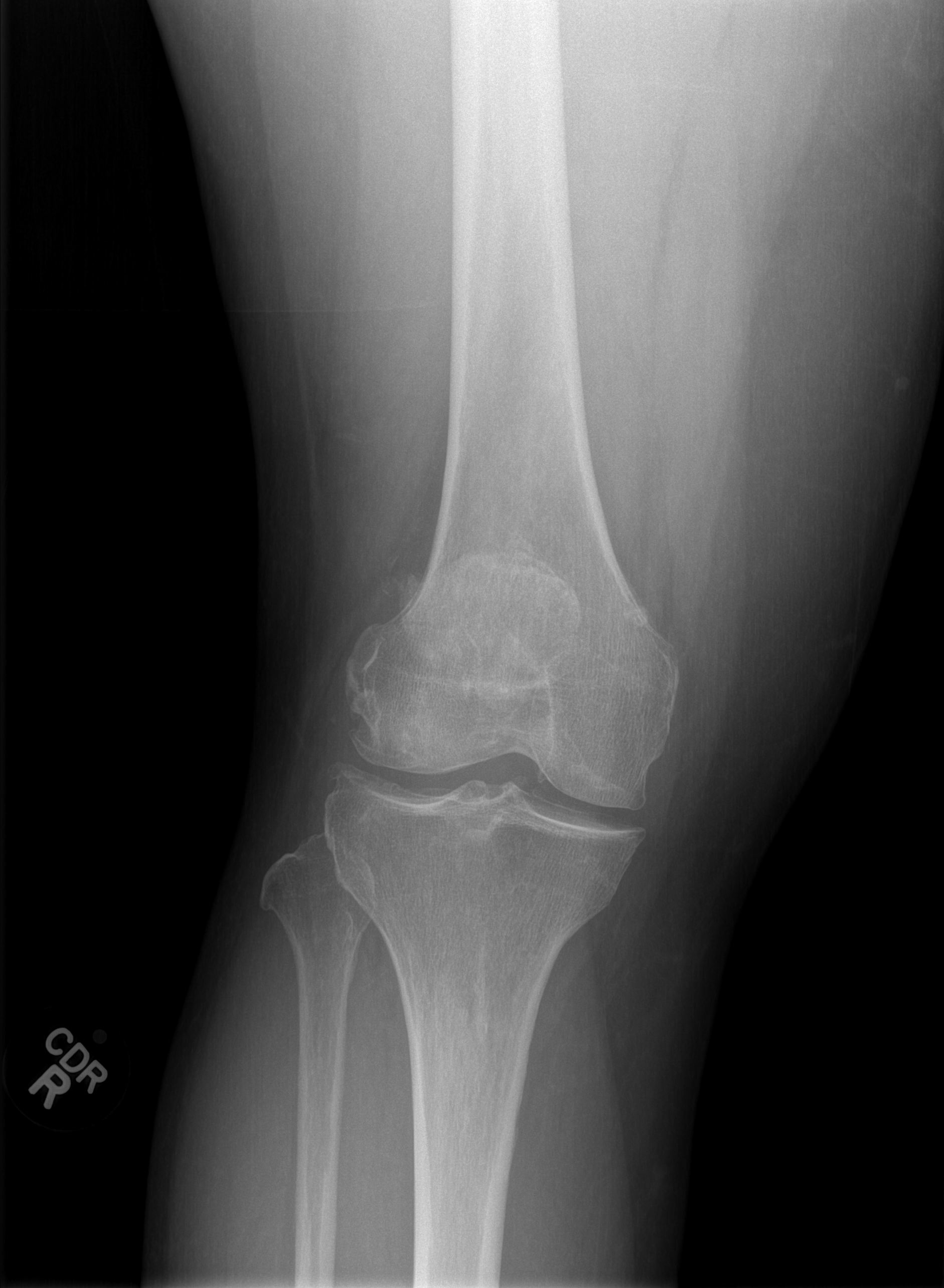

[t knee oblique right (2 of 2)]
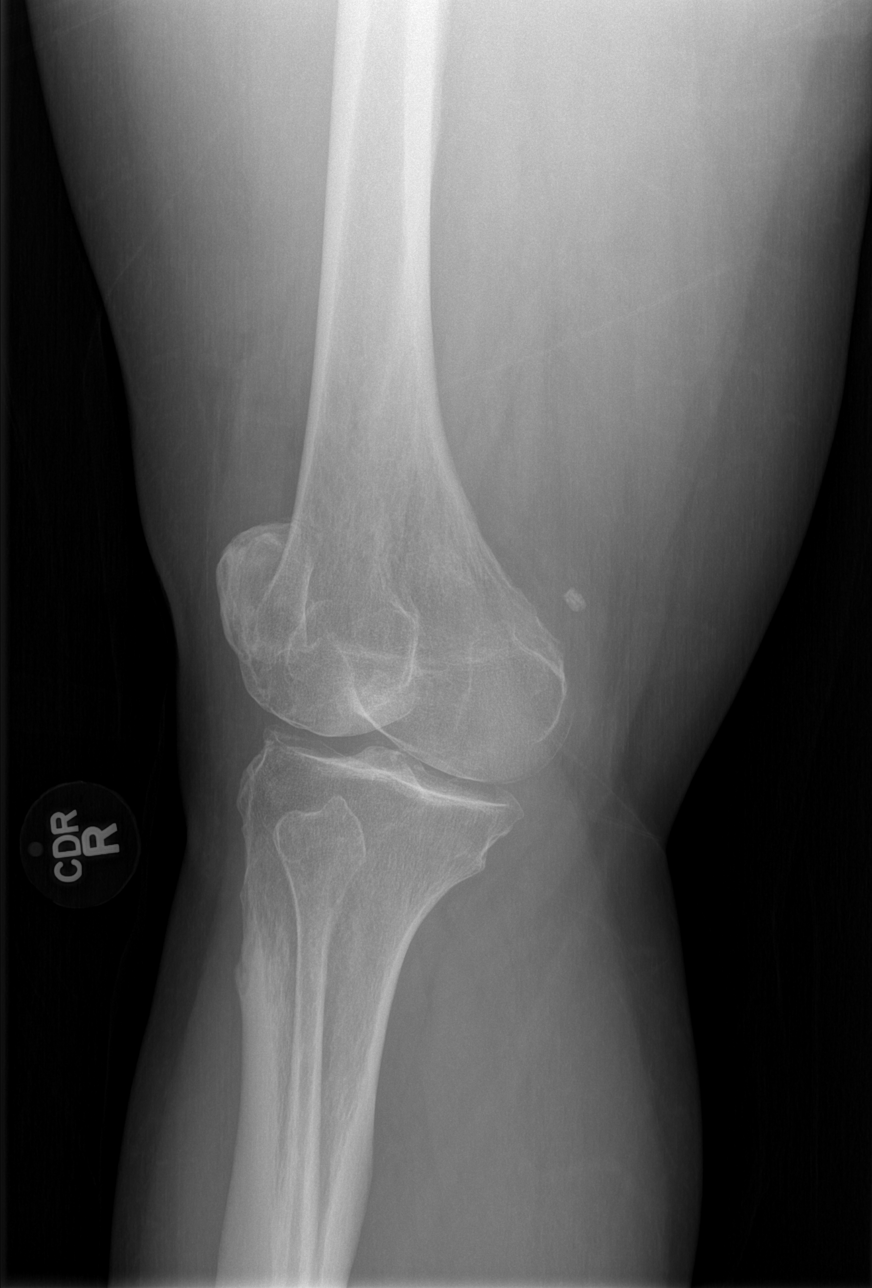

[t knee lat right]
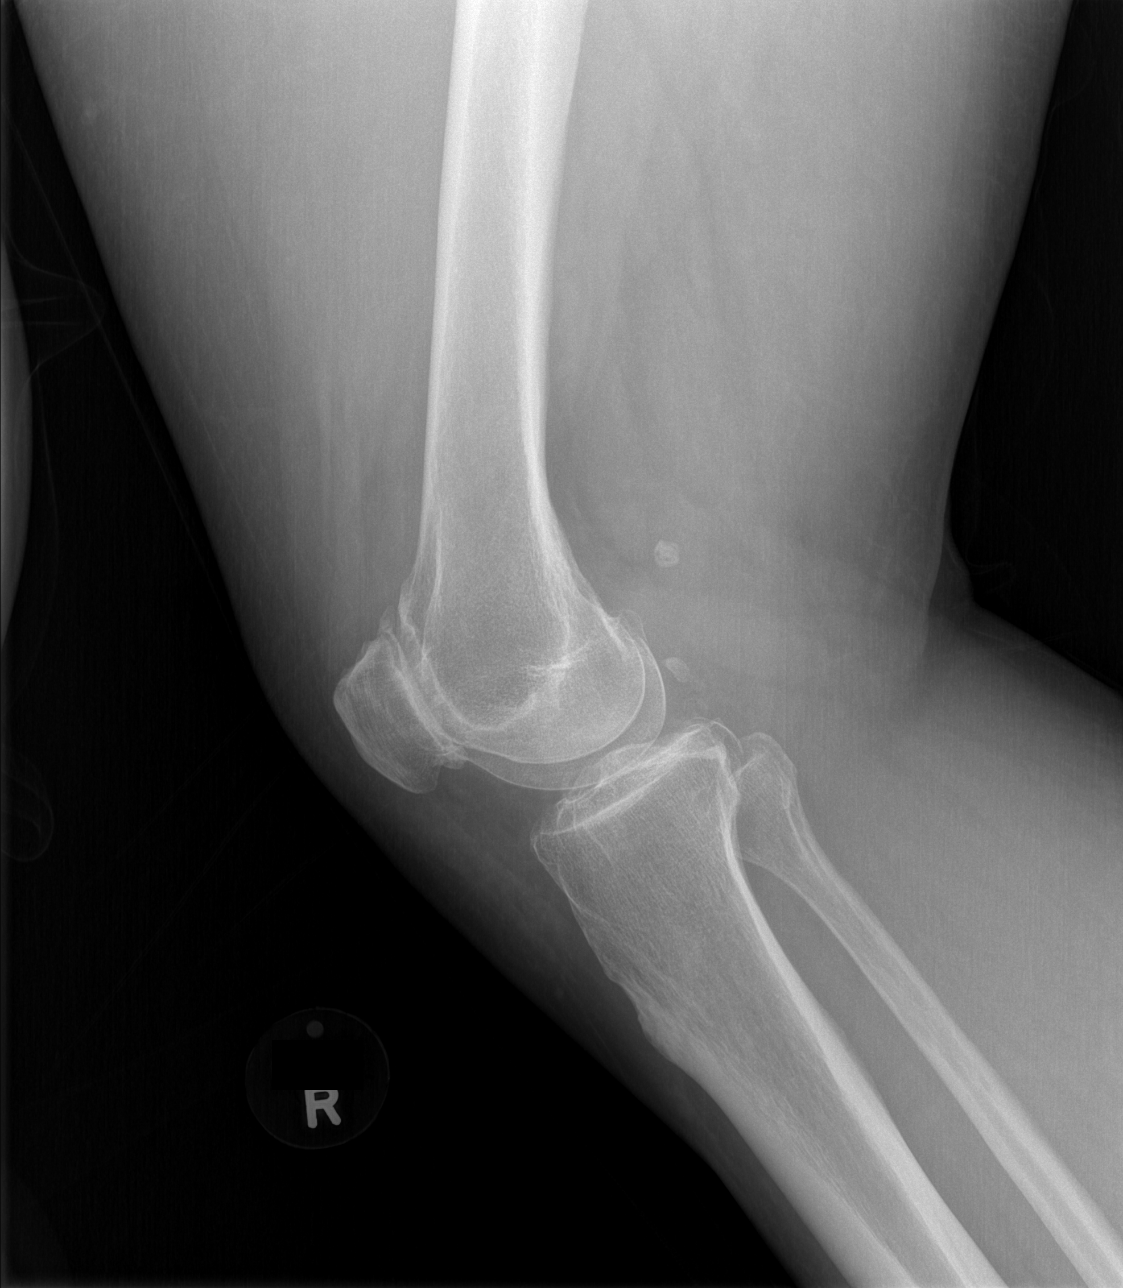

[4 of 4 positions shown; findings below may reference images not displayed]

FINDINGS: Tricompartment degenerative changes, most pronounced in the
patellofemoral compartment with joint space narrowing and spurring.
No acute bony abnormality. Specifically, no fracture, subluxation,
or dislocation. No joint effusion.
IMPRESSION: Degenerative changes.  No acute bony abnormality.

## 2018-09-18 NOTE — ED Triage Notes (Signed)
Pt fell outside today.  Reports right knee pain since that time. Able to walk with pain.

## 2018-09-19 ENCOUNTER — Emergency Department (HOSPITAL_BASED_OUTPATIENT_CLINIC_OR_DEPARTMENT_OTHER)
Admission: EM | Admit: 2018-09-19 | Discharge: 2018-09-19 | Disposition: A | Discharge status: Home / Self Care | Payer: Medicare Other | Attending: Emergency Medicine | Admitting: Emergency Medicine

## 2018-09-19 DIAGNOSIS — W010XXA Fall on same level from slipping, tripping and stumbling without subsequent striking against object, initial encounter: Secondary | ICD-10-CM

## 2018-09-19 DIAGNOSIS — S8991XA Unspecified injury of right lower leg, initial encounter: Secondary | ICD-10-CM

## 2018-09-19 MED ORDER — NAPROXEN 250 MG PO TABS
500.0000 mg | ORAL_TABLET | Freq: Once | ORAL | Status: AC
  Administered 2018-09-19: 500 mg via ORAL
  Filled 2018-09-19: qty 2

## 2018-09-19 NOTE — ED Notes (Signed)
Pt had 800mg  ibuprofen 1830 on 12/14

## 2018-09-19 NOTE — ED Provider Notes (Signed)
[facility] Provider Note: Georgena Spurling, MD, FACEP  CSN: [id number] MRN: [id number] ARRIVAL: 09/18/18 at 2127 ROOM: [place]  Knee Injury   HISTORY OF PRESENT ILLNESS  09/19/18 2:46 AM Joseph Williams is a 52 y.o. male with Down syndrome.  He was walking to the mailbox yesterday afternoon and slipped on some wet grass.  He injured his right knee.  He is having pain in his right knee which his mother estimates is about 8 out of 10.  It is making ambulation difficult and he is requiring assistance.  There is no gross deformity of the right knee.    Past Medical History:  Diagnosis Date  . Down's syndrome   . H/O echocardiogram 01/12/2012   Normal left ventricular wall thickness; Left ventricular systolic function is normal;  there is trace mitral and tricuspid regurgitation. EF 74%  . High cholesterol   . Persistent pneumonia     Past Surgical History:  Procedure Laterality Date  . HERNIA REPAIR  2006   bilateal inguinal and ventral    Family History  Problem Relation Age of Onset  . Heart attack Father   . Breast cancer Mother        x 3  . Throat cancer Maternal Grandfather     Social History   Tobacco Use  . Smoking status: Never Smoker  . Smokeless tobacco: Never Used  Substance Use Topics  . Alcohol use: No  . Drug use: No    Prior to Admission medications   Medication Sig Start Date End Date Taking? Authorizing Provider  aspirin 81 MG tablet Take 81 mg by mouth daily.    [provider]  cholecalciferol (VITAMIN D) 1000 UNITS tablet Take 1,000 Units by mouth daily.    [provider]  Cyanocobalamin (B-12) 1000 MCG CAPS Take by mouth daily.    [provider]  Rosuvastatin Calcium (CRESTOR PO) Take by mouth.    [provider]  simvastatin (ZOCOR) 40 MG tablet Take 40 mg by mouth every evening.    [provider]    Allergies Patient has no known allergies.   REVIEW OF  SYSTEMS  Negative except as noted here or in the History of Present Illness.   PHYSICAL EXAMINATION  Initial Vital Signs Blood pressure 121/71, pulse 73, temperature 99.2 F (37.3 C), temperature source Oral, resp. rate 19, height 5\' 2"  (1.575 m), weight 102.1 kg, SpO2 96 %.  Examination General: Well-developed, well-nourished male in no acute distress; appearance consistent with age of record HENT: Down's facies; atraumatic Eyes: pupils equal, round and reactive to light Neck: supple Heart: regular rate and rhythm Lungs: clear to auscultation bilaterally Abdomen: soft; obese; nontender; bowel sounds present Extremities: No deformity; tenderness of right knee, joint grossly stable Neurologic: Awake, alert; motor function intact in all extremities and symmetric; no facial droop Skin: Warm and dry Psychiatric: Normal mood and affect   RESULTS  Summary of this visit's results, reviewed by myself:   EKG Interpretation  Date/Time:    Ventricular Rate:    PR Interval:    QRS Duration:   QT Interval:    QTC Calculation:   R Axis:     Text Interpretation:        Laboratory Studies: No results found for this or any previous visit (from the past 24 hour(s)). Imaging Studies: Dg Knee Complete 4 Views Right  Result Date: 09/18/2018 CLINICAL DATA:  Fall.  Knee pain EXAM: RIGHT  KNEE - COMPLETE 4+ VIEW COMPARISON:  None. FINDINGS: Tricompartment degenerative changes, most pronounced in the patellofemoral compartment with joint space narrowing and spurring. No acute bony abnormality. Specifically, no fracture, subluxation, or dislocation. No joint effusion. IMPRESSION: Degenerative changes.  No acute bony abnormality. Electronically Signed   By: Rolm Baptise M.D.   On: 09/18/2018 22:14    ED COURSE and MDM  Nursing notes and initial vitals signs, including pulse oximetry, reviewed.  Vitals:   09/18/18 2138 09/18/18 2140 09/19/18 0200  BP:  120/70 121/71  Pulse:  75 73  Resp:   20 19  Temp:  98.7 F (37.1 C) 99.2 F (37.3 C)  TempSrc:  Oral Oral  SpO2:  95% 96%  Weight: 102.1 kg    Height: 5\' 2"  (1.575 m)     Will place a knee immobilizer.  His mother will arrange to obtain a walker as he would not do well with crutches.  PROCEDURES    ED DIAGNOSES     ICD-10-CM   1. Fall from slip, trip, or stumble, initial encounter W01.0XXA   2. Right knee injury, initial encounter S89.91XA        Izumi Mixon, Jenny Reichmann, MD 09/19/18 [phone]

## 2018-10-11 DIAGNOSIS — M25571 Pain in right ankle and joints of right foot: Secondary | ICD-10-CM | POA: Diagnosis not present

## 2018-10-11 DIAGNOSIS — M25561 Pain in right knee: Secondary | ICD-10-CM | POA: Diagnosis not present

## 2018-10-15 DIAGNOSIS — H25043 Posterior subcapsular polar age-related cataract, bilateral: Secondary | ICD-10-CM | POA: Diagnosis not present

## 2018-12-03 DIAGNOSIS — E039 Hypothyroidism, unspecified: Secondary | ICD-10-CM | POA: Diagnosis not present

## 2018-12-03 DIAGNOSIS — E785 Hyperlipidemia, unspecified: Secondary | ICD-10-CM | POA: Diagnosis not present

## 2018-12-10 DIAGNOSIS — Z0000 Encounter for general adult medical examination without abnormal findings: Secondary | ICD-10-CM | POA: Diagnosis not present

## 2018-12-10 DIAGNOSIS — R0683 Snoring: Secondary | ICD-10-CM | POA: Diagnosis not present

## 2018-12-11 DIAGNOSIS — R400 Somnolence: Secondary | ICD-10-CM | POA: Diagnosis not present

## 2018-12-13 DIAGNOSIS — R400 Somnolence: Secondary | ICD-10-CM | POA: Diagnosis not present

## 2019-06-01 DIAGNOSIS — G4734 Idiopathic sleep related nonobstructive alveolar hypoventilation: Secondary | ICD-10-CM | POA: Diagnosis not present

## 2019-06-01 DIAGNOSIS — R0683 Snoring: Secondary | ICD-10-CM | POA: Diagnosis not present

## 2019-06-01 DIAGNOSIS — R0681 Apnea, not elsewhere classified: Secondary | ICD-10-CM | POA: Diagnosis not present

## 2019-06-10 DIAGNOSIS — G4734 Idiopathic sleep related nonobstructive alveolar hypoventilation: Secondary | ICD-10-CM | POA: Diagnosis not present

## 2019-06-10 DIAGNOSIS — R5382 Chronic fatigue, unspecified: Secondary | ICD-10-CM | POA: Diagnosis not present

## 2019-06-10 DIAGNOSIS — E785 Hyperlipidemia, unspecified: Secondary | ICD-10-CM | POA: Diagnosis not present

## 2019-06-10 DIAGNOSIS — E039 Hypothyroidism, unspecified: Secondary | ICD-10-CM | POA: Diagnosis not present

## 2019-06-10 DIAGNOSIS — R0683 Snoring: Secondary | ICD-10-CM | POA: Diagnosis not present

## 2019-06-16 DIAGNOSIS — H25043 Posterior subcapsular polar age-related cataract, bilateral: Secondary | ICD-10-CM | POA: Diagnosis not present

## 2019-06-22 DIAGNOSIS — G4733 Obstructive sleep apnea (adult) (pediatric): Secondary | ICD-10-CM | POA: Diagnosis not present

## 2019-06-30 DIAGNOSIS — G4733 Obstructive sleep apnea (adult) (pediatric): Secondary | ICD-10-CM | POA: Diagnosis not present

## 2019-07-30 DIAGNOSIS — G4733 Obstructive sleep apnea (adult) (pediatric): Secondary | ICD-10-CM | POA: Diagnosis not present

## 2019-08-30 DIAGNOSIS — G4733 Obstructive sleep apnea (adult) (pediatric): Secondary | ICD-10-CM | POA: Diagnosis not present

## 2019-09-21 DIAGNOSIS — G4733 Obstructive sleep apnea (adult) (pediatric): Secondary | ICD-10-CM | POA: Diagnosis not present

## 2019-09-28 DIAGNOSIS — H6123 Impacted cerumen, bilateral: Secondary | ICD-10-CM | POA: Diagnosis not present

## 2019-09-28 DIAGNOSIS — H906 Mixed conductive and sensorineural hearing loss, bilateral: Secondary | ICD-10-CM | POA: Diagnosis not present

## 2019-09-29 DIAGNOSIS — G4733 Obstructive sleep apnea (adult) (pediatric): Secondary | ICD-10-CM | POA: Diagnosis not present

## 2019-10-02 DIAGNOSIS — G4733 Obstructive sleep apnea (adult) (pediatric): Secondary | ICD-10-CM | POA: Diagnosis not present

## 2019-10-19 DIAGNOSIS — G4733 Obstructive sleep apnea (adult) (pediatric): Secondary | ICD-10-CM | POA: Diagnosis not present

## 2019-10-30 DIAGNOSIS — G4733 Obstructive sleep apnea (adult) (pediatric): Secondary | ICD-10-CM | POA: Diagnosis not present

## 2019-11-22 DIAGNOSIS — G4733 Obstructive sleep apnea (adult) (pediatric): Secondary | ICD-10-CM | POA: Diagnosis not present

## 2019-11-30 DIAGNOSIS — G4733 Obstructive sleep apnea (adult) (pediatric): Secondary | ICD-10-CM | POA: Diagnosis not present

## 2019-12-01 DIAGNOSIS — E039 Hypothyroidism, unspecified: Secondary | ICD-10-CM | POA: Diagnosis not present

## 2019-12-01 DIAGNOSIS — Z0000 Encounter for general adult medical examination without abnormal findings: Secondary | ICD-10-CM | POA: Diagnosis not present

## 2019-12-01 DIAGNOSIS — E785 Hyperlipidemia, unspecified: Secondary | ICD-10-CM | POA: Diagnosis not present

## 2019-12-01 DIAGNOSIS — R5382 Chronic fatigue, unspecified: Secondary | ICD-10-CM | POA: Diagnosis not present

## 2019-12-08 DIAGNOSIS — R230 Cyanosis: Secondary | ICD-10-CM | POA: Diagnosis not present

## 2019-12-08 DIAGNOSIS — Z0000 Encounter for general adult medical examination without abnormal findings: Secondary | ICD-10-CM | POA: Diagnosis not present

## 2019-12-28 DIAGNOSIS — G4733 Obstructive sleep apnea (adult) (pediatric): Secondary | ICD-10-CM | POA: Diagnosis not present

## 2020-01-03 DIAGNOSIS — R0902 Hypoxemia: Secondary | ICD-10-CM | POA: Diagnosis not present

## 2020-01-11 ENCOUNTER — Other Ambulatory Visit (HOSPITAL_BASED_OUTPATIENT_CLINIC_OR_DEPARTMENT_OTHER): Payer: Self-pay

## 2020-01-11 DIAGNOSIS — G4734 Idiopathic sleep related nonobstructive alveolar hypoventilation: Secondary | ICD-10-CM

## 2020-01-11 DIAGNOSIS — G4733 Obstructive sleep apnea (adult) (pediatric): Secondary | ICD-10-CM

## 2020-01-28 DIAGNOSIS — G4733 Obstructive sleep apnea (adult) (pediatric): Secondary | ICD-10-CM | POA: Diagnosis not present

## 2020-01-31 ENCOUNTER — Other Ambulatory Visit (HOSPITAL_COMMUNITY)
Admission: RE | Admit: 2020-01-31 | Discharge: 2020-01-31 | Disposition: A | Discharge status: Home / Self Care | Payer: Medicare Other | Source: Ambulatory Visit | Attending: Internal Medicine | Admitting: Internal Medicine

## 2020-01-31 DIAGNOSIS — Z20822 Contact with and (suspected) exposure to covid-19: Secondary | ICD-10-CM | POA: Diagnosis not present

## 2020-01-31 DIAGNOSIS — Z01812 Encounter for preprocedural laboratory examination: Secondary | ICD-10-CM | POA: Diagnosis not present

## 2020-01-31 LAB — SARS CORONAVIRUS 2 (TAT 6-24 HRS): SARS Coronavirus 2: NEGATIVE

## 2020-02-02 ENCOUNTER — Ambulatory Visit (HOSPITAL_BASED_OUTPATIENT_CLINIC_OR_DEPARTMENT_OTHER): Payer: Medicare Other | Attending: Internal Medicine | Admitting: Internal Medicine

## 2020-02-02 ENCOUNTER — Other Ambulatory Visit: Payer: Self-pay

## 2020-02-02 DIAGNOSIS — G4734 Idiopathic sleep related nonobstructive alveolar hypoventilation: Secondary | ICD-10-CM | POA: Diagnosis not present

## 2020-02-02 DIAGNOSIS — R0683 Snoring: Secondary | ICD-10-CM | POA: Diagnosis not present

## 2020-02-02 DIAGNOSIS — G4733 Obstructive sleep apnea (adult) (pediatric): Secondary | ICD-10-CM | POA: Diagnosis not present

## 2020-02-03 ENCOUNTER — Other Ambulatory Visit (HOSPITAL_BASED_OUTPATIENT_CLINIC_OR_DEPARTMENT_OTHER): Payer: Self-pay

## 2020-02-03 DIAGNOSIS — G4733 Obstructive sleep apnea (adult) (pediatric): Secondary | ICD-10-CM | POA: Diagnosis not present

## 2020-02-03 DIAGNOSIS — G4734 Idiopathic sleep related nonobstructive alveolar hypoventilation: Secondary | ICD-10-CM

## 2020-02-03 NOTE — Procedures (Signed)
   NAME: Joseph Williams DATE OF BIRTH:  Jul 08, 1966 MEDICAL RECORD NUMBER [id number]  LOCATION: [facility]  PHYSICIAN: Marius Ditch  DATE OF STUDY: 02/02/2020  SLEEP STUDY TYPE: Positive Airway Pressure Titration               REFERRING PHYSICIAN: Marius Ditch, MD  INDICATION FOR STUDY: Inadequately controlled OSA with auto adjusting CPAP. Persisting hypoxemia present as well on APAP. APAP 95%ile pressure was 13.   EPWORTH SLEEPINESS SCORE:  NA HEIGHT: 5\' 1"  (154.9 cm)  WEIGHT: 228 lb (103.4 kg)    Body mass index is 43.08 kg/m.  NECK SIZE: 17.5 in.  SLEEP STUDY TECHNIQUE The patient underwent an attended overnight polysomnography titration to assess the effects of cpap therapy. The following variables were monitored: EEG(C4-A1, C3-A2, O1-A2, O2-A1), EOG, submental and leg EMG, ECG, oxyhemoglobin saturation by pulse oximetry, thoracic and abdominal respiratory effort belts, nasal/oral airflow by pressure sensor, body position sensor and snoring sensor. CPAP pressure was titrated to eliminate apneas, hypopneas and oxygen desaturation.  TECHNICAL COMMENTS Comments added by Technician: NONE Comments added by Scorer: N/A  SLEEP ARCHITECTURE The study was initiated at 10:55:10 PM and terminated at 5:02:06 AM. Total recorded time was 366.9 minutes. EEG confirmed total sleep time was 309.5 minutes yielding a sleep efficiency of 84.3%%. Sleep onset after lights out was 19.7 minutes with a REM latency of 96.0 minutes. The patient spent 1.3%% of the night in stage N1 sleep, 86.4%% in stage N2 sleep, 0.0%% in stage N3 and 12.3% in REM. The Arousal Index was 3.3/hour.  RESPIRATORY PARAMETERS The overall AHI was 14.0 per hour, and the RDI was 14.2 events/hour with a central apnea index of 0.2per hour. The most appropriate setting of CPAP was IPAP/EPAP 17/17 cm H2O. At this setting, the sleep efficiency was 80% and the patient was supine for 100%. The AHI was 1.3 events  per hour, and the RDI was 1.8 events/hour (with 0.2 central events) and the arousal index was 4.0 per hour at optimal pressure.The oxygen nadir was 94.0% at optimal pressure. Of note is that CPAP of 15 through 17 afforded him good airway control and resolved hypoxemia.   LEG MOVEMENT DATA The total leg movements were 0 with a resulting leg movement index of 0.0/hr. Associated arousal with leg movement index was 0.0/hr.  CARDIAC DATA The underlying cardiac rhythm was most consistent with sinus rhythm. Mean heart rate during sleep was 62.1 bpm. Additional rhythm abnormalities include None.  IMPRESSIONS - Very good CPAP titration with optimal pressure attained. - No Significant Central Sleep Apnea (CSA)  DIAGNOSIS - Obstructive Sleep Apnea (327.23 [G47.33 ICD-10])  RECOMMENDATIONS - CPAP 17 cm H2O is recommended with ramp if needed. If patient does not tolerate this pressure due to leak, CPAP of 15 or 16 could be used with good airway control and resolved hypoxemia.  Marius Ditch Sleep specialist, [place] [employer]  ELECTRONICALLY SIGNED ON:  02/03/2020, 7:31 PM [place] PH: [phone]   FX: [phone] [place]

## 2020-02-20 DIAGNOSIS — G4733 Obstructive sleep apnea (adult) (pediatric): Secondary | ICD-10-CM | POA: Diagnosis not present

## 2020-02-27 DIAGNOSIS — G4733 Obstructive sleep apnea (adult) (pediatric): Secondary | ICD-10-CM | POA: Diagnosis not present

## 2020-03-20 DIAGNOSIS — G4733 Obstructive sleep apnea (adult) (pediatric): Secondary | ICD-10-CM | POA: Diagnosis not present

## 2020-03-28 DIAGNOSIS — H6123 Impacted cerumen, bilateral: Secondary | ICD-10-CM | POA: Diagnosis not present

## 2020-03-29 DIAGNOSIS — G4733 Obstructive sleep apnea (adult) (pediatric): Secondary | ICD-10-CM | POA: Diagnosis not present

## 2020-04-28 DIAGNOSIS — G4733 Obstructive sleep apnea (adult) (pediatric): Secondary | ICD-10-CM | POA: Diagnosis not present

## 2020-05-29 DIAGNOSIS — G4733 Obstructive sleep apnea (adult) (pediatric): Secondary | ICD-10-CM | POA: Diagnosis not present

## 2020-06-06 DIAGNOSIS — E7800 Pure hypercholesterolemia, unspecified: Secondary | ICD-10-CM | POA: Diagnosis not present

## 2020-06-06 DIAGNOSIS — E039 Hypothyroidism, unspecified: Secondary | ICD-10-CM | POA: Diagnosis not present

## 2020-06-06 DIAGNOSIS — R5382 Chronic fatigue, unspecified: Secondary | ICD-10-CM | POA: Diagnosis not present

## 2020-06-12 DIAGNOSIS — E785 Hyperlipidemia, unspecified: Secondary | ICD-10-CM | POA: Diagnosis not present

## 2020-06-12 DIAGNOSIS — E039 Hypothyroidism, unspecified: Secondary | ICD-10-CM | POA: Diagnosis not present

## 2020-06-12 DIAGNOSIS — R230 Cyanosis: Secondary | ICD-10-CM | POA: Diagnosis not present

## 2020-06-12 DIAGNOSIS — R5382 Chronic fatigue, unspecified: Secondary | ICD-10-CM | POA: Diagnosis not present

## 2020-07-02 DIAGNOSIS — G4733 Obstructive sleep apnea (adult) (pediatric): Secondary | ICD-10-CM | POA: Diagnosis not present

## 2020-07-04 DIAGNOSIS — M25511 Pain in right shoulder: Secondary | ICD-10-CM | POA: Diagnosis not present

## 2020-07-11 DIAGNOSIS — B078 Other viral warts: Secondary | ICD-10-CM | POA: Diagnosis not present

## 2020-07-11 DIAGNOSIS — D1801 Hemangioma of skin and subcutaneous tissue: Secondary | ICD-10-CM | POA: Diagnosis not present

## 2020-07-11 DIAGNOSIS — L570 Actinic keratosis: Secondary | ICD-10-CM | POA: Diagnosis not present

## 2020-07-11 DIAGNOSIS — D225 Melanocytic nevi of trunk: Secondary | ICD-10-CM | POA: Diagnosis not present

## 2020-07-11 DIAGNOSIS — L821 Other seborrheic keratosis: Secondary | ICD-10-CM | POA: Diagnosis not present

## 2020-07-11 DIAGNOSIS — L814 Other melanin hyperpigmentation: Secondary | ICD-10-CM | POA: Diagnosis not present

## 2020-07-20 DIAGNOSIS — H25043 Posterior subcapsular polar age-related cataract, bilateral: Secondary | ICD-10-CM | POA: Diagnosis not present

## 2020-07-20 DIAGNOSIS — H5211 Myopia, right eye: Secondary | ICD-10-CM | POA: Diagnosis not present

## 2020-08-20 DIAGNOSIS — G4733 Obstructive sleep apnea (adult) (pediatric): Secondary | ICD-10-CM | POA: Diagnosis not present

## 2020-09-19 DIAGNOSIS — G4733 Obstructive sleep apnea (adult) (pediatric): Secondary | ICD-10-CM | POA: Diagnosis not present

## 2020-09-24 DIAGNOSIS — H906 Mixed conductive and sensorineural hearing loss, bilateral: Secondary | ICD-10-CM | POA: Diagnosis not present

## 2020-09-24 DIAGNOSIS — H6123 Impacted cerumen, bilateral: Secondary | ICD-10-CM | POA: Diagnosis not present

## 2020-10-18 DIAGNOSIS — R413 Other amnesia: Secondary | ICD-10-CM | POA: Diagnosis not present

## 2020-10-18 DIAGNOSIS — E782 Mixed hyperlipidemia: Secondary | ICD-10-CM | POA: Diagnosis not present

## 2020-10-18 DIAGNOSIS — R7309 Other abnormal glucose: Secondary | ICD-10-CM | POA: Diagnosis not present

## 2020-10-18 DIAGNOSIS — E559 Vitamin D deficiency, unspecified: Secondary | ICD-10-CM | POA: Diagnosis not present

## 2020-10-30 DIAGNOSIS — B353 Tinea pedis: Secondary | ICD-10-CM | POA: Diagnosis not present

## 2020-10-30 DIAGNOSIS — L249 Irritant contact dermatitis, unspecified cause: Secondary | ICD-10-CM | POA: Diagnosis not present

## 2020-11-19 DIAGNOSIS — G4733 Obstructive sleep apnea (adult) (pediatric): Secondary | ICD-10-CM | POA: Diagnosis not present

## 2020-12-04 DIAGNOSIS — R7309 Other abnormal glucose: Secondary | ICD-10-CM | POA: Diagnosis not present

## 2020-12-04 DIAGNOSIS — Z0000 Encounter for general adult medical examination without abnormal findings: Secondary | ICD-10-CM | POA: Diagnosis not present

## 2020-12-04 DIAGNOSIS — E039 Hypothyroidism, unspecified: Secondary | ICD-10-CM | POA: Diagnosis not present

## 2020-12-04 DIAGNOSIS — E785 Hyperlipidemia, unspecified: Secondary | ICD-10-CM | POA: Diagnosis not present

## 2020-12-12 DIAGNOSIS — E785 Hyperlipidemia, unspecified: Secondary | ICD-10-CM | POA: Diagnosis not present

## 2020-12-12 DIAGNOSIS — Z1211 Encounter for screening for malignant neoplasm of colon: Secondary | ICD-10-CM | POA: Diagnosis not present

## 2020-12-12 DIAGNOSIS — Z1212 Encounter for screening for malignant neoplasm of rectum: Secondary | ICD-10-CM | POA: Diagnosis not present

## 2020-12-12 DIAGNOSIS — G4733 Obstructive sleep apnea (adult) (pediatric): Secondary | ICD-10-CM | POA: Diagnosis not present

## 2020-12-12 DIAGNOSIS — Z0000 Encounter for general adult medical examination without abnormal findings: Secondary | ICD-10-CM | POA: Diagnosis not present

## 2021-01-02 DIAGNOSIS — H6121 Impacted cerumen, right ear: Secondary | ICD-10-CM | POA: Diagnosis not present

## 2021-01-15 DIAGNOSIS — R413 Other amnesia: Secondary | ICD-10-CM | POA: Diagnosis not present

## 2021-01-15 DIAGNOSIS — E782 Mixed hyperlipidemia: Secondary | ICD-10-CM | POA: Diagnosis not present

## 2021-01-15 DIAGNOSIS — R748 Abnormal levels of other serum enzymes: Secondary | ICD-10-CM | POA: Diagnosis not present

## 2021-01-15 DIAGNOSIS — H25013 Cortical age-related cataract, bilateral: Secondary | ICD-10-CM | POA: Diagnosis not present

## 2021-02-26 DIAGNOSIS — H18613 Keratoconus, stable, bilateral: Secondary | ICD-10-CM | POA: Diagnosis not present

## 2021-02-26 DIAGNOSIS — H18413 Arcus senilis, bilateral: Secondary | ICD-10-CM | POA: Diagnosis not present

## 2021-02-26 DIAGNOSIS — H2513 Age-related nuclear cataract, bilateral: Secondary | ICD-10-CM | POA: Diagnosis not present

## 2021-02-26 DIAGNOSIS — H2512 Age-related nuclear cataract, left eye: Secondary | ICD-10-CM | POA: Diagnosis not present

## 2021-02-26 DIAGNOSIS — H25043 Posterior subcapsular polar age-related cataract, bilateral: Secondary | ICD-10-CM | POA: Diagnosis not present

## 2021-03-11 DIAGNOSIS — R413 Other amnesia: Secondary | ICD-10-CM | POA: Diagnosis not present

## 2021-03-11 DIAGNOSIS — E782 Mixed hyperlipidemia: Secondary | ICD-10-CM | POA: Diagnosis not present

## 2021-03-25 DIAGNOSIS — H6123 Impacted cerumen, bilateral: Secondary | ICD-10-CM | POA: Diagnosis not present

## 2021-04-02 DIAGNOSIS — L280 Lichen simplex chronicus: Secondary | ICD-10-CM | POA: Diagnosis not present

## 2021-04-02 DIAGNOSIS — L820 Inflamed seborrheic keratosis: Secondary | ICD-10-CM | POA: Diagnosis not present

## 2021-04-02 DIAGNOSIS — L218 Other seborrheic dermatitis: Secondary | ICD-10-CM | POA: Diagnosis not present

## 2021-04-02 DIAGNOSIS — L570 Actinic keratosis: Secondary | ICD-10-CM | POA: Diagnosis not present

## 2021-04-10 DIAGNOSIS — G4730 Sleep apnea, unspecified: Secondary | ICD-10-CM | POA: Diagnosis not present

## 2021-04-10 DIAGNOSIS — G4733 Obstructive sleep apnea (adult) (pediatric): Secondary | ICD-10-CM | POA: Diagnosis not present

## 2021-04-10 DIAGNOSIS — Z7982 Long term (current) use of aspirin: Secondary | ICD-10-CM | POA: Diagnosis not present

## 2021-04-10 DIAGNOSIS — Z79899 Other long term (current) drug therapy: Secondary | ICD-10-CM | POA: Diagnosis not present

## 2021-04-10 DIAGNOSIS — H2512 Age-related nuclear cataract, left eye: Secondary | ICD-10-CM | POA: Diagnosis not present

## 2021-04-11 DIAGNOSIS — H2511 Age-related nuclear cataract, right eye: Secondary | ICD-10-CM | POA: Diagnosis not present

## 2021-04-24 DIAGNOSIS — Z7982 Long term (current) use of aspirin: Secondary | ICD-10-CM | POA: Diagnosis not present

## 2021-04-24 DIAGNOSIS — G4730 Sleep apnea, unspecified: Secondary | ICD-10-CM | POA: Diagnosis not present

## 2021-04-24 DIAGNOSIS — G4733 Obstructive sleep apnea (adult) (pediatric): Secondary | ICD-10-CM | POA: Diagnosis not present

## 2021-04-24 DIAGNOSIS — H2511 Age-related nuclear cataract, right eye: Secondary | ICD-10-CM | POA: Diagnosis not present

## 2021-04-24 DIAGNOSIS — Z79899 Other long term (current) drug therapy: Secondary | ICD-10-CM | POA: Diagnosis not present

## 2021-04-27 ENCOUNTER — Emergency Department (HOSPITAL_COMMUNITY)
Admission: EM | Admit: 2021-04-27 | Discharge: 2021-04-27 | Disposition: A | Discharge status: Home / Self Care | Payer: Medicare Other | Attending: Emergency Medicine | Admitting: Emergency Medicine

## 2021-04-27 ENCOUNTER — Emergency Department (HOSPITAL_COMMUNITY): Payer: Medicare Other

## 2021-04-27 ENCOUNTER — Other Ambulatory Visit: Payer: Self-pay

## 2021-04-27 ENCOUNTER — Encounter (HOSPITAL_COMMUNITY): Payer: Self-pay | Admitting: Pharmacy Technician

## 2021-04-27 DIAGNOSIS — Z743 Need for continuous supervision: Secondary | ICD-10-CM | POA: Diagnosis not present

## 2021-04-27 DIAGNOSIS — Z043 Encounter for examination and observation following other accident: Secondary | ICD-10-CM | POA: Diagnosis not present

## 2021-04-27 DIAGNOSIS — R4020 Unspecified coma: Secondary | ICD-10-CM | POA: Diagnosis not present

## 2021-04-27 DIAGNOSIS — S4992XA Unspecified injury of left shoulder and upper arm, initial encounter: Secondary | ICD-10-CM | POA: Diagnosis present

## 2021-04-27 DIAGNOSIS — W01198A Fall on same level from slipping, tripping and stumbling with subsequent striking against other object, initial encounter: Secondary | ICD-10-CM | POA: Diagnosis not present

## 2021-04-27 DIAGNOSIS — Z20822 Contact with and (suspected) exposure to covid-19: Secondary | ICD-10-CM | POA: Diagnosis not present

## 2021-04-27 DIAGNOSIS — R569 Unspecified convulsions: Secondary | ICD-10-CM | POA: Diagnosis not present

## 2021-04-27 DIAGNOSIS — R404 Transient alteration of awareness: Secondary | ICD-10-CM | POA: Diagnosis not present

## 2021-04-27 DIAGNOSIS — Z7982 Long term (current) use of aspirin: Secondary | ICD-10-CM | POA: Insufficient documentation

## 2021-04-27 DIAGNOSIS — R55 Syncope and collapse: Secondary | ICD-10-CM | POA: Diagnosis not present

## 2021-04-27 DIAGNOSIS — S40012A Contusion of left shoulder, initial encounter: Secondary | ICD-10-CM | POA: Diagnosis not present

## 2021-04-27 DIAGNOSIS — S0990XA Unspecified injury of head, initial encounter: Secondary | ICD-10-CM | POA: Diagnosis not present

## 2021-04-27 DIAGNOSIS — R410 Disorientation, unspecified: Secondary | ICD-10-CM | POA: Diagnosis not present

## 2021-04-27 DIAGNOSIS — I4510 Unspecified right bundle-branch block: Secondary | ICD-10-CM | POA: Diagnosis not present

## 2021-04-27 DIAGNOSIS — W19XXXA Unspecified fall, initial encounter: Secondary | ICD-10-CM

## 2021-04-27 DIAGNOSIS — R918 Other nonspecific abnormal finding of lung field: Secondary | ICD-10-CM | POA: Diagnosis not present

## 2021-04-27 DIAGNOSIS — M25512 Pain in left shoulder: Secondary | ICD-10-CM | POA: Diagnosis not present

## 2021-04-27 LAB — CBC WITH DIFFERENTIAL/PLATELET
Abs Immature Granulocytes: 0.1 10*3/uL — ABNORMAL HIGH (ref 0.00–0.07)
Basophils Absolute: 0.1 10*3/uL (ref 0.0–0.1)
Basophils Relative: 1 %
Eosinophils Absolute: 0 10*3/uL (ref 0.0–0.5)
Eosinophils Relative: 1 %
HCT: 45.9 % (ref 39.0–52.0)
Hemoglobin: 15.4 g/dL (ref 13.0–17.0)
Immature Granulocytes: 2 %
Lymphocytes Relative: 9 %
Lymphs Abs: 0.5 10*3/uL — ABNORMAL LOW (ref 0.7–4.0)
MCH: 34.7 pg — ABNORMAL HIGH (ref 26.0–34.0)
MCHC: 33.6 g/dL (ref 30.0–36.0)
MCV: 103.4 fL — ABNORMAL HIGH (ref 80.0–100.0)
Monocytes Absolute: 0.4 10*3/uL (ref 0.1–1.0)
Monocytes Relative: 6 %
Neutro Abs: 4.7 10*3/uL (ref 1.7–7.7)
Neutrophils Relative %: 81 %
Platelets: 204 10*3/uL (ref 150–400)
RBC: 4.44 MIL/uL (ref 4.22–5.81)
RDW: 13.2 % (ref 11.5–15.5)
WBC: 5.8 10*3/uL (ref 4.0–10.5)
nRBC: 0 % (ref 0.0–0.2)

## 2021-04-27 LAB — COMPREHENSIVE METABOLIC PANEL
ALT: 22 U/L (ref 0–44)
AST: 32 U/L (ref 15–41)
Albumin: 2.9 g/dL — ABNORMAL LOW (ref 3.5–5.0)
Alkaline Phosphatase: 61 U/L (ref 38–126)
Anion gap: 8 (ref 5–15)
BUN: 9 mg/dL (ref 6–20)
CO2: 24 mmol/L (ref 22–32)
Calcium: 8.5 mg/dL — ABNORMAL LOW (ref 8.9–10.3)
Chloride: 106 mmol/L (ref 98–111)
Creatinine, Ser: 0.92 mg/dL (ref 0.61–1.24)
GFR, Estimated: >60 mL/min (ref >60)
Glucose, Bld: 98 mg/dL (ref 70–99)
Potassium: 3.8 mmol/L (ref 3.5–5.1)
Sodium: 138 mmol/L (ref 135–145)
Total Bilirubin: 0.5 mg/dL (ref 0.3–1.2)
Total Protein: 6.5 g/dL (ref 6.5–8.1)

## 2021-04-27 LAB — I-STAT VENOUS BLOOD GAS, ED
Acid-Base Excess: 2 mmol/L (ref 0.0–2.0)
Bicarbonate: 28.2 mmol/L — ABNORMAL HIGH (ref 20.0–28.0)
Calcium, Ion: 1.13 mmol/L — ABNORMAL LOW (ref 1.15–1.40)
HCT: 44 % (ref 39.0–52.0)
Hemoglobin: 15 g/dL (ref 13.0–17.0)
O2 Saturation: 88 %
Potassium: 3.7 mmol/L (ref 3.5–5.1)
Sodium: 142 mmol/L (ref 135–145)
TCO2: 30 mmol/L (ref 22–32)
pCO2, Ven: 47 mmHg (ref 44.0–60.0)
pH, Ven: 7.387 (ref 7.250–7.430)
pO2, Ven: 56 mmHg — ABNORMAL HIGH (ref 32.0–45.0)

## 2021-04-27 LAB — TROPONIN I (HIGH SENSITIVITY)
Troponin I (High Sensitivity): 6 ng/L (ref <18)
Troponin I (High Sensitivity): 8 ng/L (ref <18)

## 2021-04-27 LAB — RESP PANEL BY RT-PCR (FLU A&B, COVID) ARPGX2
Influenza A by PCR: NEGATIVE
Influenza B by PCR: NEGATIVE
SARS Coronavirus 2 by RT PCR: NEGATIVE

## 2021-04-27 LAB — LACTIC ACID, PLASMA
Lactic Acid, Venous: 2.2 mmol/L (ref 0.5–1.9)
Lactic Acid, Venous: 1.5 mmol/L (ref 0.5–1.9)

## 2021-04-27 LAB — PROTIME-INR
INR: 1 (ref 0.8–1.2)
Prothrombin Time: 13.6 seconds (ref 11.4–15.2)

## 2021-04-27 LAB — AMMONIA: Ammonia: 31 umol/L (ref 9–35)

## 2021-04-27 IMAGING — DX DG CHEST 1V PORT
1 series · 1 of 1 positions shown · non-contrast
Comparison: [DATE]

CLINICAL DATA: Fall.

EXAM:
PORTABLE CHEST 1 VIEW

[chest]
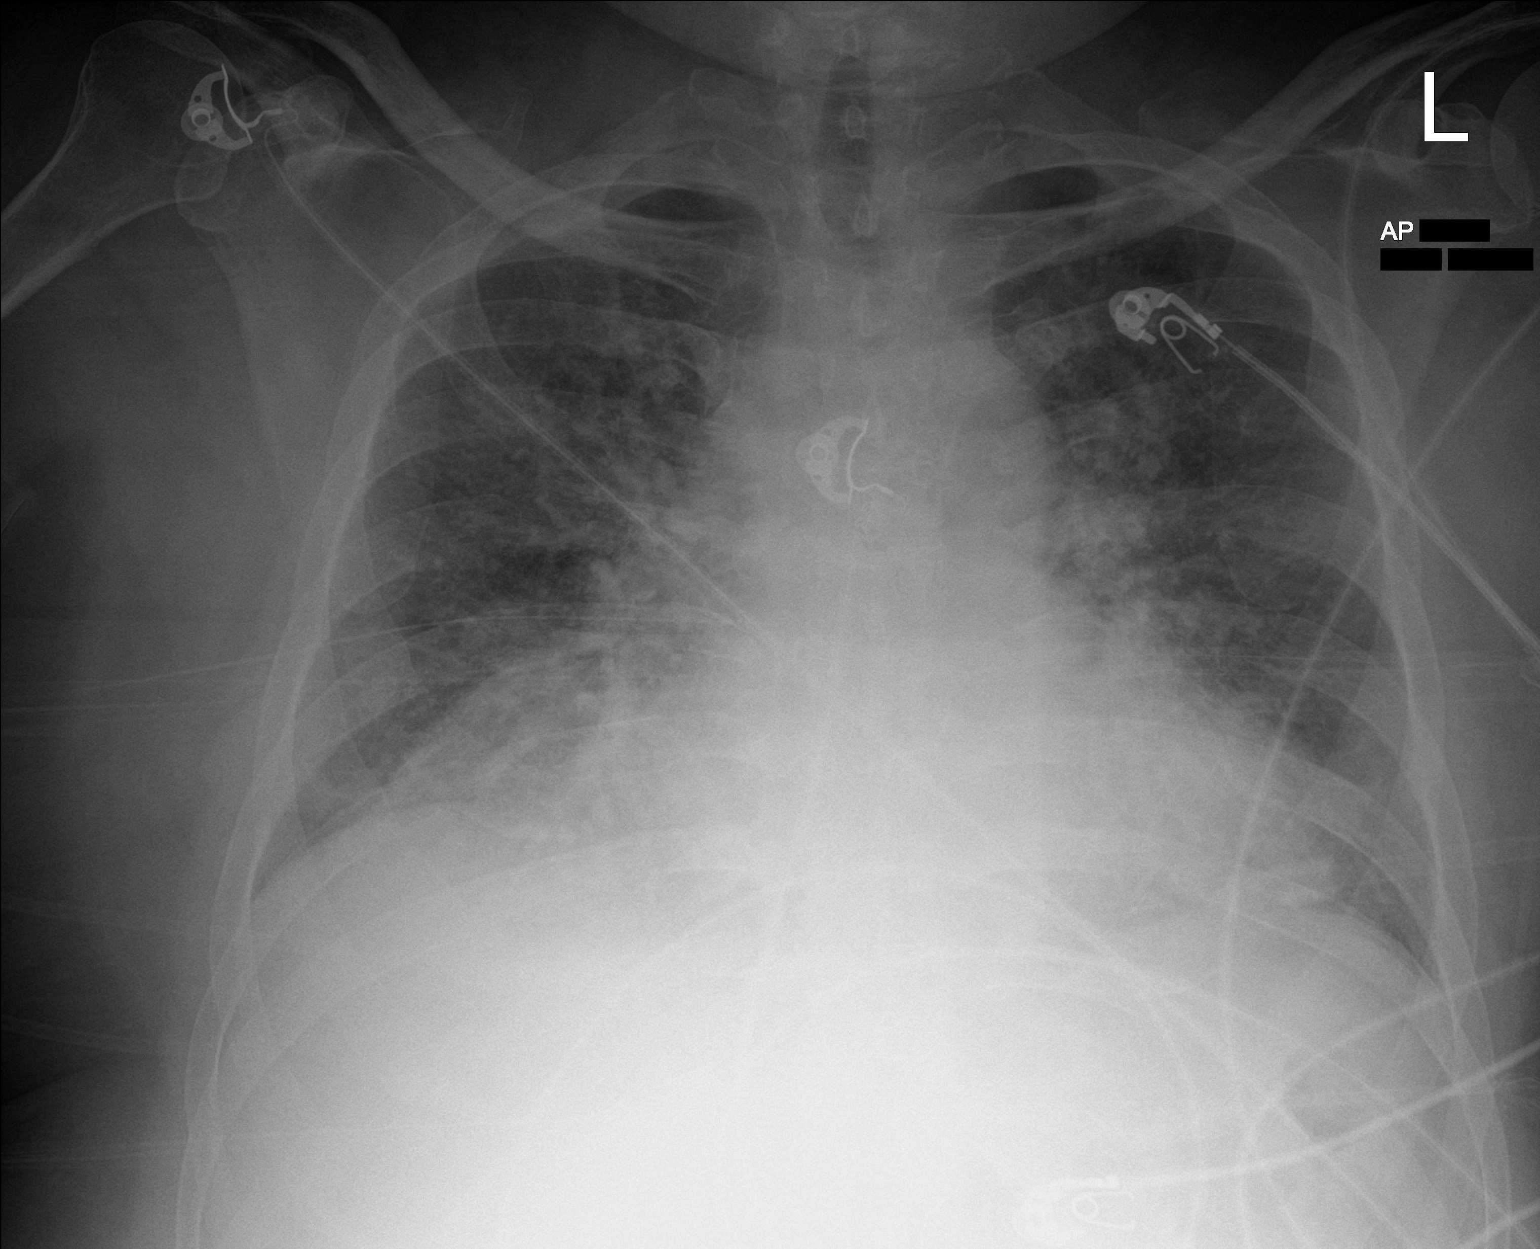

[1 of 1 positions shown; findings below may reference images not displayed]

FINDINGS: The cardiomediastinal silhouette is stable. Opacity in the medial
right lung base is stable, likely atelectasis, scar, or a prominent
fat pad. Increased interstitial markings in the lungs. No focal
infiltrate. No other acute abnormalities.
IMPRESSION: Increased interstitial opacities in the lungs suggestive of
pulmonary venous congestion or mild edema. A better volume PA and
lateral chest x-ray could better evaluate.

## 2021-04-27 IMAGING — CT CT HEAD W/O CM
3 series · 16 of 47 positions shown, 19 images · non-contrast
Comparison: [DATE].

CLINICAL DATA: Unwitnessed fall.

EXAM:
CT HEAD WITHOUT CONTRAST
TECHNIQUE: Contiguous axial images were obtained from the base of the skull
through the vertex without intravenous contrast.

[Series 4: head 5.0 h30s · axial · 0.41mm/px · z∈[-59,+71]mm · 10 of 32 slices shown, 13 images]
[im 3/32  brain]
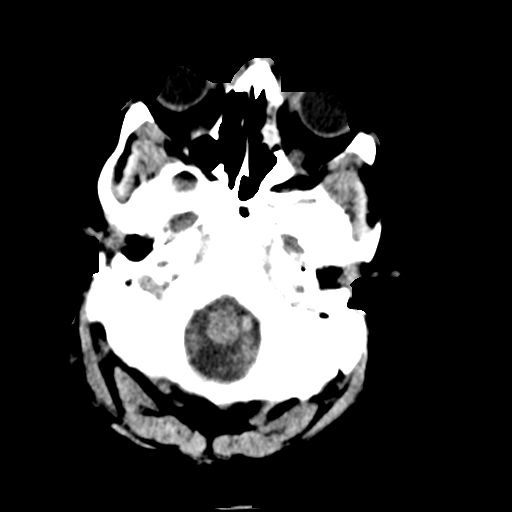
[im 3/32  bone]
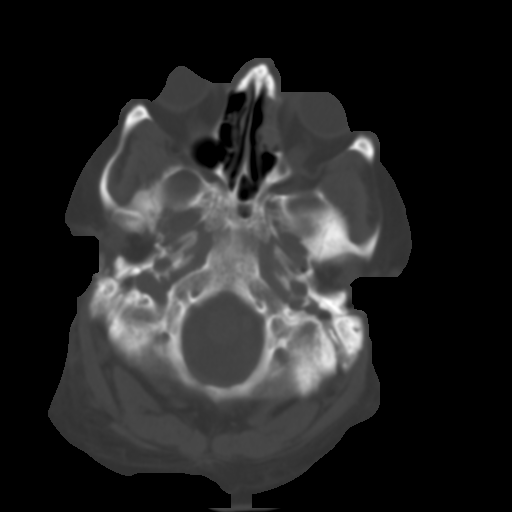
[im 6/32  brain]
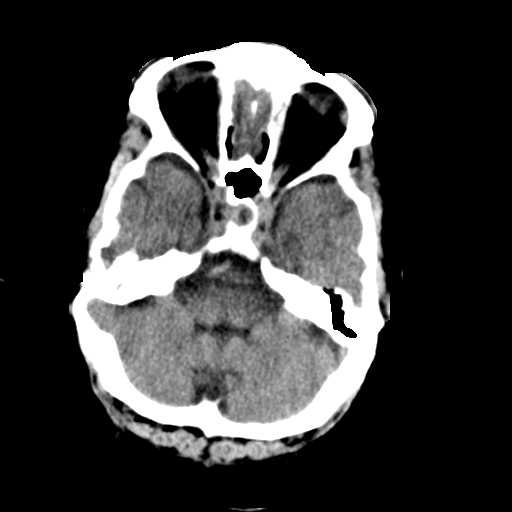
[im 9/32  brain]
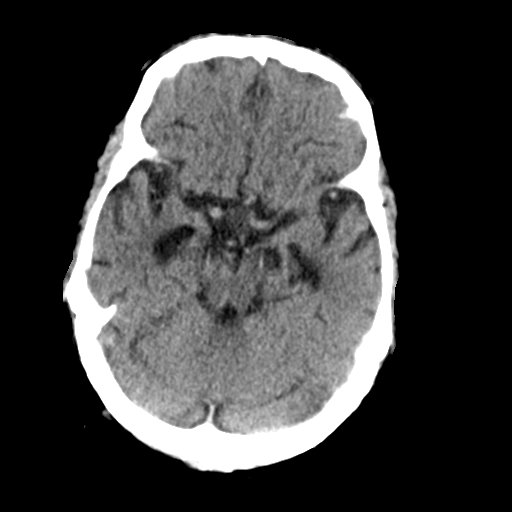
[im 11/32  brain]
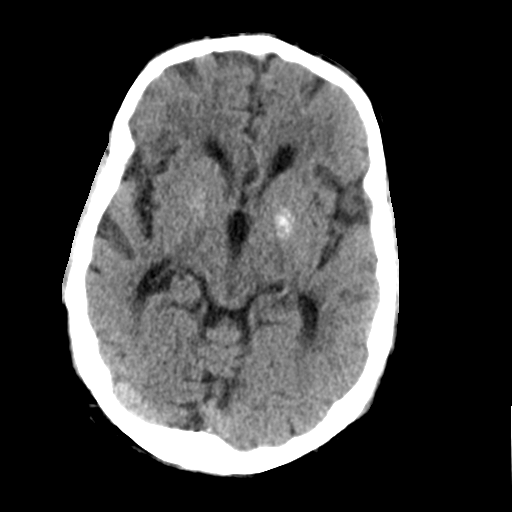
[im 14/32  brain]
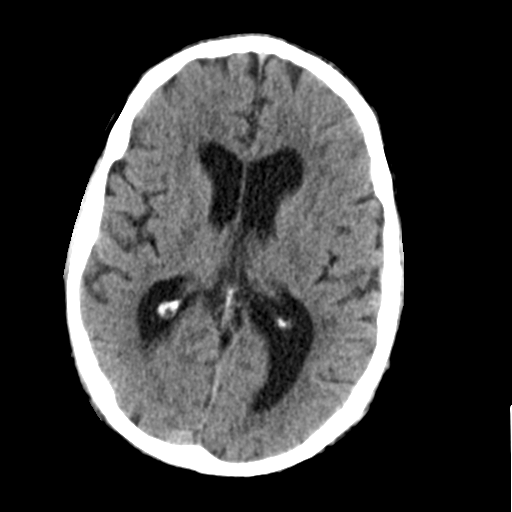
[im 14/32  bone]
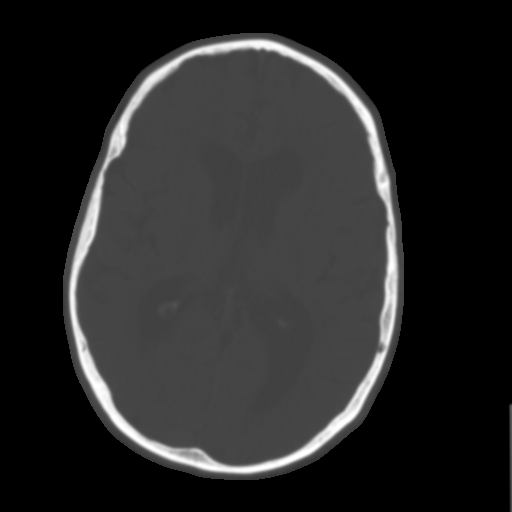
[im 18/32  brain]
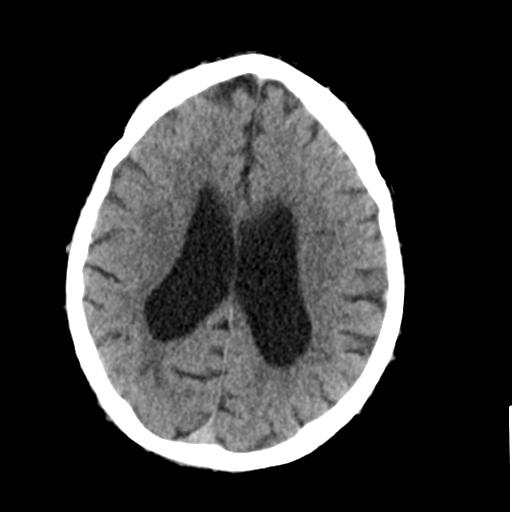
[im 21/32  brain]
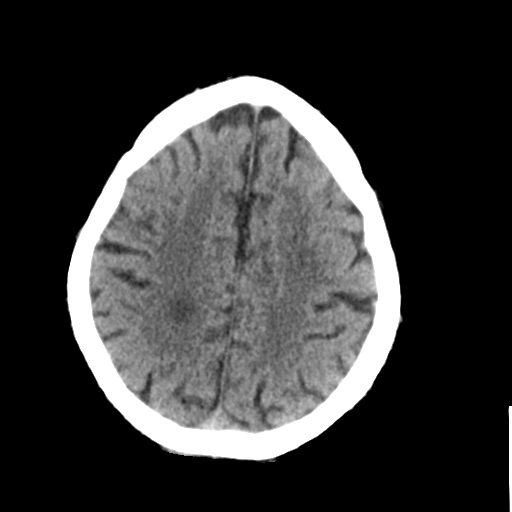
[im 24/32  brain]
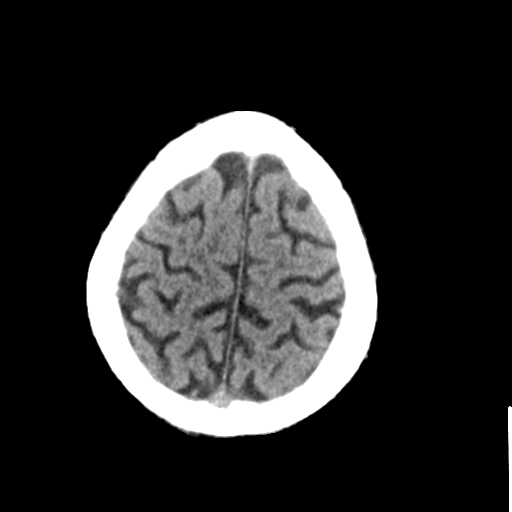
[im 26/32  brain]
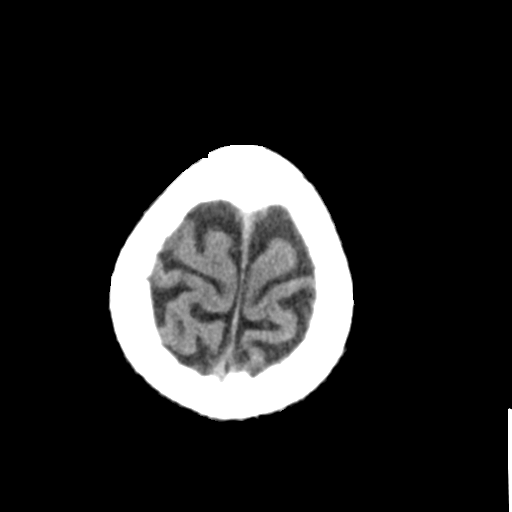
[im 26/32  bone]
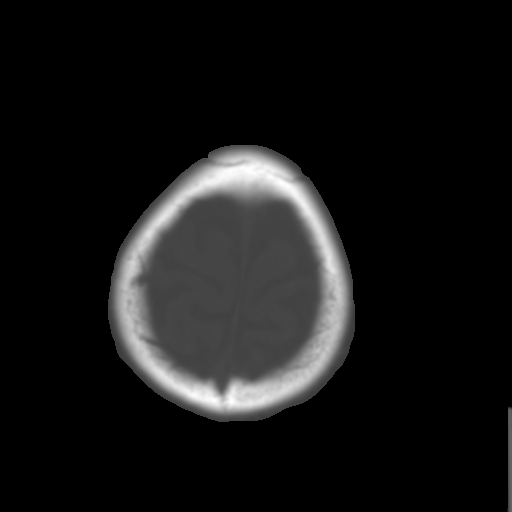
[im 29/32  brain]
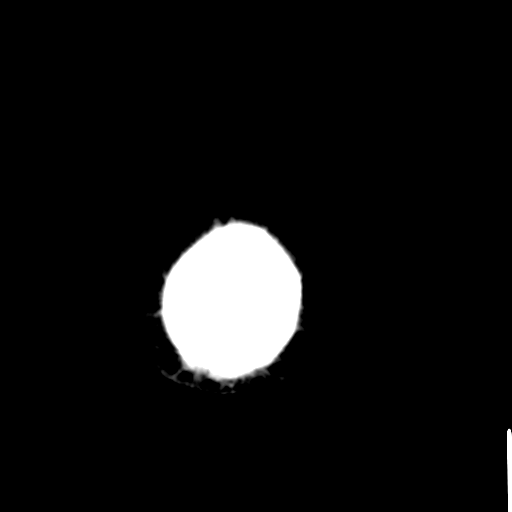

[Series 5: head 3.0 mpr cor · coronal · 0.32mm/px · 3 of 67 slices shown]
[im 23/67  brain]
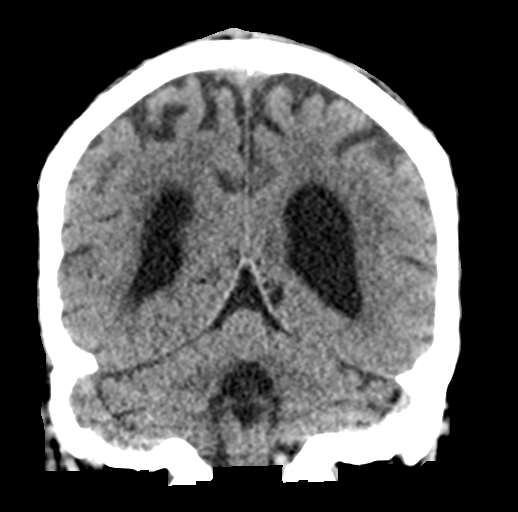
[im 30/67  brain]
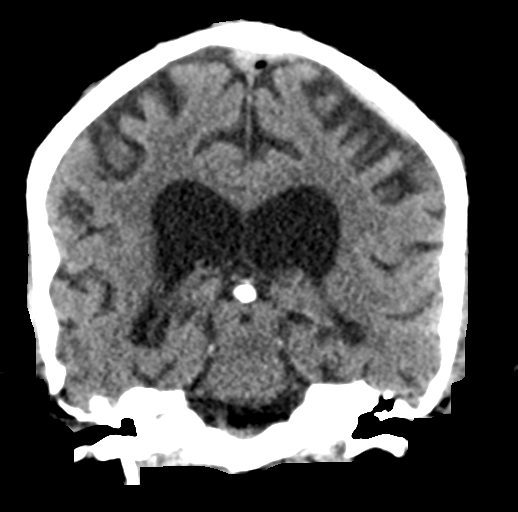
[im 37/67  brain]
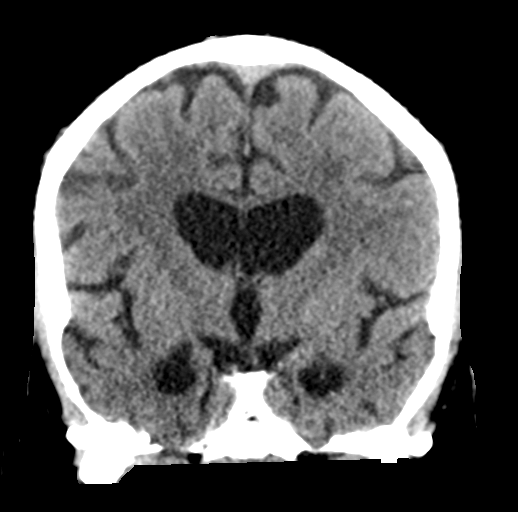

[Series 6: head 3.0 mpr sag · sagittal · 0.32mm/px · 3 of 53 slices shown]
[im 18/53  brain]
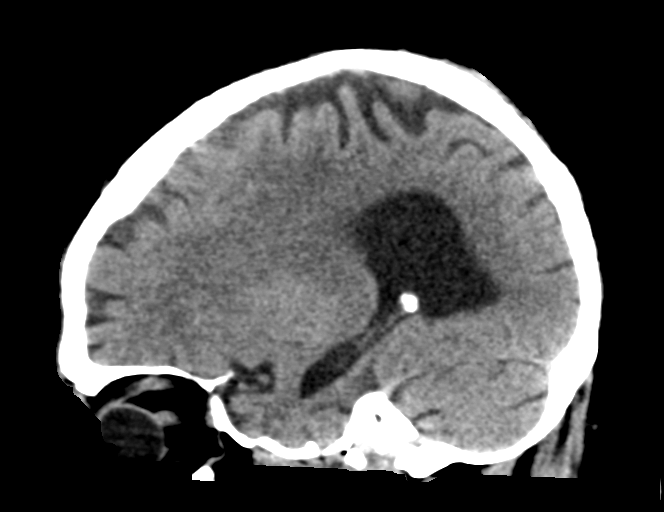
[im 27/53  brain]
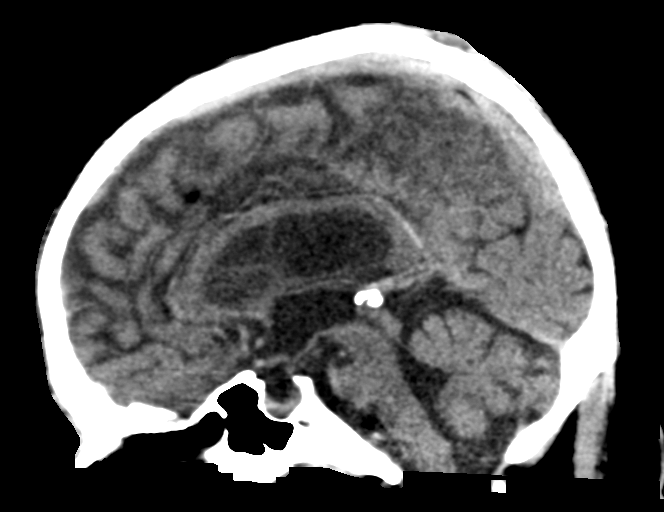
[im 35/53  brain]
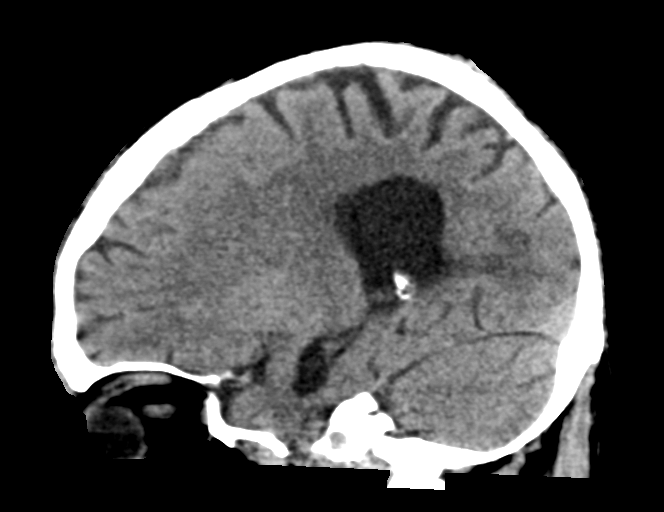

[16 of 47 positions shown; findings below may reference images not displayed]

FINDINGS: Brain: No evidence of acute infarction, hemorrhage, hydrocephalus,
extra-axial collection or mass lesion/mass effect.

Vascular: No hyperdense vessel or unexpected calcification.

Skull: Normal. Negative for fracture or focal lesion.

Sinuses/Orbits: No acute finding.

Other: None.
IMPRESSION: No acute intracranial abnormality seen.

## 2021-04-27 MED ORDER — ACETAMINOPHEN 500 MG PO TABS
1000.0000 mg | ORAL_TABLET | Freq: Once | ORAL | Status: AC
  Administered 2021-04-27: 1000 mg via ORAL
  Filled 2021-04-27: qty 2

## 2021-04-27 NOTE — ED Provider Notes (Signed)
[facility]ERGENCY DEPARTMENT Provider Note   CSN: [id number] Arrival date & time: 04/27/21  1040     History No chief complaint on file.   Joseph Williams is a 55 y.o. male.  HPI Patient's mother heard him collapse in the adjacent room.  He had been going to the bathroom and seem to be putting on his pants.  She reports he fell into the closet and probably struck his head on the closet door and his shoulder on the floor.  She reports a physician he was in she had difficulty rolling over.  She reports that he was cyanotic in appearance.  He was not really responding.  She called EMS.  Instructions were given to start chest compressions.  Patient's mom reports she did that and his color started to improve.  She did not see him have any actual seizure activity.  By the time EMS arrived patient was alert and slightly confused he was breathing spontaneously no further supportive measures were indicated.  Patient has Down syndrome and is a limited historian.  His mother reports he has a very high pain tolerance and does not really complain.  He has been well recently.  There has been no indication that he had fevers cough no chest pain problems.    Past Medical History:  Diagnosis Date   Down's syndrome    H/O echocardiogram 01/12/2012   Normal left ventricular wall thickness; Left ventricular systolic function is normal;  there is trace mitral and tricuspid regurgitation. EF 74%   High cholesterol    Persistent pneumonia     Patient Active Problem List   Diagnosis Date Noted   OSA (obstructive sleep apnea) 02/02/2020   Pneumonia, organism unspecified(486) 12/23/2011   Down's syndrome 11/25/2011    Past Surgical History:  Procedure Laterality Date   HERNIA REPAIR  2006   bilateal inguinal and ventral       Family History  Problem Relation Age of Onset   Heart attack Father    Breast cancer Mother        x 3   Throat cancer Maternal Grandfather      Social History   Tobacco Use   Smoking status: Never   Smokeless tobacco: Never  Substance Use Topics   Alcohol use: No   Drug use: No    Home Medications Prior to Admission medications   Medication Sig Start Date End Date Taking? Authorizing Provider  aspirin 81 MG tablet Take 81 mg by mouth daily.    [provider]  cholecalciferol (VITAMIN D) 1000 UNITS tablet Take 1,000 Units by mouth daily.    [provider]  Cyanocobalamin (B-12) 1000 MCG CAPS Take by mouth daily.    [provider]  Rosuvastatin Calcium (CRESTOR PO) Take by mouth.    [provider]  simvastatin (ZOCOR) 40 MG tablet Take 40 mg by mouth every evening.    [provider]    Allergies    Patient has no known allergies.  Review of Systems   Review of Systems Level 5 caveat review of systems obtained from the patient's mother.  Patient cannot give full review of systems due to cognitive impairment Physical Exam Updated Vital Signs BP 128/82   Pulse 75   Temp (!) 97.2 F (36.2 C) (Temporal)   Resp 16   SpO2 96%   Physical Exam Constitutional:      Comments: Patient alert nontoxic.  No respiratory distress.  HENT:  Head: Normocephalic and atraumatic.     Nose: Nose normal.     Mouth/Throat:     Mouth: Mucous membranes are moist.     Pharynx: Oropharynx is clear.  Eyes:     Extraocular Movements: Extraocular movements intact.  Cardiovascular:     Rate and Rhythm: Normal rate and regular rhythm.  Pulmonary:     Effort: Pulmonary effort is normal.     Breath sounds: Normal breath sounds.  Abdominal:     General: There is no distension.     Palpations: Abdomen is soft.     Tenderness: There is no abdominal tenderness. There is no guarding.  Musculoskeletal:        General: No swelling or tenderness. Normal range of motion.     Right lower leg: No edema.     Left lower leg: No edema.  Skin:    General: Skin is warm and dry.  Neurological:      General: No focal deficit present.     Cranial Nerves: No cranial nerve deficit.     Coordination: Coordination normal.  Psychiatric:        Mood and Affect: Mood normal.    ED Results / Procedures / Treatments   Labs (all labs ordered are listed, but only abnormal results are displayed) Labs Reviewed  COMPREHENSIVE METABOLIC PANEL - Abnormal; Notable for the following components:      Result Value   Calcium 8.5 (*)    Albumin 2.9 (*)    All other components within normal limits  LACTIC ACID, PLASMA - Abnormal; Notable for the following components:   Lactic Acid, Venous 2.2 (*)    All other components within normal limits  CBC WITH DIFFERENTIAL/PLATELET - Abnormal; Notable for the following components:   MCV 103.4 (*)    MCH 34.7 (*)    Lymphs Abs 0.5 (*)    Abs Immature Granulocytes 0.10 (*)    All other components within normal limits  I-STAT VENOUS BLOOD GAS, ED - Abnormal; Notable for the following components:   pO2, Ven 56.0 (*)    Bicarbonate 28.2 (*)    Calcium, Ion 1.13 (*)    All other components within normal limits  RESP PANEL BY RT-PCR (FLU A&B, COVID) ARPGX2  PROTIME-INR  AMMONIA  LACTIC ACID, PLASMA  URINALYSIS, ROUTINE W REFLEX MICROSCOPIC  BLOOD GAS, VENOUS  TROPONIN I (HIGH SENSITIVITY)  TROPONIN I (HIGH SENSITIVITY)    EKG EKG Interpretation  Date/Time:  Saturday April 27 2021 11:50:09 EDT Ventricular Rate:  72 PR Interval:  154 QRS Duration: 123 QT Interval:  400 QTC Calculation: 438 R Axis:   -84 Text Interpretation: Sinus rhythm Probable left atrial enlargement RBBB and LAFB Baseline wander in lead(s) V1 V2 agree, no acute ischemic appearance Confirmed by Charlesetta Shanks [phone]) on 04/27/2021 2:11:24 PM  Radiology CT Head Wo Contrast  Result Date: 04/27/2021 CLINICAL DATA:  Unwitnessed fall. EXAM: CT HEAD WITHOUT CONTRAST TECHNIQUE: Contiguous axial images were obtained from the base of the skull through the vertex without intravenous  contrast. COMPARISON:  December 13, 2013. FINDINGS: Brain: No evidence of acute infarction, hemorrhage, hydrocephalus, extra-axial collection or mass lesion/mass effect. Vascular: No hyperdense vessel or unexpected calcification. Skull: Normal. Negative for fracture or focal lesion. Sinuses/Orbits: No acute finding. Other: None. IMPRESSION: No acute intracranial abnormality seen. Electronically Signed   By: Marijo Conception M.D.   On: 04/27/2021 11:58   DG Chest Port 1 View  Result Date: 04/27/2021 CLINICAL DATA:  Fall.  EXAM: PORTABLE CHEST 1 VIEW COMPARISON:  December 13, 2013 FINDINGS: The cardiomediastinal silhouette is stable. Opacity in the medial right lung base is stable, likely atelectasis, scar, or a prominent fat pad. Increased interstitial markings in the lungs. No focal infiltrate. No other acute abnormalities. IMPRESSION: Increased interstitial opacities in the lungs suggestive of pulmonary venous congestion or mild edema. A better volume PA and lateral chest x-ray could better evaluate. Electronically Signed   By: Dorise Bullion III M.D   On: 04/27/2021 12:16   DG Shoulder Left  Result Date: 04/27/2021 CLINICAL DATA:  Fall with left shoulder pain. EXAM: LEFT SHOULDER - 2+ VIEW COMPARISON:  None. FINDINGS: There is no evidence of fracture or dislocation. There is no evidence of arthropathy or other focal bone abnormality. Soft tissues are unremarkable. IMPRESSION: Negative. Electronically Signed   By: Zerita Boers M.D.   On: 04/27/2021 15:00    Procedures Procedures   Medications Ordered in ED Medications  acetaminophen (TYLENOL) tablet 1,000 mg (1,000 mg Oral Given 04/27/21 1504)    ED Course  I have reviewed the triage vital signs and the nursing notes.  Pertinent labs & imaging results that were available during my care of the patient were reviewed by me and considered in my medical decision making (see chart for details).    MDM Rules/Calculators/A&P                            Patient had an episode of fall or syncope.  This was unwitnessed but patient's mother was in the next room.  Unclear if this was syncopal or mechanical fall.  CT head negative.  No significant trauma findings.  EKG does not show STEMI pattern.  First troponin normal.  We will proceed with second troponin.  Other labs within normal limits.  Review of systems negative.  Patient is back to baseline.  Patient's mother reports that his mental status and basic demeanor are normal now.  Anticipate if second troponin negative plan for discharge.  Return precautions reviewed.  Patient will need close follow-up with PCP. Final Clinical Impression(s) / ED Diagnoses Final diagnoses:  Syncope and collapse  Fall, initial encounter  Contusion of left shoulder, initial encounter    Rx / DC Orders ED Discharge Orders     None        Charlesetta Shanks, MD 04/27/21 [phone]

## 2021-04-27 NOTE — ED Triage Notes (Signed)
Pt here with reports of unwitnessed fall today when he was going to the bathroom. Mother then states pt was unresponsive. On scene, fire dept assisting ventilations due to agonal resp. EMS reports by the time they got there, pt alert and confused, breathing on his own. VSS with EMS. Pt with oral trauma and incontinence. No witnessed seizure like activity. Pt alert and oriented to baseline on arrival to ED.

## 2021-04-27 NOTE — ED Notes (Signed)
Lactic Acid 2.2- Dr. Johnney Killian and Donella Stade, RN notified

## 2021-04-27 NOTE — ED Notes (Signed)
Patient Alert and oriented to baseline. Stable and ambulatory to baseline. Patient verbalized understanding of the discharge instructions.  Patient belongings were taken by the patient.   

## 2021-05-03 DIAGNOSIS — H2511 Age-related nuclear cataract, right eye: Secondary | ICD-10-CM | POA: Diagnosis not present

## 2021-05-07 ENCOUNTER — Encounter (HOSPITAL_COMMUNITY): Payer: Self-pay | Admitting: Emergency Medicine

## 2021-05-07 ENCOUNTER — Emergency Department (HOSPITAL_COMMUNITY)
Admission: EM | Admit: 2021-05-07 | Discharge: 2021-05-07 | Disposition: A | Discharge status: Home / Self Care | Payer: Medicare Other | Attending: Emergency Medicine | Admitting: Emergency Medicine

## 2021-05-07 ENCOUNTER — Emergency Department (HOSPITAL_COMMUNITY): Payer: Medicare Other

## 2021-05-07 ENCOUNTER — Other Ambulatory Visit: Payer: Self-pay

## 2021-05-07 DIAGNOSIS — S0081XA Abrasion of other part of head, initial encounter: Secondary | ICD-10-CM | POA: Insufficient documentation

## 2021-05-07 DIAGNOSIS — W19XXXA Unspecified fall, initial encounter: Secondary | ICD-10-CM | POA: Diagnosis not present

## 2021-05-07 DIAGNOSIS — S92421A Displaced fracture of distal phalanx of right great toe, initial encounter for closed fracture: Secondary | ICD-10-CM | POA: Diagnosis not present

## 2021-05-07 DIAGNOSIS — Z79899 Other long term (current) drug therapy: Secondary | ICD-10-CM | POA: Insufficient documentation

## 2021-05-07 DIAGNOSIS — R404 Transient alteration of awareness: Secondary | ICD-10-CM | POA: Diagnosis not present

## 2021-05-07 DIAGNOSIS — R569 Unspecified convulsions: Secondary | ICD-10-CM | POA: Diagnosis not present

## 2021-05-07 DIAGNOSIS — L817 Pigmented purpuric dermatosis: Secondary | ICD-10-CM | POA: Insufficient documentation

## 2021-05-07 DIAGNOSIS — S92324A Nondisplaced fracture of second metatarsal bone, right foot, initial encounter for closed fracture: Secondary | ICD-10-CM | POA: Diagnosis not present

## 2021-05-07 DIAGNOSIS — S92301A Fracture of unspecified metatarsal bone(s), right foot, initial encounter for closed fracture: Secondary | ICD-10-CM | POA: Diagnosis not present

## 2021-05-07 DIAGNOSIS — Z7982 Long term (current) use of aspirin: Secondary | ICD-10-CM | POA: Diagnosis not present

## 2021-05-07 DIAGNOSIS — S90111A Contusion of right great toe without damage to nail, initial encounter: Secondary | ICD-10-CM | POA: Insufficient documentation

## 2021-05-07 DIAGNOSIS — R0902 Hypoxemia: Secondary | ICD-10-CM | POA: Diagnosis not present

## 2021-05-07 DIAGNOSIS — Y900 Blood alcohol level of less than 20 mg/100 ml: Secondary | ICD-10-CM | POA: Diagnosis not present

## 2021-05-07 DIAGNOSIS — S99921A Unspecified injury of right foot, initial encounter: Secondary | ICD-10-CM | POA: Diagnosis present

## 2021-05-07 DIAGNOSIS — R9431 Abnormal electrocardiogram [ECG] [EKG]: Secondary | ICD-10-CM | POA: Diagnosis not present

## 2021-05-07 DIAGNOSIS — R233 Spontaneous ecchymoses: Secondary | ICD-10-CM

## 2021-05-07 DIAGNOSIS — S92321A Displaced fracture of second metatarsal bone, right foot, initial encounter for closed fracture: Secondary | ICD-10-CM

## 2021-05-07 DIAGNOSIS — Z743 Need for continuous supervision: Secondary | ICD-10-CM | POA: Diagnosis not present

## 2021-05-07 LAB — CBC WITH DIFFERENTIAL/PLATELET
Abs Immature Granulocytes: 0.04 10*3/uL (ref 0.00–0.07)
Basophils Absolute: 0.1 10*3/uL (ref 0.0–0.1)
Basophils Relative: 1 %
Eosinophils Absolute: 0 10*3/uL (ref 0.0–0.5)
Eosinophils Relative: 1 %
HCT: 41.9 % (ref 39.0–52.0)
Hemoglobin: 13.6 g/dL (ref 13.0–17.0)
Immature Granulocytes: 1 %
Lymphocytes Relative: 11 %
Lymphs Abs: 0.5 10*3/uL — ABNORMAL LOW (ref 0.7–4.0)
MCH: 33.9 pg (ref 26.0–34.0)
MCHC: 32.5 g/dL (ref 30.0–36.0)
MCV: 104.5 fL — ABNORMAL HIGH (ref 80.0–100.0)
Monocytes Absolute: 0.4 10*3/uL (ref 0.1–1.0)
Monocytes Relative: 9 %
Neutro Abs: 3.6 10*3/uL (ref 1.7–7.7)
Neutrophils Relative %: 77 %
Platelets: 266 10*3/uL (ref 150–400)
RBC: 4.01 MIL/uL — ABNORMAL LOW (ref 4.22–5.81)
RDW: 13 % (ref 11.5–15.5)
WBC: 4.6 10*3/uL (ref 4.0–10.5)
nRBC: 0 % (ref 0.0–0.2)

## 2021-05-07 LAB — CBG MONITORING, ED: Glucose-Capillary: 106 mg/dL — ABNORMAL HIGH (ref 70–99)

## 2021-05-07 LAB — COMPREHENSIVE METABOLIC PANEL
ALT: 19 U/L (ref 0–44)
AST: 24 U/L (ref 15–41)
Albumin: 2.7 g/dL — ABNORMAL LOW (ref 3.5–5.0)
Alkaline Phosphatase: 89 U/L (ref 38–126)
Anion gap: 8 (ref 5–15)
BUN: 12 mg/dL (ref 6–20)
CO2: 24 mmol/L (ref 22–32)
Calcium: 8.3 mg/dL — ABNORMAL LOW (ref 8.9–10.3)
Chloride: 106 mmol/L (ref 98–111)
Creatinine, Ser: 0.91 mg/dL (ref 0.61–1.24)
GFR, Estimated: >60 mL/min (ref >60)
Glucose, Bld: 101 mg/dL — ABNORMAL HIGH (ref 70–99)
Potassium: 3.8 mmol/L (ref 3.5–5.1)
Sodium: 138 mmol/L (ref 135–145)
Total Bilirubin: 1 mg/dL (ref 0.3–1.2)
Total Protein: 6.4 g/dL — ABNORMAL LOW (ref 6.5–8.1)

## 2021-05-07 LAB — PROTIME-INR
INR: 1 (ref 0.8–1.2)
Prothrombin Time: 13.4 seconds (ref 11.4–15.2)

## 2021-05-07 LAB — URINALYSIS, ROUTINE W REFLEX MICROSCOPIC
Bilirubin Urine: NEGATIVE
Glucose, UA: NEGATIVE mg/dL
Hgb urine dipstick: NEGATIVE
Ketones, ur: NEGATIVE mg/dL
Leukocytes,Ua: NEGATIVE
Nitrite: NEGATIVE
Protein, ur: NEGATIVE mg/dL
Specific Gravity, Urine: 1.01 (ref 1.005–1.030)
pH: 7 (ref 5.0–8.0)

## 2021-05-07 LAB — MAGNESIUM: Magnesium: 2.2 mg/dL (ref 1.7–2.4)

## 2021-05-07 LAB — RAPID URINE DRUG SCREEN, HOSP PERFORMED
Amphetamines: NOT DETECTED
Barbiturates: NOT DETECTED
Benzodiazepines: NOT DETECTED
Cocaine: NOT DETECTED
Opiates: NOT DETECTED
Tetrahydrocannabinol: NOT DETECTED

## 2021-05-07 LAB — TROPONIN I (HIGH SENSITIVITY): Troponin I (High Sensitivity): 5 ng/L (ref <18)

## 2021-05-07 LAB — ETHANOL: Alcohol, Ethyl (B): <10 mg/dL (ref <10)

## 2021-05-07 IMAGING — CR DG TOE GREAT 2+V*R*
3 series · 3 of 3 positions shown · non-contrast
Comparison: None.

CLINICAL DATA: Bruising after fall

EXAM:
RIGHT GREAT TOE

[toe ap]
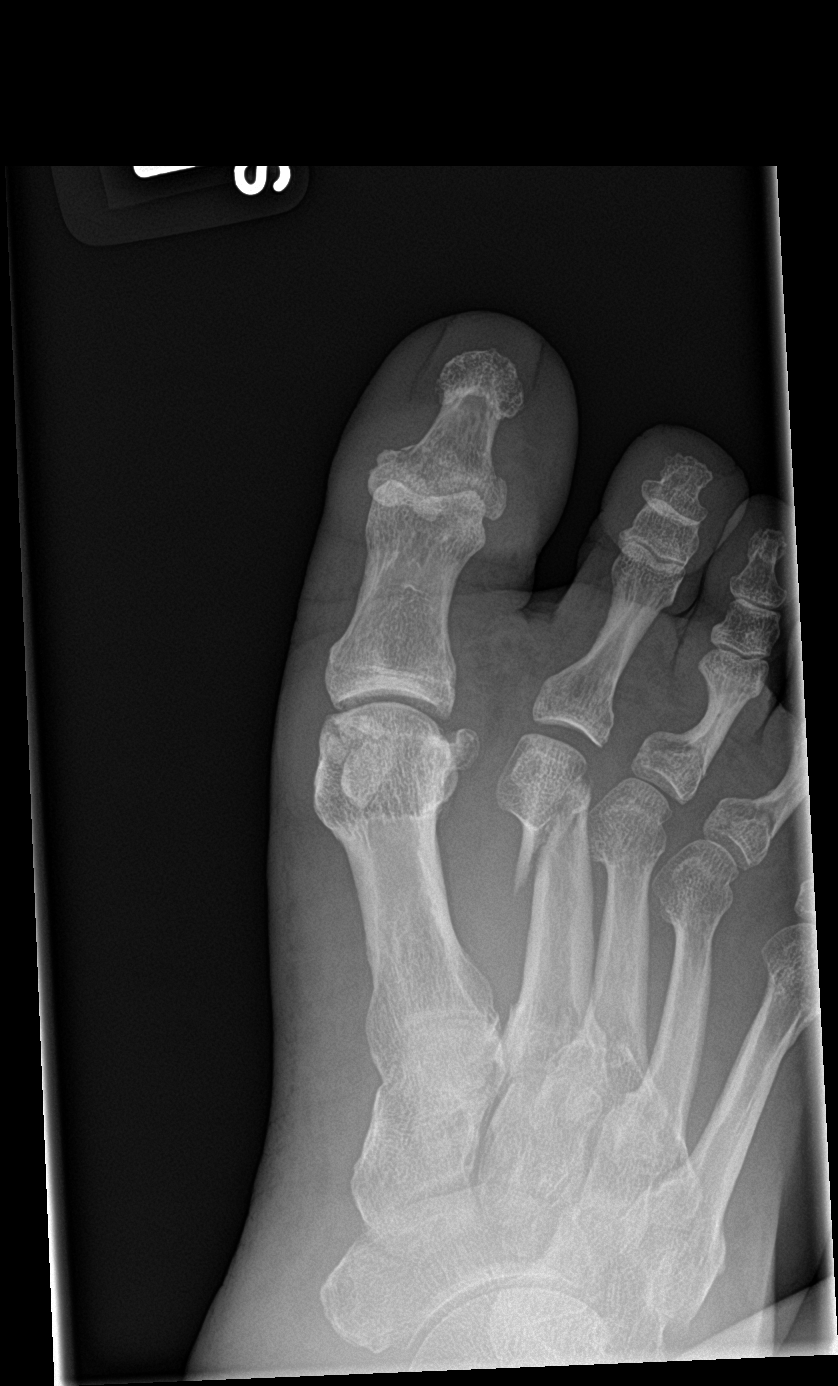

[toe obl]
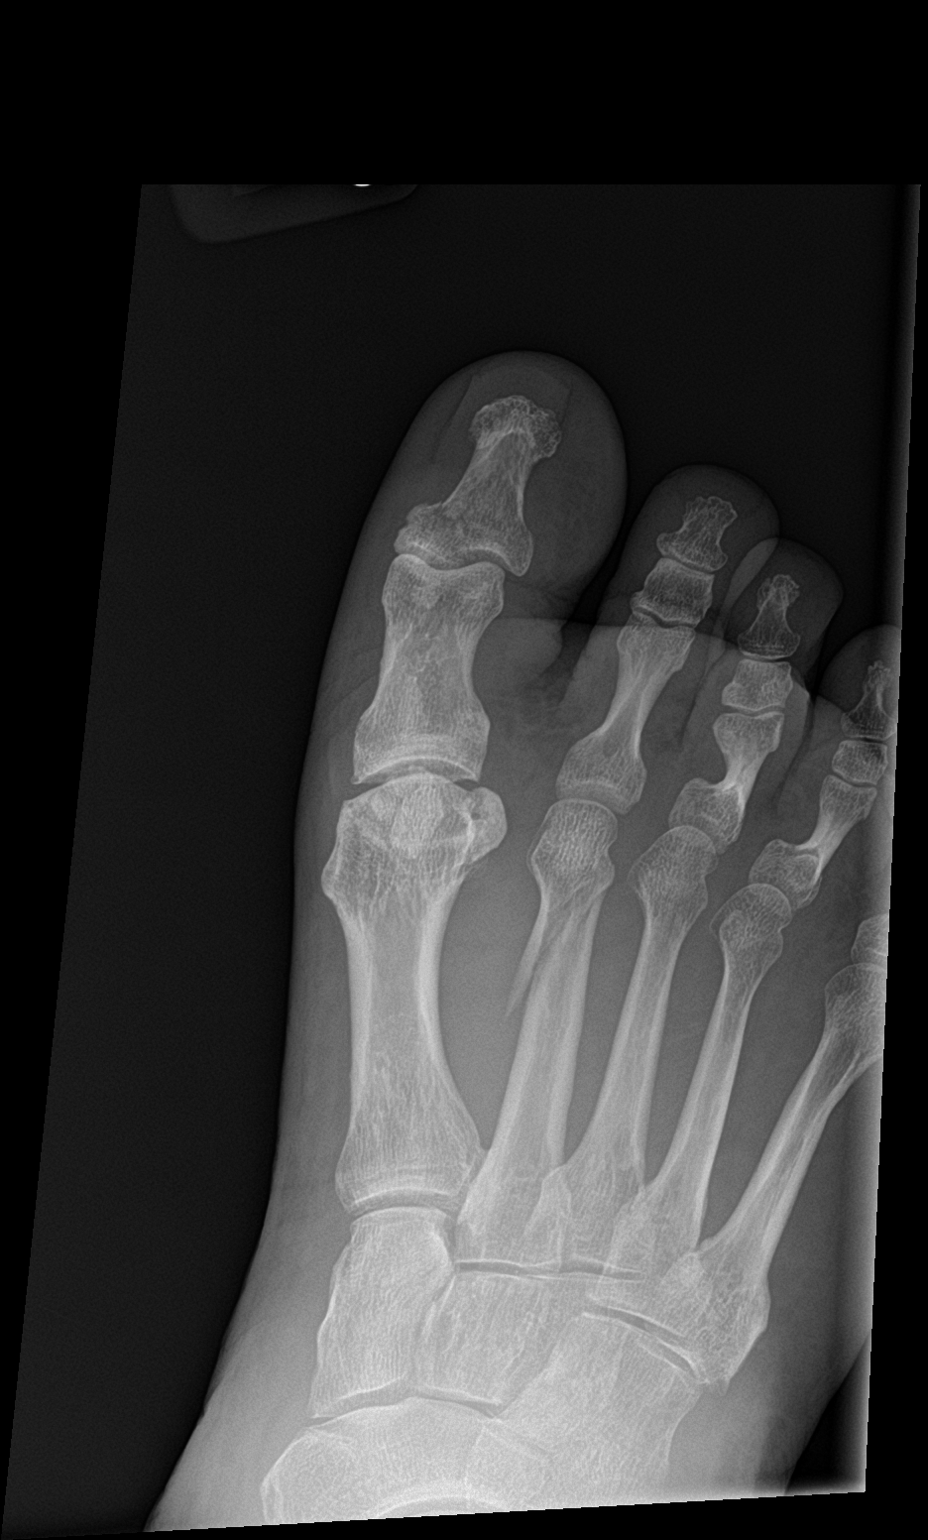

[toe lat]
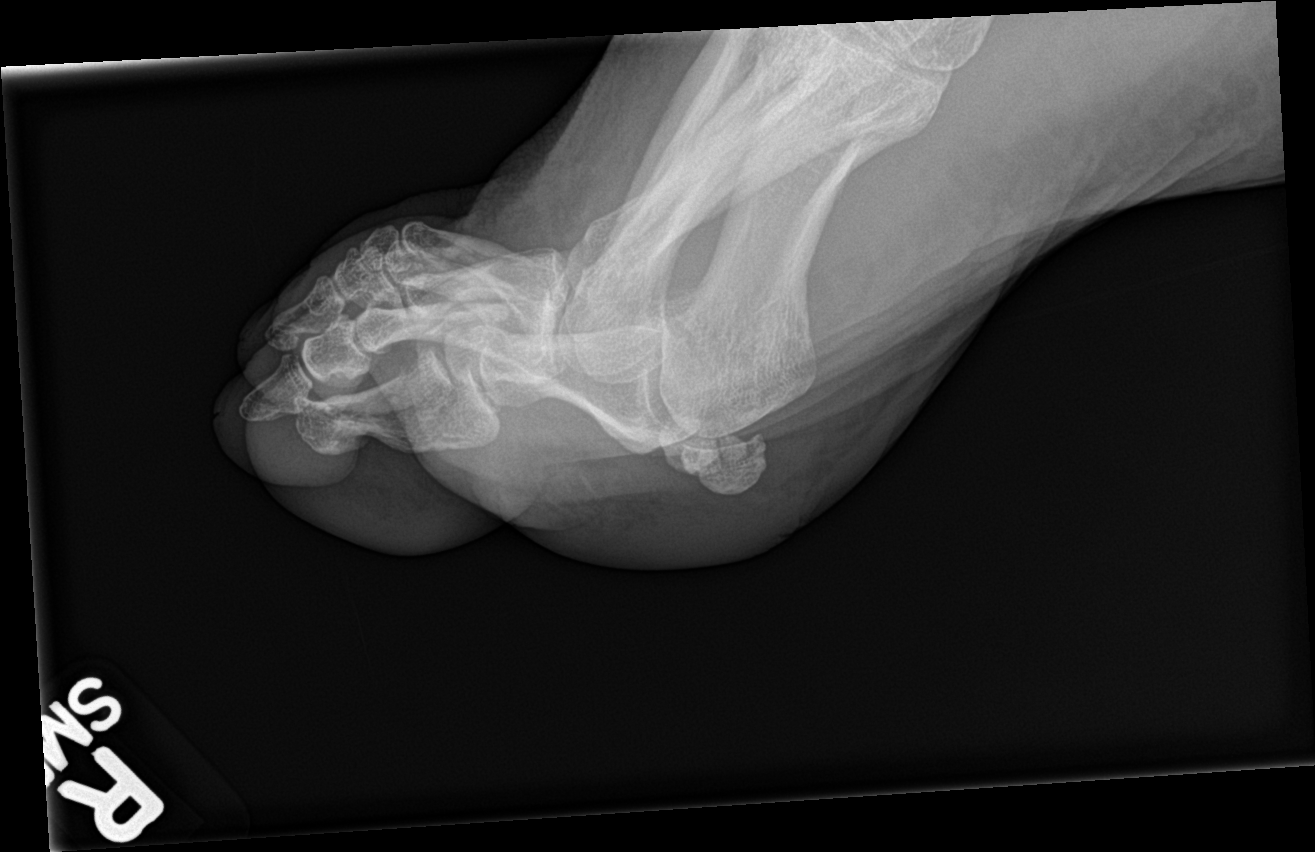

[3 of 3 positions shown; findings below may reference images not displayed]

FINDINGS: No subluxation. Acute nondisplaced intra-articular fracture
involving the base of the first distal phalanx. Acute mildly
displaced fracture involving the distal shaft of the second
metatarsal. Mild degenerative changes at the first MTP joint.
IMPRESSION: 1. Acute mildly displaced fracture involving second metatarsal
2. Acute nondisplaced intra-articular fracture involving base of
first distal phalanx

## 2021-05-07 MED ORDER — LEVETIRACETAM 500 MG PO TABS
500.0000 mg | ORAL_TABLET | Freq: Once | ORAL | Status: AC
  Administered 2021-05-07: 500 mg via ORAL
  Filled 2021-05-07: qty 1

## 2021-05-07 MED ORDER — LEVETIRACETAM 500 MG PO TABS: 500.0000 mg | ORAL_TABLET | Freq: Two times a day (BID) | ORAL | 0 refills | Status: DC

## 2021-05-07 MED ORDER — LEVETIRACETAM 500 MG PO TABS: 500.0000 mg | ORAL_TABLET | Freq: Two times a day (BID) | ORAL | 1 refills | Status: DC

## 2021-05-07 NOTE — ED Triage Notes (Signed)
Patient BIB [facility] from home. Complaint of a seizure this morning witnessed by mother. EMS states mother heard patient fall, went to check on him and was having a seizure. Pt does not have a history of seizures. Postictal upon EMS arrival to the home. EMS noted bite to tongue. VSS. NAD.

## 2021-05-07 NOTE — Discharge Instructions (Addendum)
I placed a referral to the neurology group to help arrange for follow-up.  If you do not hear from them in 2 business days, please call the number above to schedule a follow-up with them.  In the meantime you should not drive a car, swim, operate a vehicle or heavy machinery for at least 6 months, or until you are cleared to go back by a neurologist.  *  If you have a second seizure in the next 24 hours, or if you have any seizure in the future lasting more than 5 minutes, please return to the Emergency Department.  Your toe x-ray shows that you have: 1. Acute mildly displaced fracture involving second metatarsal 2. Acute nondisplaced intra-articular fracture involving base of first distal phalanx Will place in a cam walker and you will need to follow-up with your orthopedist regarding this

## 2021-05-07 NOTE — ED Notes (Signed)
Ortho at bedside applying CAM boot

## 2021-05-07 NOTE — ED Notes (Signed)
Help patient stand to get a urine sample patient is back in bed on the monitor with family at bedside

## 2021-05-07 NOTE — ED Provider Notes (Signed)
[facility] EMERGENCY DEPARTMENT Provider Note   CSN: [id number] Arrival date & time: 05/07/21  1038     History Chief Complaint  Patient presents with   Seizures    Joseph Williams is a 55 y.o. male with history of Down syndrome (highly functional), high cholesterol, presented to ED with concern for seizure.  The patient is difficult time with verbalization, but his mother whom he lives with is present at bedside to provide history.  She reports that she heard a thud today and went to the patient's room and found him face down on the floor, shaking his legs bilaterally.  He was rhythmic shaking of the legs.  He was not responding to her at the time.  This lasted "maybe a few seconds to a minute".  Afterwards the patient was very confused.  He was postictal when EMS arrived.  There was concerned that he may have bit his tongue.  Here in the ED the patient is able to verbalize, reports that he has a mild headache, denies pain anywhere else.  His mother denies that he has never had seizures before.  She denies a history of prematurity.  The patient was seen in the ED for a fall 2 weeks ago and had a CT scan of the head which was unremarkable at that time.  His mother does report that he appears to have a new rash on his upper torso, since this morning.  His mother also ports the patient had cataract surgery earlier this month.  He has been on eyedrops since the surgery.  He is not taking any other new medications.  HPI     Past Medical History:  Diagnosis Date   Down's syndrome    H/O echocardiogram 01/12/2012   Normal left ventricular wall thickness; Left ventricular systolic function is normal;  there is trace mitral and tricuspid regurgitation. EF 74%   High cholesterol    Persistent pneumonia     Patient Active Problem List   Diagnosis Date Noted   OSA (obstructive sleep apnea) 02/02/2020   Pneumonia, organism unspecified(486) 12/23/2011   Down's syndrome  11/25/2011    Past Surgical History:  Procedure Laterality Date   HERNIA REPAIR  2006   bilateal inguinal and ventral       Family History  Problem Relation Age of Onset   Heart attack Father    Breast cancer Mother        x 3   Throat cancer Maternal Grandfather     Social History   Tobacco Use   Smoking status: Never   Smokeless tobacco: Never  Substance Use Topics   Alcohol use: No   Drug use: No    Home Medications Prior to Admission medications   Medication Sig Start Date End Date Taking? Authorizing Provider  Ascorbic Acid (VITAMIN C) 500 MG CAPS Take 1 tablet by mouth daily. Take 1 tablet by mouth once daily   Yes [provider]  aspirin 81 MG tablet Take 81 mg by mouth daily.   Yes [provider]  cholecalciferol (VITAMIN D) 1000 UNITS tablet Take 1,000 Units by mouth daily.   Yes [provider]  Coenzyme Q10 300 MG CAPS Take 1 capsule by mouth daily. Take 1 capsule by mouth once daily   Yes [provider]  Cyanocobalamin (B-12) 1000 MCG CAPS Take by mouth daily.   Yes [provider]  ezetimibe (ZETIA) 10 MG tablet Take by mouth daily.   Yes  [provider]  fish oil-omega-3 fatty acids 1000 MG capsule Take 1 capsule by mouth daily. Take 1 capsule by mouth once daily   Yes [provider]  Homeopathic Products (ZINC) LOZG See admin instructions.   Yes [provider]  ketorolac (ACULAR) 0.5 % ophthalmic solution 1 drop 4 (four) times daily.   Yes [provider]  levETIRAcetam (KEPPRA) 500 MG tablet Take 1 tablet (500 mg total) by mouth 2 (two) times daily. 05/07/21 06/06/21 Yes Donnamae Muilenburg, Carola Rhine, MD  levETIRAcetam (KEPPRA) 500 MG tablet Take 1 tablet (500 mg total) by mouth 2 (two) times daily. 05/07/21 06/06/21 Yes Jaheim Canino, Carola Rhine, MD  Probiotic Product (PROBIOTIC-10 PO) Take 1 capsule by mouth daily. Take 1 capsule by mouth once daily   Yes [provider]    Allergies     Patient has no known allergies.  Review of Systems   Review of Systems  Constitutional:  Negative for chills and fever.  Respiratory:  Negative for cough and shortness of breath.   Cardiovascular:  Negative for chest pain and palpitations.  Gastrointestinal:  Negative for abdominal pain and vomiting.  Genitourinary:  Negative for dysuria and hematuria.  Musculoskeletal:  Negative for arthralgias and back pain.  Skin:  Positive for rash and wound.  Neurological:  Positive for seizures and headaches. Negative for numbness.  All other systems reviewed and are negative.  Physical Exam Updated Vital Signs BP 111/73   Pulse 77   Temp 98.5 F (36.9 C) (Oral)   Resp 15   SpO2 98%   Physical Exam Constitutional:      General: He is not in acute distress. HENT:     Head: Normocephalic and atraumatic.     Comments: Abrasion to left forehead Dried blood in mouth, no visible laceration on tongue Eyes:     Conjunctiva/sclera: Conjunctivae normal.     Pupils: Pupils are equal, round, and reactive to light.  Cardiovascular:     Rate and Rhythm: Normal rate and regular rhythm.  Pulmonary:     Effort: Pulmonary effort is normal. No respiratory distress.  Abdominal:     General: There is no distension.     Tenderness: There is no abdominal tenderness.  Musculoskeletal:     Comments: Ecchymosis and mild tenderness of the right great toe  Skin:    General: Skin is warm and dry.     Comments: Petechial rash on upper chest, nonblanching  Neurological:     General: No focal deficit present.     Mental Status: He is alert and oriented to person, place, and time. Mental status is at baseline.    ED Results / Procedures / Treatments   Labs (all labs ordered are listed, but only abnormal results are displayed) Labs Reviewed  CBC WITH DIFFERENTIAL/PLATELET - Abnormal; Notable for the following components:      Result Value   RBC 4.01 (*)    MCV 104.5 (*)    Lymphs Abs 0.5 (*)    All  other components within normal limits  COMPREHENSIVE METABOLIC PANEL - Abnormal; Notable for the following components:   Glucose, Bld 101 (*)    Calcium 8.3 (*)    Total Protein 6.4 (*)    Albumin 2.7 (*)    All other components within normal limits  URINALYSIS, ROUTINE W REFLEX MICROSCOPIC - Abnormal; Notable for the following components:   APPearance HAZY (*)    All other components within normal limits  CBG MONITORING, ED -  Abnormal; Notable for the following components:   Glucose-Capillary 106 (*)    All other components within normal limits  MAGNESIUM  ETHANOL  PROTIME-INR  RAPID URINE DRUG SCREEN, HOSP PERFORMED  TROPONIN I (HIGH SENSITIVITY)    EKG EKG Interpretation  Date/Time:  Tuesday May 07 2021 10:51:28 EDT Ventricular Rate:  75 PR Interval:  154 QRS Duration: 125 QT Interval:  399 QTC Calculation: 446 R Axis:   -78 Text Interpretation: Sinus rhythm RBBB and LAFB No sig change from Apr 27 2021 ecg Confirmed by Octaviano Glow [phone]) on 05/07/2021 11:06:39 AM  Radiology MR BRAIN WO CONTRAST  Result Date: 05/07/2021 CLINICAL DATA:  Seizure, nontraumatic EXAM: MRI HEAD WITHOUT CONTRAST TECHNIQUE: Multiplanar, multiecho pulse sequences of the brain and surrounding structures were obtained without intravenous contrast. COMPARISON:  Head CT 04/27/2021 FINDINGS: Brain: Diffusion imaging does not show any acute or subacute infarction. No focal cerebellar abnormality is seen. There appears to be chronic atrophic change of the brainstem. Cerebral hemispheres show volume loss, asymmetrically more pronounced in the temporal and parietal regions. There are mild to moderate chronic small-vessel ischemic changes of the white matter. No large vessel infarction. No mass lesion, hemorrhage, hydrocephalus or extra-axial collection. No focal temporal lobe abnormality is seen. Vascular: Major vessels at the base of the brain show flow. Skull and upper cervical spine: Negative  Sinuses/Orbits: Chronic left maxillary sinus opacification with volume loss. Orbits negative. Other: None IMPRESSION: No acute intracranial finding. Chronic volume loss of the brainstem and cerebral hemispheres. Cerebral hemispheric volume loss asymmetrically more pronounced in the temporal and parietal regions. Mild to moderate chronic small-vessel ischemic changes of the cerebral hemispheric white matter. Chronic opacification of the left maxillary sinus with volume loss. Electronically Signed   By: Nelson Chimes M.D.   On: 05/07/2021 15:50    Procedures Procedures   Medications Ordered in ED Medications  levETIRAcetam (KEPPRA) tablet 500 mg (500 mg Oral Given 05/07/21 1702)    ED Course  I have reviewed the triage vital signs and the nursing notes.  Pertinent labs & imaging results that were available during my care of the patient were reviewed by me and considered in my medical decision making (see chart for details).  The patient is here with a clinical history strongly consistent for new onset seizure.  Given that there is evidence of head trauma on exam, I ordered a repeat CT scan of the head (he had one 2 weeks ago which was unremarkable).  I have also ordered labs, platelet count, electrolyte levels.  His mother reports no infectious symptoms this week.  There does not seem to be history consistent with alcohol or benzo use or withdrawal. His mother questions whether it was related to the anesthesia, but he is now fairly far removed from the surgery.  I don't think this is causing his seizure.  Labs reviewed - Cr, platelets, hgb, LfT's, and troponin largely unremarkable.  No evidence of systemic vasculitis.  Isolated petechial rash on chest - unclear etiology - will need PCP f/u for this.  Clinical Course as of 05/08/21 0604  Tue May 07, 2021  1326 I discussed the case with Dr Lorrin Goodell from neurology who recommended MRI brain to evaluate for evidence of Alzheimer's disease, which  may predispose the patient to seizures.  We will consider antiepileptics after the MRI is completed.  I reassessed the patient and he is stable with no complaints.  I discussed the plan with the patient and his mother both in  agreement for the MRI scan. [MT]  1601 IMPRESSION: No acute intracranial finding. Chronic volume loss of the brainstem and cerebral hemispheres. Cerebral hemispheric volume loss asymmetrically more pronounced in the temporal and parietal regions.   Mild to moderate chronic small-vessel ischemic changes of the cerebral hemispheric white matter.   Chronic opacification of the left maxillary sinus with volume loss [MT]  1631 Patient does have some discoloration of his right great toe that I noticed on repeat exam.  He reports some tenderness, which is minimal when I palpate.  I wonder if this may be traumatic.  I have ordered an x-ray of the toe.  Otherwise he can follow-up with neuro as an outpatient [MT]    Clinical Course User Index [MT] Fenix Ruppe, Carola Rhine, MD    Final Clinical Impression(s) / ED Diagnoses Final diagnoses:  Seizure ([place])  Petechial rash    Rx / DC Orders ED Discharge Orders          Ordered    Ambulatory referral to Neurology       Comments: An appointment is requested in approximately: 1 week New onset seizure, Dr Lorrin Goodell referral from ED, recommending outpatient EEG and neuro f/u   05/07/21 1440    levETIRAcetam (KEPPRA) 500 MG tablet  2 times daily        05/07/21 1629    levETIRAcetam (KEPPRA) 500 MG tablet  2 times daily        05/07/21 1630             Lujean Ebright, Carola Rhine, MD 05/08/21 [phone]

## 2021-05-07 NOTE — ED Provider Notes (Signed)
Told to follow-up right great toe x-ray.  This shows a fracture of the second metatarsal and acute nondisplaced fracture of the first distal phalanx intra-articularly.  He will be placed in a cam walker.  Family would like to follow-up with their current orthopedist regarding this.   Delia Heady, PA-C 05/07/21 1838    Wyvonnia Dusky, MD 05/08/21 [phone]

## 2021-05-07 NOTE — ED Notes (Signed)
Patient transported to MRI 

## 2021-05-07 NOTE — Progress Notes (Signed)
Orthopedic Tech Progress Note Patient Details:  Joseph Williams 1966-03-21 [id number]  Ortho Devices Type of Ortho Device: CAM walker Ortho Device/Splint Location: RLE Ortho Device/Splint Interventions: Ordered, Application   Post Interventions Patient Tolerated: Well Instructions Provided: Care of [place] 05/07/2021, 7:14 PM

## 2021-05-13 ENCOUNTER — Telehealth: Payer: Self-pay | Admitting: Neurology

## 2021-05-13 ENCOUNTER — Encounter: Payer: Self-pay | Admitting: Diagnostic Neuroimaging

## 2021-05-13 ENCOUNTER — Ambulatory Visit: Payer: Medicare Other | Admitting: Diagnostic Neuroimaging

## 2021-05-13 VITALS — BP 133/65 | HR 81 | Ht 60.0 in | Wt 220.0 lb

## 2021-05-13 DIAGNOSIS — Q909 Down syndrome, unspecified: Secondary | ICD-10-CM | POA: Diagnosis not present

## 2021-05-13 DIAGNOSIS — G40909 Epilepsy, unspecified, not intractable, without status epilepticus: Secondary | ICD-10-CM

## 2021-05-13 MED ORDER — LEVETIRACETAM 250 MG PO TABS: 250.0000 mg | ORAL_TABLET | Freq: Two times a day (BID) | ORAL | 12 refills | Status: DC

## 2021-05-13 NOTE — Telephone Encounter (Signed)
error 

## 2021-05-13 NOTE — Patient Instructions (Signed)
  NEW ONSET SEIZURE DISORDER (down syndrome, mild memory issues, post-concussion) - reduce levetiracetam '250mg'$  twice a day (possible side effects) - monitor for improvement over next 1-2 weeks

## 2021-05-13 NOTE — Progress Notes (Signed)
[facility]  PATIENT: Joseph Williams DOB: August 15, 1966  REFERRING CLINICIAN: Wyvonnia Dusky, MD HISTORY FROM: patient  REASON FOR VISIT: new consult    HISTORICAL  CHIEF COMPLAINT:  Chief Complaint  Patient presents with   Seizures    Rm 6 with mother Joseph Williams Pt is well, mother states he has had a seizure Aug 2, that was the only one witness that she knows of, maybe lasted about a min.     HISTORY OF PRESENT ILLNESS:   55 year old male with Down syndrome here for evaluation of seizures.  04/27/2021 patient was in another room when he collapsed.  Mother came to his aid and his face was blue.  Patient was taken the hospital and evaluated for syncope.  05/07/2021 patient had another event this time witnessed collapse and seizure activity.  Convulsions lasted about 1 minute and he was unresponsive for 4 minutes afterwards.  Patient was taken to the hospital for evaluation and he was started on antiseizure medication.  Since that time patient continues to have malaise and fatigue more than at baseline.  Mother concerned that this may be a medication side effect.  Patient has had some mild memory problems that have progressed over the past few months.  At baseline he has limited verbal output mainly answering yes and no questions.  He does follow commands.  Otherwise he does stay fairly active at home at baseline.   REVIEW OF SYSTEMS: Full 14 system review of systems performed and negative with exception of: as per HPI.  ALLERGIES: No Known Allergies  HOME MEDICATIONS: Outpatient Medications Prior to Visit  Medication Sig Dispense Refill   Ascorbic Acid (VITAMIN C) 500 MG CAPS Take 1 tablet by mouth daily. Take 1 tablet by mouth once daily     aspirin 81 MG tablet Take 81 mg by mouth daily.     cholecalciferol (VITAMIN D) 1000 UNITS tablet Take 1,000 Units by mouth daily.     Coenzyme Q10 300 MG CAPS Take 1 capsule by mouth daily. Take 1 capsule by mouth once  daily     Cyanocobalamin (B-12) 1000 MCG CAPS Take by mouth daily.     DUREZOL 0.05 % EMUL Apply 1 drop to eye 4 (four) times daily.     ezetimibe (ZETIA) 10 MG tablet Take by mouth daily.     fish oil-omega-3 fatty acids 1000 MG capsule Take 1 capsule by mouth daily. Take 1 capsule by mouth once daily     Homeopathic Products (ZINC) LOZG See admin instructions.     ketorolac (ACULAR) 0.5 % ophthalmic solution 1 drop 4 (four) times daily.     Multiple Minerals (CALCIUM-MAGNESIUM-ZINC) TABS Take by mouth.     Probiotic Product (PROBIOTIC-10 PO) Take 1 capsule by mouth daily. Take 1 capsule by mouth once daily     levETIRAcetam (KEPPRA) 500 MG tablet Take 1 tablet (500 mg total) by mouth 2 (two) times daily. 60 tablet 1   levETIRAcetam (KEPPRA) 500 MG tablet Take 1 tablet (500 mg total) by mouth 2 (two) times daily. 60 tablet 0   No facility-administered medications prior to visit.    PAST MEDICAL HISTORY: Past Medical History:  Diagnosis Date   Down's syndrome    H/O echocardiogram 01/12/2012   Normal left ventricular wall thickness; Left ventricular systolic function is normal;  there is trace mitral and tricuspid regurgitation. EF 74%   High cholesterol    Persistent pneumonia     PAST SURGICAL HISTORY: Past  Surgical History:  Procedure Laterality Date   EYE SURGERY  04/2005   HERNIA REPAIR  10/06/2004   bilateal inguinal and ventral    FAMILY HISTORY: Family History  Problem Relation Age of Onset   Heart attack Father    Breast cancer Mother        x 3   Throat cancer Maternal Grandfather     SOCIAL HISTORY: Social History   Socioeconomic History   Marital status: Single    Spouse name: Not on file   Number of children: Not on file   Years of education: Not on file   Highest education level: Not on file  Occupational History   Not on file  Tobacco Use   Smoking status: Never   Smokeless tobacco: Never  Substance and Sexual Activity   Alcohol use: No   Drug  use: No   Sexual activity: Not on file  Other Topics Concern   Not on file  Social History Narrative   Not on file   Social Determinants of Health   Financial Resource Strain: Not on file  Food Insecurity: Not on file  Transportation Needs: Not on file  Physical Activity: Not on file  Stress: Not on file  Social Connections: Not on file  Intimate Partner Violence: Not on file     PHYSICAL EXAM  GENERAL EXAM/CONSTITUTIONAL: Vitals:  Vitals:   05/13/21 1018  BP: 133/65  Pulse: 81  Weight: 220 lb (99.8 kg)  Height: 5' (1.524 m)   Body mass index is 42.97 kg/m. Wt Readings from Last 3 Encounters:  05/13/21 220 lb (99.8 kg)  02/02/20 228 lb (103.4 kg)  09/18/18 225 lb (102.1 kg)   Patient is in no distress; well developed, nourished and groomed; neck is supple  CARDIOVASCULAR: Examination of carotid arteries is normal; no carotid bruits Regular rate and rhythm, no murmurs Examination of peripheral vascular system by observation and palpation is normal  EYES: Ophthalmoscopic exam of optic discs and posterior segments is normal; no papilledema or hemorrhages No results found.  MUSCULOSKELETAL: Gait, strength, tone, movements noted in Neurologic exam below  NEUROLOGIC: MENTAL STATUS:  No flowsheet data found. SLEEPY YES AND NO ANSWERS FOLLOWS SIMPLE COMMANDS  CRANIAL NERVE:  2nd - no papilledema on fundoscopic exam 2nd, 3rd, 4th, 6th - pupils equal and reactive to light, visual fields full to confrontation, extraocular muscles intact, no nystagmus 5th - facial sensation symmetric 7th - facial strength symmetric 8th - hearing intact 9th - palate elevates symmetrically, uvula midline 11th - shoulder shrug symmetric 12th - tongue protrusion midline  MOTOR:  normal bulk and tone, full strength in the BUE, BLE; slow motor responses  SENSORY:  normal and symmetric to light touch  COORDINATION:  finger-nose-finger, fine finger movements normal  REFLEXES:   deep tendon reflexes TRACE  and symmetric  GAIT/STATION:  narrow based gait     DIAGNOSTIC DATA (LABS, IMAGING, TESTING) - I reviewed patient records, labs, notes, testing and imaging myself where available.  Lab Results  Component Value Date   WBC 4.6 05/07/2021   HGB 13.6 05/07/2021   HCT 41.9 05/07/2021   MCV 104.5 (H) 05/07/2021   PLT 266 05/07/2021      Component Value Date/Time   NA 138 05/07/2021 1059   K 3.8 05/07/2021 1059   CL 106 05/07/2021 1059   CO2 24 05/07/2021 1059   GLUCOSE 101 (H) 05/07/2021 1059   BUN 12 05/07/2021 1059   CREATININE 0.91 05/07/2021 1059  CREATININE 1.04 12/11/2011 1530   CALCIUM 8.3 (L) 05/07/2021 1059   PROT 6.4 (L) 05/07/2021 1059   ALBUMIN 2.7 (L) 05/07/2021 1059   AST 24 05/07/2021 1059   ALT 19 05/07/2021 1059   ALKPHOS 89 05/07/2021 1059   BILITOT 1.0 05/07/2021 1059   GFRNONAA >60 05/07/2021 1059   Lab Results  Component Value Date   CHOL 192 12/11/2011   HDL 39 (L) 12/11/2011   LDLCALC 127 (H) 12/11/2011   TRIG 128 12/11/2011   CHOLHDL 4.9 12/11/2011   No results found for: HGBA1C No results found for: VITAMINB12 Lab Results  Component Value Date   TSH 3.14 12/23/2011    05/07/21 MRI brain [I reviewed images myself and agree with interpretation. -VRP]  - No acute intracranial finding. Chronic volume loss of the brainstem and cerebral hemispheres. Cerebral hemispheric volume loss asymmetrically more pronounced in the temporal and parietal regions. - Mild to moderate chronic small-vessel ischemic changes of the cerebral hemispheric white matter. - Chronic opacification of the left maxillary sinus with volume loss.   ASSESSMENT AND PLAN  55 y.o. year old male here with:  Dx:  1. Down's syndrome   2. Seizure disorder ([place])     PLAN:  NEW ONSET SEIZURE DISORDER (down syndrome, mild memory issues, post-concussion) - reduce levetiracetam '250mg'$  twice a day (possible side effects) - monitor for improvement  over next 1-2 weeks - may consider EEG In future if not improving  Meds ordered this encounter  Medications   levETIRAcetam (KEPPRA) 250 MG tablet    Sig: Take 1 tablet (250 mg total) by mouth 2 (two) times daily.    Dispense:  60 tablet    Refill:  12   Return in about 3 months (around 08/13/2021).    Penni Bombard, MD [id number], [id number] AM Certified in Neurology, Neurophysiology and Neuroimaging  [facility] [address], [place], [place] [address] [phone]

## 2021-05-22 ENCOUNTER — Ambulatory Visit: Payer: Self-pay | Admitting: Neurology

## 2021-05-28 DIAGNOSIS — M79671 Pain in right foot: Secondary | ICD-10-CM | POA: Diagnosis not present

## 2021-05-28 DIAGNOSIS — M25561 Pain in right knee: Secondary | ICD-10-CM | POA: Diagnosis not present

## 2021-05-28 MED ORDER — LAMOTRIGINE 25 MG PO TABS: ORAL_TABLET | ORAL | 3 refills | Status: DC

## 2021-05-28 NOTE — Telephone Encounter (Signed)
Start lamotrigine titration dose.  Reduce levetiracetam to '250mg'$  daily x 1 week and then stop.   Meds ordered this encounter  Medications   lamoTRIgine (LAMICTAL) 25 MG tablet    Sig: Take '25mg'$  daily for 2 weeks; then take '25mg'$  twice a day for 2 weeks; then take '50mg'$  twice a day for 2 weeks; then take '75mg'$  twice a day for 2 weeks; then '100mg'$  twice a day    Dispense:  240 tablet    Refill:  Ashley, MD [id number], [id number] AM Certified in Neurology, Neurophysiology and [facility] [address] [place], [place] [address] [phone]

## 2021-05-30 DIAGNOSIS — G4733 Obstructive sleep apnea (adult) (pediatric): Secondary | ICD-10-CM | POA: Diagnosis not present

## 2021-06-04 DIAGNOSIS — S92321D Displaced fracture of second metatarsal bone, right foot, subsequent encounter for fracture with routine healing: Secondary | ICD-10-CM | POA: Diagnosis not present

## 2021-06-04 DIAGNOSIS — R569 Unspecified convulsions: Secondary | ICD-10-CM | POA: Diagnosis not present

## 2021-06-04 DIAGNOSIS — R052 Subacute cough: Secondary | ICD-10-CM | POA: Diagnosis not present

## 2021-06-04 DIAGNOSIS — Z09 Encounter for follow-up examination after completed treatment for conditions other than malignant neoplasm: Secondary | ICD-10-CM | POA: Diagnosis not present

## 2021-06-04 DIAGNOSIS — R0989 Other specified symptoms and signs involving the circulatory and respiratory systems: Secondary | ICD-10-CM | POA: Diagnosis not present

## 2021-06-05 DIAGNOSIS — Z09 Encounter for follow-up examination after completed treatment for conditions other than malignant neoplasm: Secondary | ICD-10-CM | POA: Diagnosis not present

## 2021-06-05 DIAGNOSIS — R0989 Other specified symptoms and signs involving the circulatory and respiratory systems: Secondary | ICD-10-CM | POA: Diagnosis not present

## 2021-06-05 DIAGNOSIS — R052 Subacute cough: Secondary | ICD-10-CM | POA: Diagnosis not present

## 2021-06-06 DIAGNOSIS — I82441 Acute embolism and thrombosis of right tibial vein: Secondary | ICD-10-CM | POA: Diagnosis not present

## 2021-06-06 DIAGNOSIS — L819 Disorder of pigmentation, unspecified: Secondary | ICD-10-CM | POA: Diagnosis not present

## 2021-06-06 DIAGNOSIS — I82401 Acute embolism and thrombosis of unspecified deep veins of right lower extremity: Secondary | ICD-10-CM | POA: Diagnosis not present

## 2021-06-06 DIAGNOSIS — I82411 Acute embolism and thrombosis of right femoral vein: Secondary | ICD-10-CM | POA: Diagnosis not present

## 2021-06-06 DIAGNOSIS — M7989 Other specified soft tissue disorders: Secondary | ICD-10-CM | POA: Diagnosis not present

## 2021-06-09 ENCOUNTER — Other Ambulatory Visit: Payer: Self-pay

## 2021-06-09 ENCOUNTER — Emergency Department (HOSPITAL_COMMUNITY): Payer: Medicare Other

## 2021-06-09 ENCOUNTER — Observation Stay (HOSPITAL_COMMUNITY): Payer: Medicare Other

## 2021-06-09 ENCOUNTER — Observation Stay (HOSPITAL_COMMUNITY)
Admission: EM | Admit: 2021-06-09 | Discharge: 2021-06-11 | Disposition: A | Discharge status: Home / Self Care | Payer: Medicare Other | Attending: Internal Medicine | Admitting: Internal Medicine

## 2021-06-09 ENCOUNTER — Encounter (HOSPITAL_COMMUNITY): Payer: Self-pay | Admitting: Internal Medicine

## 2021-06-09 DIAGNOSIS — U071 COVID-19: Secondary | ICD-10-CM | POA: Diagnosis not present

## 2021-06-09 DIAGNOSIS — Y92002 Bathroom of unspecified non-institutional (private) residence single-family (private) house as the place of occurrence of the external cause: Secondary | ICD-10-CM | POA: Diagnosis not present

## 2021-06-09 DIAGNOSIS — G319 Degenerative disease of nervous system, unspecified: Secondary | ICD-10-CM | POA: Diagnosis not present

## 2021-06-09 DIAGNOSIS — Z86718 Personal history of other venous thrombosis and embolism: Secondary | ICD-10-CM

## 2021-06-09 DIAGNOSIS — G4733 Obstructive sleep apnea (adult) (pediatric): Secondary | ICD-10-CM | POA: Diagnosis not present

## 2021-06-09 DIAGNOSIS — J9811 Atelectasis: Secondary | ICD-10-CM | POA: Diagnosis not present

## 2021-06-09 DIAGNOSIS — J019 Acute sinusitis, unspecified: Secondary | ICD-10-CM | POA: Diagnosis not present

## 2021-06-09 DIAGNOSIS — Z7982 Long term (current) use of aspirin: Secondary | ICD-10-CM | POA: Insufficient documentation

## 2021-06-09 DIAGNOSIS — Q909 Down syndrome, unspecified: Secondary | ICD-10-CM

## 2021-06-09 DIAGNOSIS — R6889 Other general symptoms and signs: Secondary | ICD-10-CM | POA: Diagnosis not present

## 2021-06-09 DIAGNOSIS — Z743 Need for continuous supervision: Secondary | ICD-10-CM | POA: Diagnosis not present

## 2021-06-09 DIAGNOSIS — H7190 Unspecified cholesteatoma, unspecified ear: Secondary | ICD-10-CM | POA: Diagnosis not present

## 2021-06-09 DIAGNOSIS — R059 Cough, unspecified: Secondary | ICD-10-CM

## 2021-06-09 DIAGNOSIS — Z79899 Other long term (current) drug therapy: Secondary | ICD-10-CM | POA: Diagnosis not present

## 2021-06-09 DIAGNOSIS — J352 Hypertrophy of adenoids: Secondary | ICD-10-CM | POA: Diagnosis not present

## 2021-06-09 DIAGNOSIS — Z7901 Long term (current) use of anticoagulants: Secondary | ICD-10-CM | POA: Insufficient documentation

## 2021-06-09 DIAGNOSIS — R9431 Abnormal electrocardiogram [ECG] [EKG]: Secondary | ICD-10-CM | POA: Diagnosis not present

## 2021-06-09 DIAGNOSIS — W19XXXA Unspecified fall, initial encounter: Secondary | ICD-10-CM | POA: Insufficient documentation

## 2021-06-09 DIAGNOSIS — R0902 Hypoxemia: Secondary | ICD-10-CM | POA: Diagnosis not present

## 2021-06-09 DIAGNOSIS — S199XXA Unspecified injury of neck, initial encounter: Secondary | ICD-10-CM | POA: Diagnosis not present

## 2021-06-09 DIAGNOSIS — J398 Other specified diseases of upper respiratory tract: Secondary | ICD-10-CM | POA: Diagnosis not present

## 2021-06-09 DIAGNOSIS — R600 Localized edema: Secondary | ICD-10-CM | POA: Diagnosis not present

## 2021-06-09 DIAGNOSIS — S0083XA Contusion of other part of head, initial encounter: Secondary | ICD-10-CM | POA: Diagnosis not present

## 2021-06-09 DIAGNOSIS — R569 Unspecified convulsions: Secondary | ICD-10-CM | POA: Diagnosis not present

## 2021-06-09 DIAGNOSIS — J189 Pneumonia, unspecified organism: Secondary | ICD-10-CM

## 2021-06-09 DIAGNOSIS — S0990XA Unspecified injury of head, initial encounter: Secondary | ICD-10-CM | POA: Diagnosis not present

## 2021-06-09 DIAGNOSIS — R58 Hemorrhage, not elsewhere classified: Secondary | ICD-10-CM | POA: Diagnosis not present

## 2021-06-09 DIAGNOSIS — I517 Cardiomegaly: Secondary | ICD-10-CM | POA: Diagnosis not present

## 2021-06-09 DIAGNOSIS — J320 Chronic maxillary sinusitis: Secondary | ICD-10-CM | POA: Diagnosis not present

## 2021-06-09 LAB — CBC WITH DIFFERENTIAL/PLATELET
Abs Immature Granulocytes: 0.02 10*3/uL (ref 0.00–0.07)
Basophils Absolute: 0.1 10*3/uL (ref 0.0–0.1)
Basophils Relative: 1 %
Eosinophils Absolute: 0 10*3/uL (ref 0.0–0.5)
Eosinophils Relative: 1 %
HCT: 40.2 % (ref 39.0–52.0)
Hemoglobin: 13.3 g/dL (ref 13.0–17.0)
Immature Granulocytes: 1 %
Lymphocytes Relative: 15 %
Lymphs Abs: 0.6 10*3/uL — ABNORMAL LOW (ref 0.7–4.0)
MCH: 34.2 pg — ABNORMAL HIGH (ref 26.0–34.0)
MCHC: 33.1 g/dL (ref 30.0–36.0)
MCV: 103.3 fL — ABNORMAL HIGH (ref 80.0–100.0)
Monocytes Absolute: 0.7 10*3/uL (ref 0.1–1.0)
Monocytes Relative: 15 %
Neutro Abs: 2.8 10*3/uL (ref 1.7–7.7)
Neutrophils Relative %: 67 %
Platelets: 221 10*3/uL (ref 150–400)
RBC: 3.89 MIL/uL — ABNORMAL LOW (ref 4.22–5.81)
RDW: 13.9 % (ref 11.5–15.5)
WBC: 4.2 10*3/uL (ref 4.0–10.5)
nRBC: 0 % (ref 0.0–0.2)

## 2021-06-09 LAB — COMPREHENSIVE METABOLIC PANEL
ALT: 18 U/L (ref 0–44)
AST: 25 U/L (ref 15–41)
Albumin: 2.5 g/dL — ABNORMAL LOW (ref 3.5–5.0)
Alkaline Phosphatase: 78 U/L (ref 38–126)
Anion gap: 6 (ref 5–15)
BUN: 9 mg/dL (ref 6–20)
CO2: 26 mmol/L (ref 22–32)
Calcium: 8.3 mg/dL — ABNORMAL LOW (ref 8.9–10.3)
Chloride: 107 mmol/L (ref 98–111)
Creatinine, Ser: 0.98 mg/dL (ref 0.61–1.24)
GFR, Estimated: >60 mL/min (ref >60)
Glucose, Bld: 100 mg/dL — ABNORMAL HIGH (ref 70–99)
Potassium: 3.7 mmol/L (ref 3.5–5.1)
Sodium: 139 mmol/L (ref 135–145)
Total Bilirubin: 0.4 mg/dL (ref 0.3–1.2)
Total Protein: 6.4 g/dL — ABNORMAL LOW (ref 6.5–8.1)

## 2021-06-09 LAB — TSH: TSH: 3.119 u[IU]/mL (ref 0.350–4.500)

## 2021-06-09 LAB — CBG MONITORING, ED: Glucose-Capillary: 92 mg/dL (ref 70–99)

## 2021-06-09 LAB — VITAMIN B12: Vitamin B-12: 300 pg/mL (ref 180–914)

## 2021-06-09 IMAGING — MR MR HEAD W/O CM
13 of 14 series · 45 of 48 positions shown · non-contrast
Comparison: Head CT [DATE].

CLINICAL DATA: Seizure, abnormal neuro exam. Additional history
provided: Seizure, fall (hitting back of head), small hematoma
noted. Patient on Xarelto. History of down syndrome.

EXAM:
MRI HEAD WITHOUT CONTRAST
TECHNIQUE: Multiplanar, multiecho pulse sequences of the brain and surrounding
structures were obtained without intravenous contrast.

[Series 5: DWI · axial · 3.0mm · 0.88mm/px · z∈[-51,+100]mm · 7 of 104 slices shown (1 of 4)]
[im 1/104]
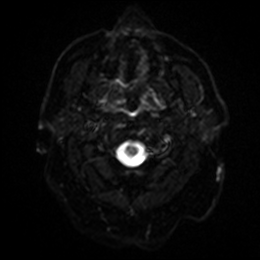
[im 18/104]
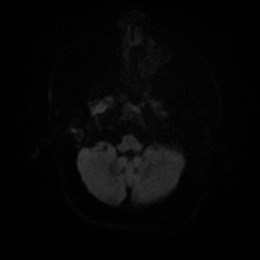
[im 35/104]
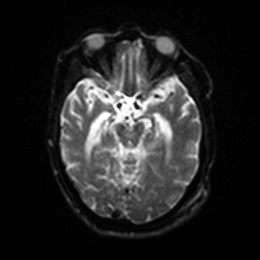
[im 52/104]
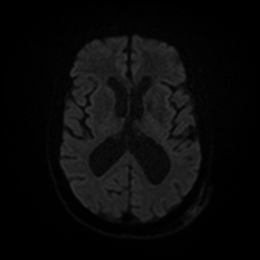
[im 69/104]
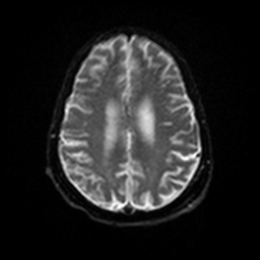
[im 86/104]
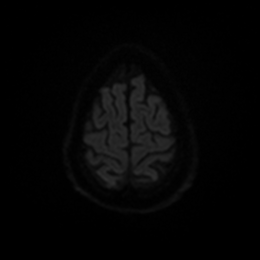
[im 104/104]
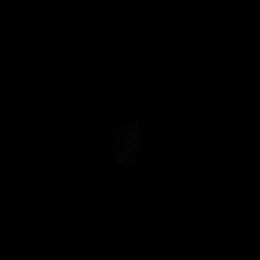

[Series 6: DWI · axial · 3.0mm · 0.88mm/px · z∈[-51,+100]mm · 4 of 52 slices shown (2 of 4)]
[im 1/52]
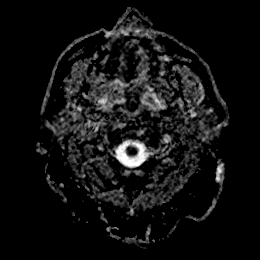
[im 18/52]
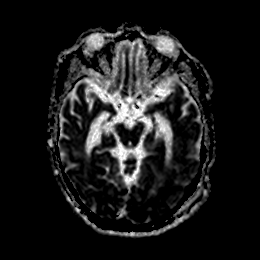
[im 35/52]
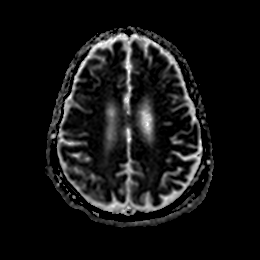
[im 52/52]
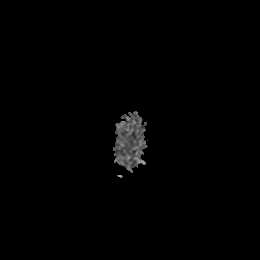

[Series 7: DWI · coronal · 4.0mm · 0.88mm/px · 3 of 64 slices shown (3 of 4)]
[im 1/64]
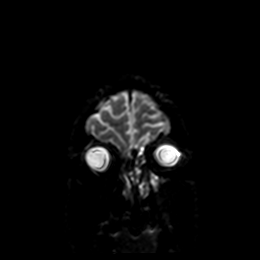
[im 32/64]
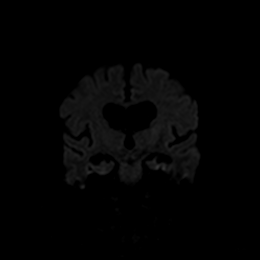
[im 64/64]
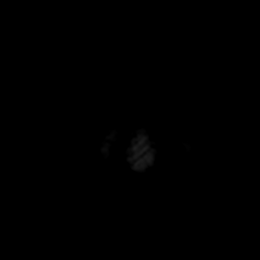

[Series 8: DWI · coronal · 4.0mm · 0.88mm/px · 2 of 32 slices shown (4 of 4)]
[im 1/32]
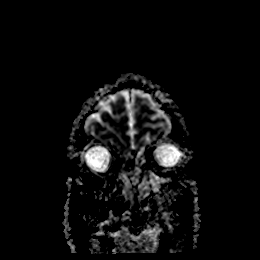
[im 32/32]
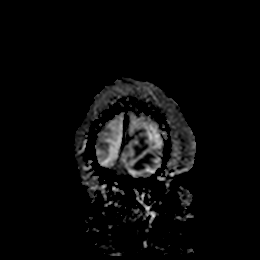

[Series 9: T1 · sagittal · 5.0mm · 0.75mm/px · 1 of 23 slices shown]
[im 1/23]
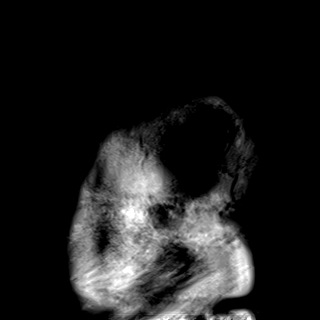

[Series 10: T2 · axial · 5.0mm · 0.72mm/px · 1 of 26 slices shown]
[im 1/26]
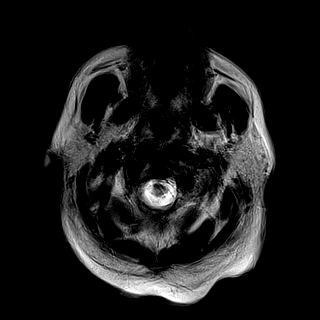

[Series 11: FLAIR · axial · 5.0mm · 0.90mm/px · 1 of 25 slices shown]
[im 1/25]
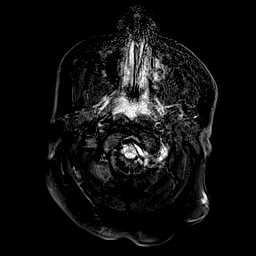

[Series 12: mag_images · axial · 3.0mm · 0.90mm/px · z∈[-66,+108]mm · 3 of 60 slices shown]
[im 1/60]
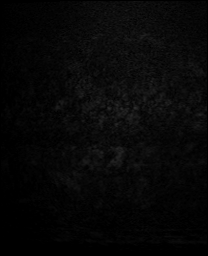
[im 30/60]
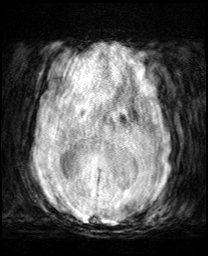
[im 60/60]
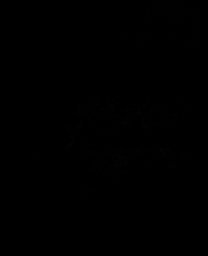

[Series 13: pha_images · axial · 3.0mm · 0.90mm/px · z∈[-54,+93]mm · 2 of 40 slices shown]
[im 1/40]
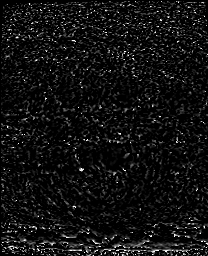
[im 40/40]
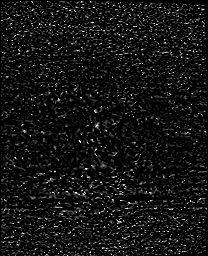

[Series 14: swi_images · axial · 3.0mm · 0.90mm/px · z∈[-66,+108]mm · 3 of 60 slices shown]
[im 1/60]
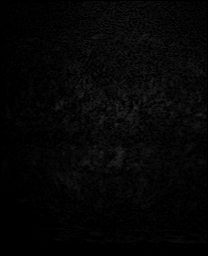
[im 30/60]
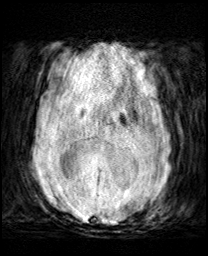
[im 60/60]
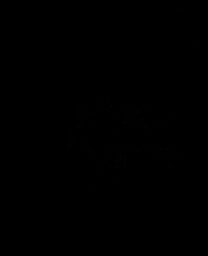

[Series 15: mip_images(sw) · axial · 24.0mm · 0.90mm/px · z∈[-56,+98]mm · 3 of 53 slices shown]
[im 1/53]
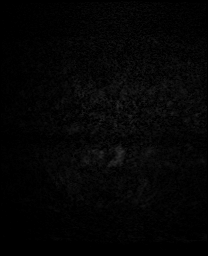
[im 27/53]
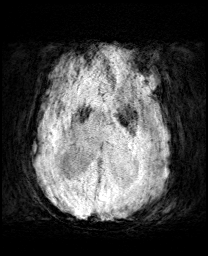
[im 53/53]
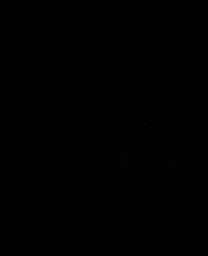

[Series 17: t1_mprage_tra_p2_iso · axial · 1.0mm · 0.98mm/px · z∈[-104,+70]mm · 9 of 176 slices shown]
[im 1/176]
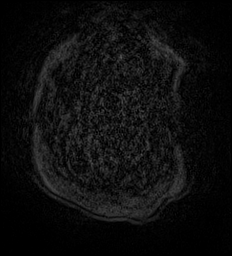
[im 22/176]
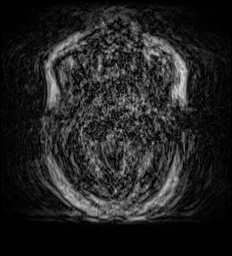
[im 44/176]
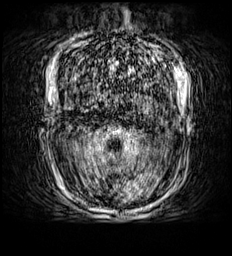
[im 66/176]
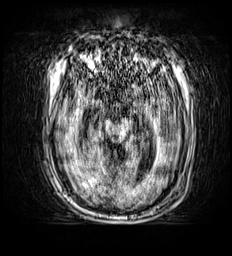
[im 88/176]
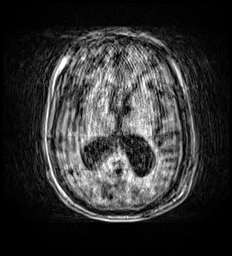
[im 110/176]
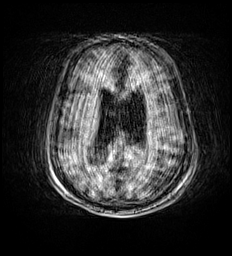
[im 132/176]
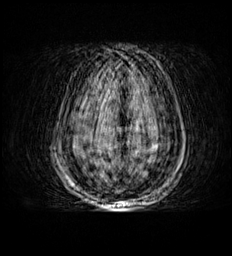
[im 154/176]
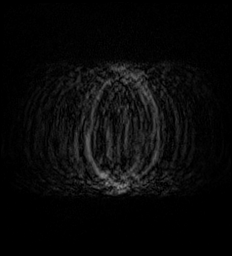
[im 176/176]
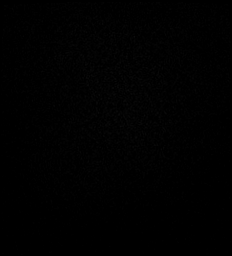

[Series 18: t1_mprage_tra_p2_iso_mpr_coronal · coronal · 1.0mm · 0.45mm/px · 6 of 120 slices shown]
[im 1/120]
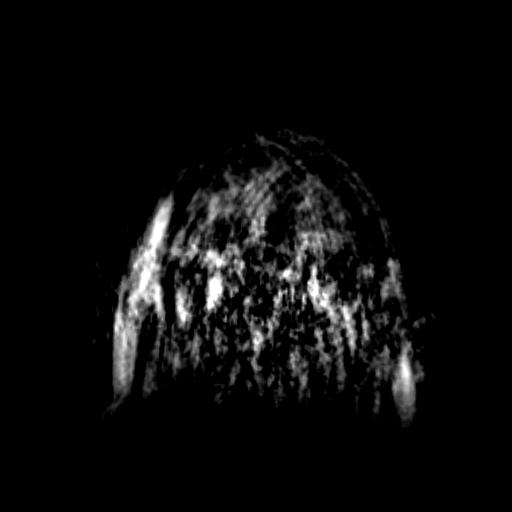
[im 24/120]
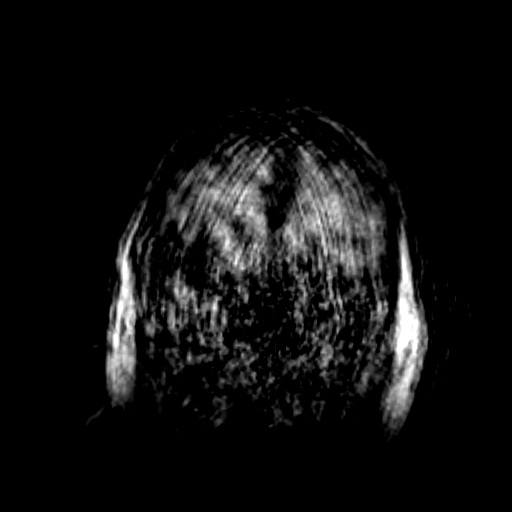
[im 48/120]
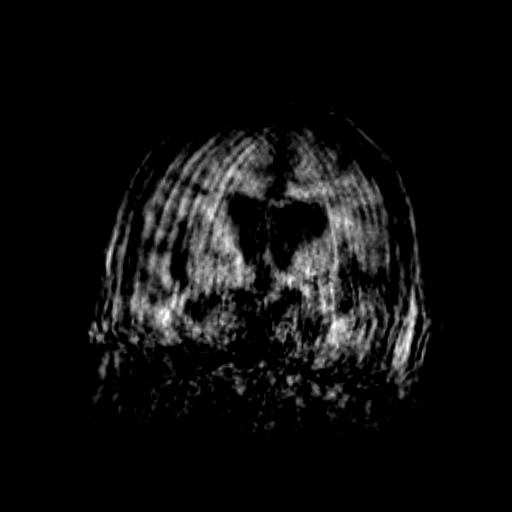
[im 72/120]
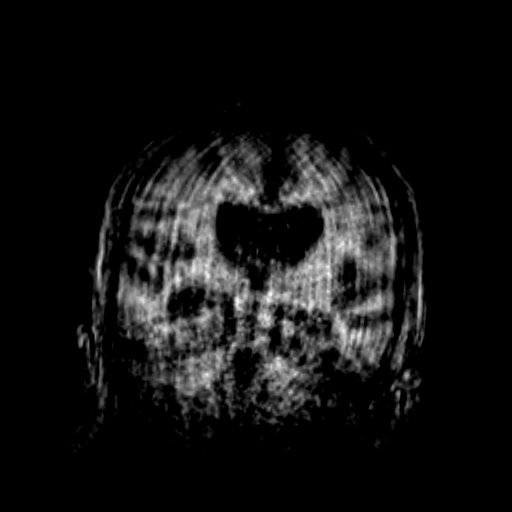
[im 96/120]
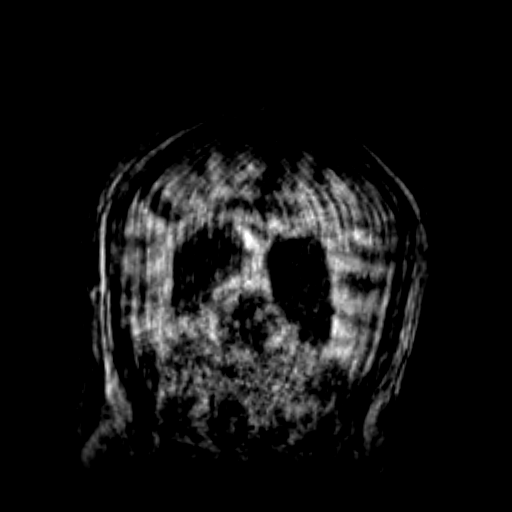
[im 120/120]
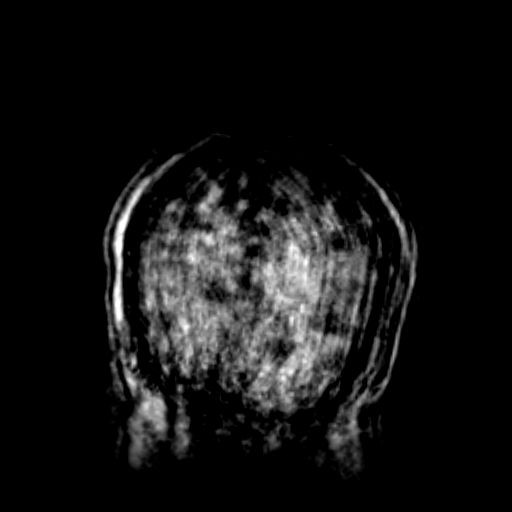

[45 of 48 positions shown; findings below may reference images not displayed]

FINDINGS: Brain:

Multiple sequences are significantly motion degraded, limiting
evaluation. Most notably, there is severe motion degradation of the
sagittal T1 weighted sequence, severe motion degradation of the
axial T2 TSE sequence, severe motion degradation of the axial
T2/FLAIR sequence, severe motion degradation of the axial SWI
sequence, severe motion degradation of the axial T1 weighted
sequences and severe motion degradation of the coronal T1 weighted
sequence.

Redemonstrated age advanced supratentorial and infratentorial
atrophy.

The diffusion-weighted imaging is of good quality. No evidence of
acute infarction.

Motion degradation severely limits evaluation for focal parenchymal
signal abnormalities on the remaining sequences. Mild-to-moderate
chronic small-vessel ischemic changes within the cerebral white
matter were better appreciated on the prior brain MRI of [DATE].
Within the limitations of motion degradation, no intracranial mass,
chronic intracranial blood products or extra-axial fluid collection
is identified. No midline shift.

Vascular: Maintained proximal large arterial flow voids.

Skull and upper cervical spine: Scalloping of the right temporal
bone bilaterally, better appreciated on the same-day head CT and
likely secondary to prominent arachnoid granulations. Please refer
to the head CT performed earlier today for a description of right
middle ear and mastoid findings raising suspicion for cholesteatoma.

Sinuses/Orbits: Visualized orbits show no acute finding. Chronic
opacification of an atelectatic left maxillary sinus. Partial
opacification of the left ethmoid air cells.

Other: Left posterior scalp soft tissue swelling. Left mastoid
effusion.
IMPRESSION: Significantly motion degraded examination (with severe motion
degradation of many sequences), as described and significantly
limiting evaluation.

The diffusion-weighted imaging is of good quality. No evidence of
acute infarction.

Moderate chronic small-vessel ischemic changes within the cerebral
white matter, better appreciated on the prior brain MRI of
[DATE].

Age-advanced supratentorial and infratentorial parenchymal atrophy.

Please refer to the same-day head CT for description of right middle
ear and mastoid findings suspicious for sequela of cholesteatoma.
Left mastoid effusion also present.

Paranasal sinus disease, as described.

Left posterior scalp soft tissue swelling.

## 2021-06-09 IMAGING — CT CT CERVICAL SPINE W/O CM
3 of 4 series · 13 of 33 positions shown, 16 images · non-contrast
Comparison: [DATE]

CLINICAL DATA: Neck trauma, mechanically unstable. History of down
syndrome

EXAM:
CT HEAD WITHOUT CONTRAST
CT CERVICAL SPINE WITHOUT CONTRAST
TECHNIQUE: Multidetector CT imaging of the head and cervical spine was
performed following the standard protocol without intravenous
contrast. Multiplanar CT image reconstructions of the cervical spine
were also generated.

[Series 13: sag bone · sagittal · 0.29mm/px · 5 of 88 slices shown, 6 images]
[im 30/88  bone]
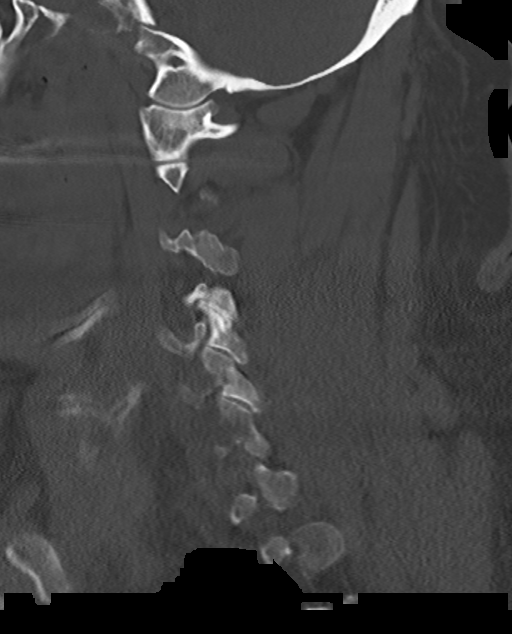
[im 37/88  bone]
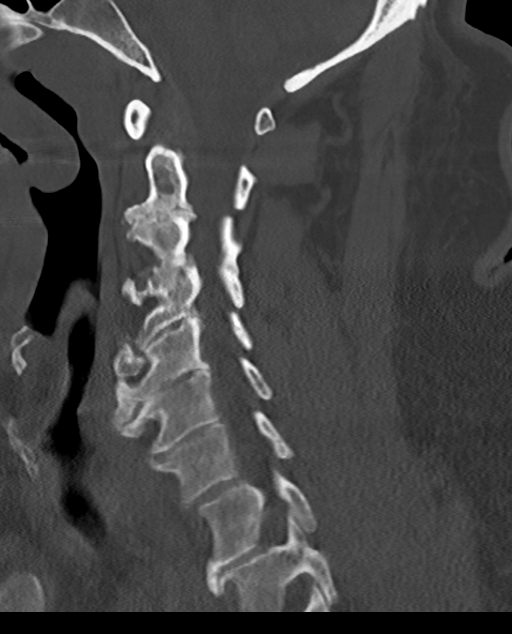
[im 44/88  soft-tissue]
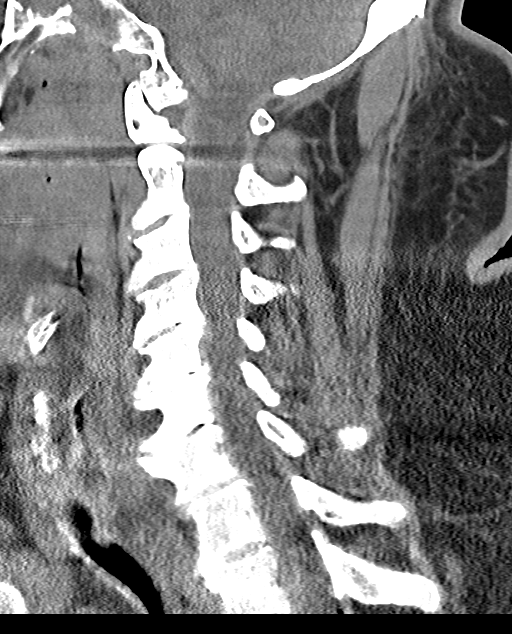
[im 44/88  bone]
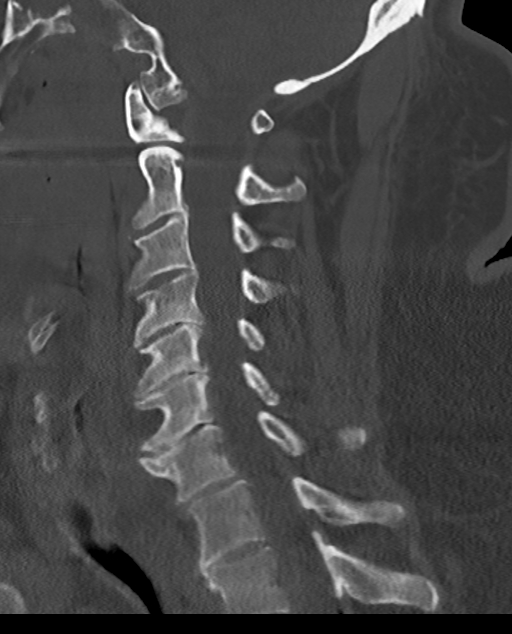
[im 51/88  bone]
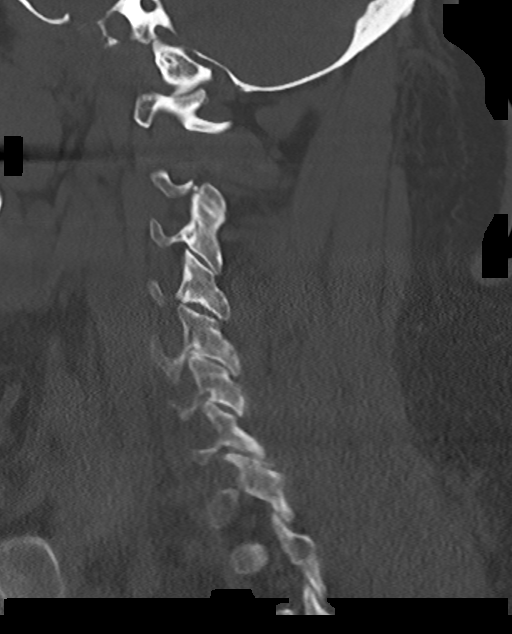
[im 59/88  bone]
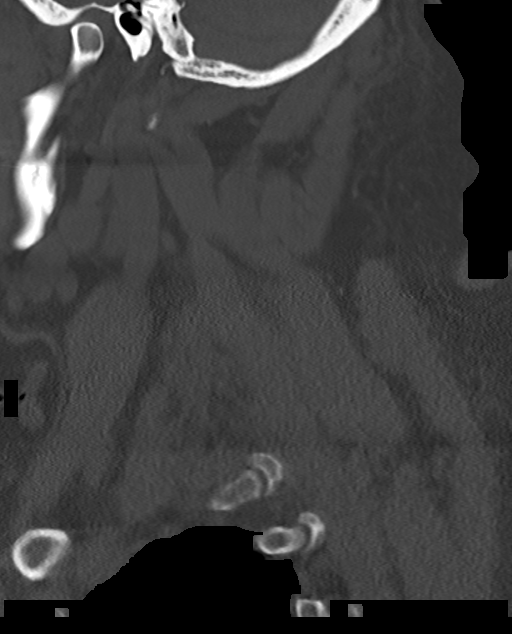

[Series 14: cor bone · coronal · 0.31mm/px · 3 of 70 slices shown]
[im 14/70  bone]
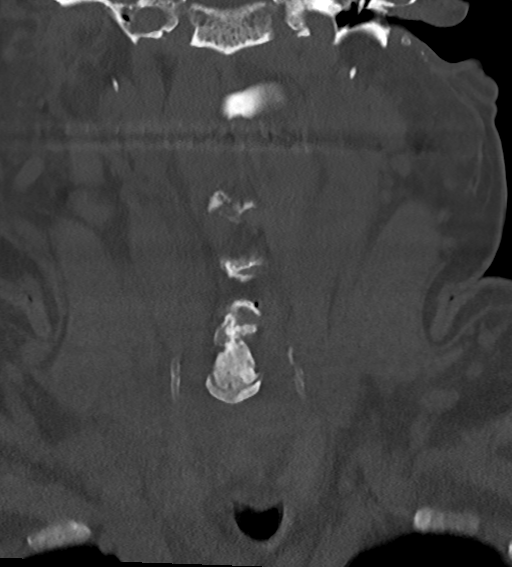
[im 28/70  bone]
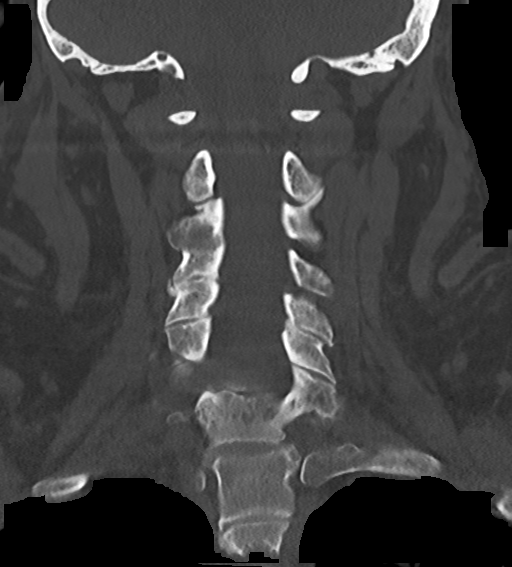
[im 42/70  bone]
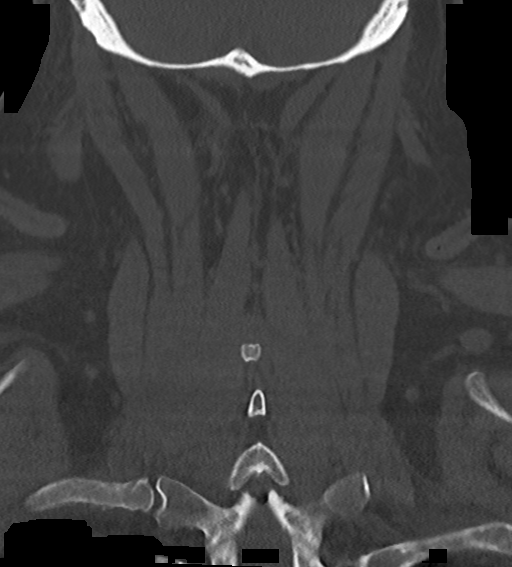

[Series 15: orthogonal axials · axial · 0.21mm/px · z∈[-284,-173]mm · 5 of 89 slices shown, 7 images]
[im 15/89  soft-tissue]
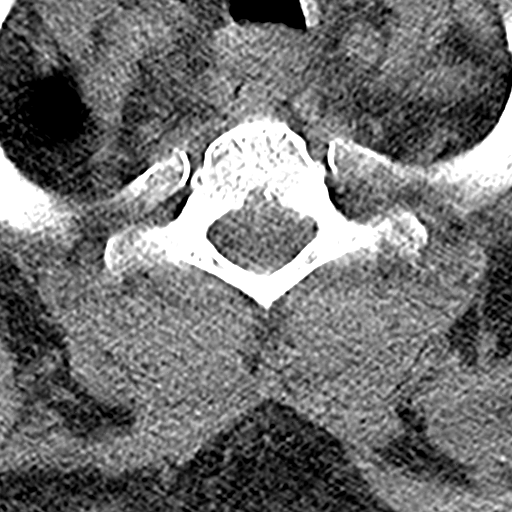
[im 15/89  bone]
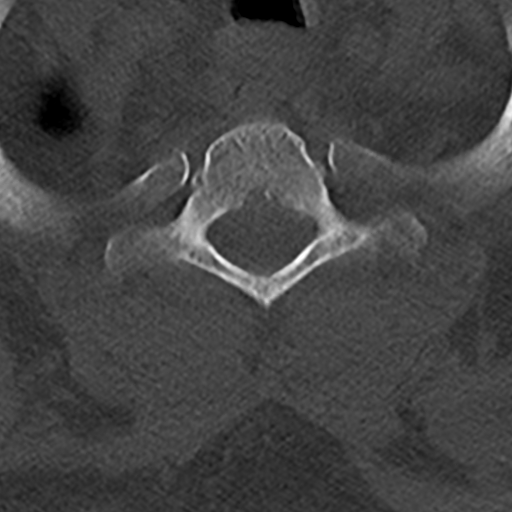
[im 30/89  bone]
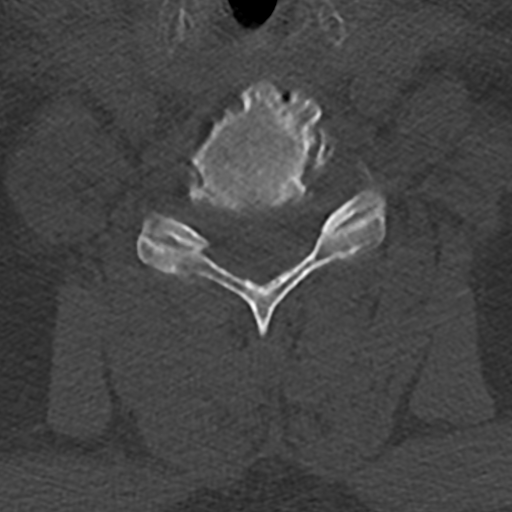
[im 45/89  bone]
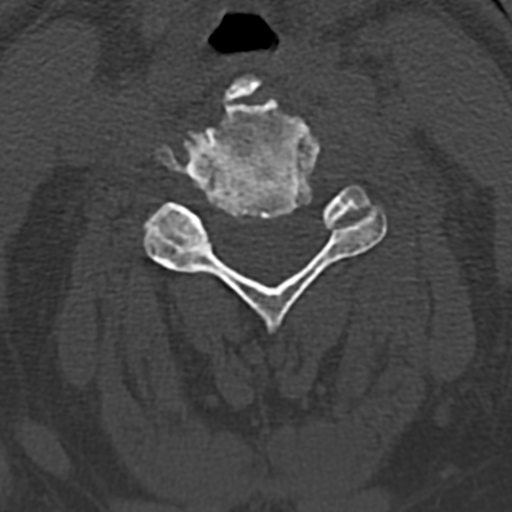
[im 59/89  bone]
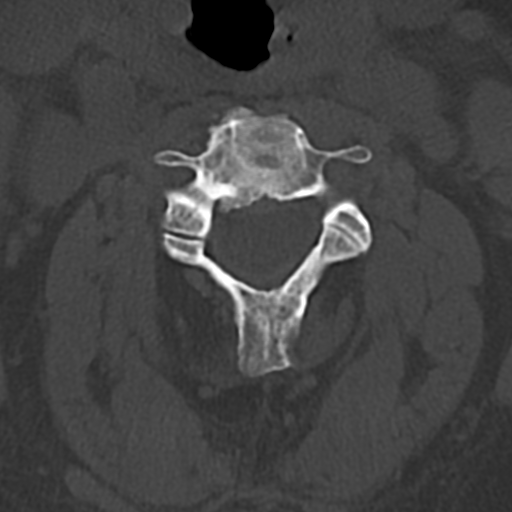
[im 74/89  soft-tissue]
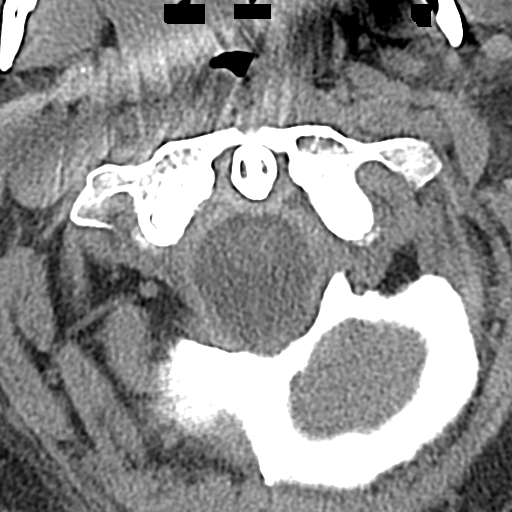
[im 74/89  bone]
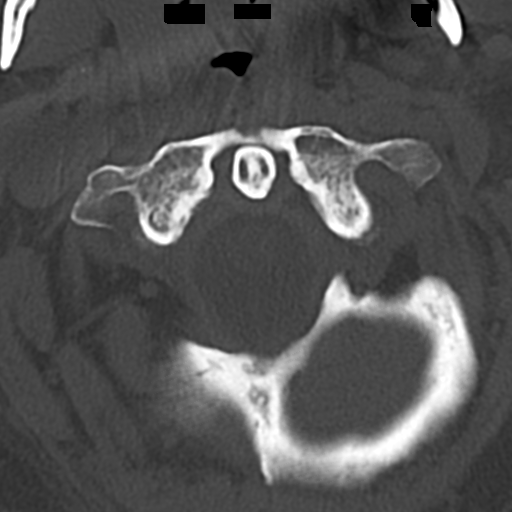

[13 of 33 positions shown; findings below may reference images not displayed]

FINDINGS: CT HEAD FINDINGS

Brain: No evidence of acute infarction, hemorrhage, hydrocephalus,
extra-axial collection or mass lesion/mass effect.

Vascular: No hyperdense vessel or unexpected calcification.

Skull: No acute fracture.

Sinuses/Orbits: Chronic left maxillary sinusitis with opacification
and atelectasis. Adenoid thickening which is symmetric.
Underpneumatized and opacified right mastoid air cells with
imperceptible tegmen mastoideum. Adjacent scalloping of the temporal
bone is symmetric and attributed to arachnoid granulations.

CT CERVICAL SPINE FINDINGS

Alignment: Normal

Skull base and vertebrae: No acute fracture. No primary bone lesion
or focal pathologic process.

Soft tissues and spinal canal: No prevertebral fluid or swelling. No
visible canal hematoma.

Disc levels: Generalized degenerative disc space narrowing and
ventral spurring. Mainly upper cervical facet spurring with C3-4
ankylosis.

Upper chest: Mild opacity in the subpleural right apex, usually
scarring. Degree of tracheomalacia.
IMPRESSION: 1. No evidence of acute intracranial or cervical spine injury.
Normal craniocervical alignment.
2. Atrophic brain.
3. Chronic right ear with mastoid opacification and erosion
suggesting cholesteatoma.

## 2021-06-09 IMAGING — DX DG CHEST 1V PORT
1 series · 1 of 1 positions shown · non-contrast
Comparison: Radiograph [DATE] and [DATE]. No recent prior
studies available.

CLINICAL DATA: Cough.  Follow-up pneumonia.

EXAM:
PORTABLE CHEST 1 VIEW

[chest ap]
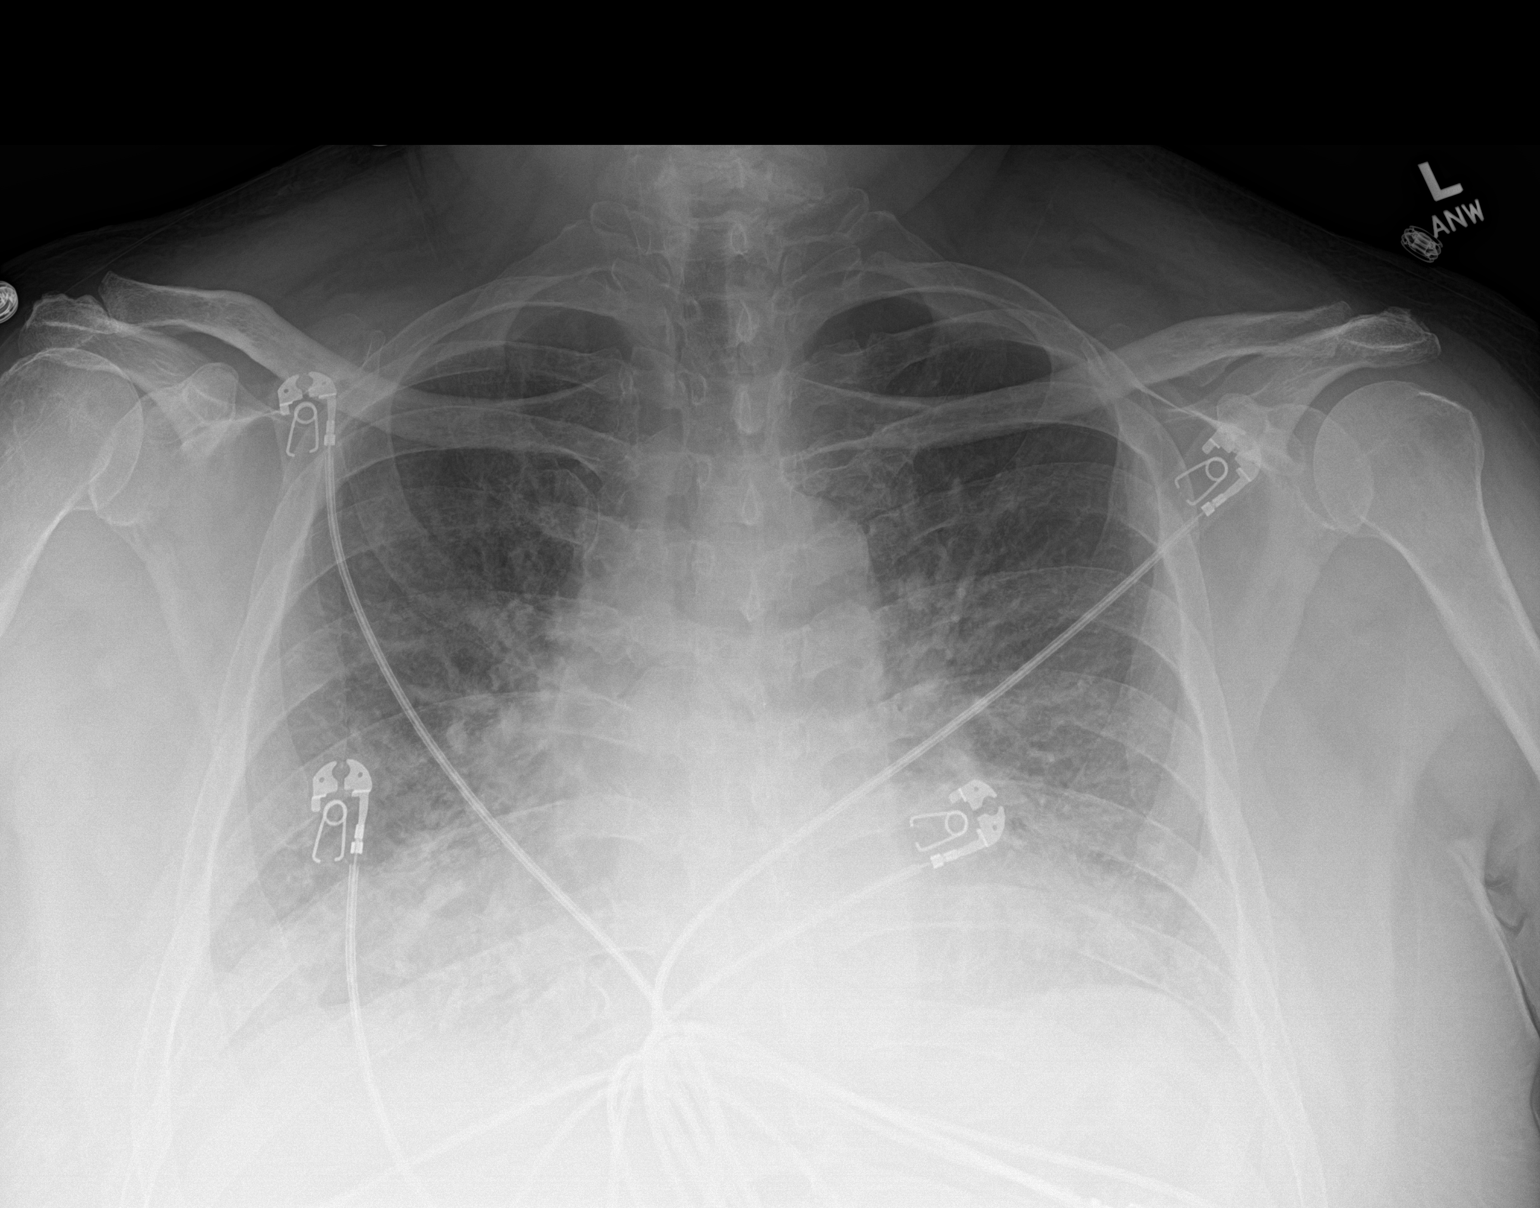

[1 of 1 positions shown; findings below may reference images not displayed]

FINDINGS: [GR] hours. Stable cardiomegaly and prominent epicardial fat at the
right cardiophrenic angle. Diffuse interstitial prominence is again
noted, minimally improved from the most recent prior study of 6
weeks ago. There is no confluent airspace opacity, pleural effusion
or pneumothorax. The bones appear unremarkable. Telemetry leads
overlie the chest.
IMPRESSION: 1. Diffuse interstitial prominence, mildly improved from most recent
study, although increased from older prior studies. This suggests
improving edema, although there may be a component of underlying
interstitial lung disease.
2. No focal airspace disease.

## 2021-06-09 IMAGING — CT CT HEAD W/O CM
4 series · 16 of 47 positions shown, 18 images · non-contrast
Comparison: [DATE]

CLINICAL DATA: Neck trauma, mechanically unstable. History of down
syndrome

EXAM:
CT HEAD WITHOUT CONTRAST
CT CERVICAL SPINE WITHOUT CONTRAST
TECHNIQUE: Multidetector CT imaging of the head and cervical spine was
performed following the standard protocol without intravenous
contrast. Multiplanar CT image reconstructions of the cervical spine
were also generated.

[Series 503: cor soft · coronal · 0.33mm/px · 3 of 75 slices shown]
[im 25/75  brain]
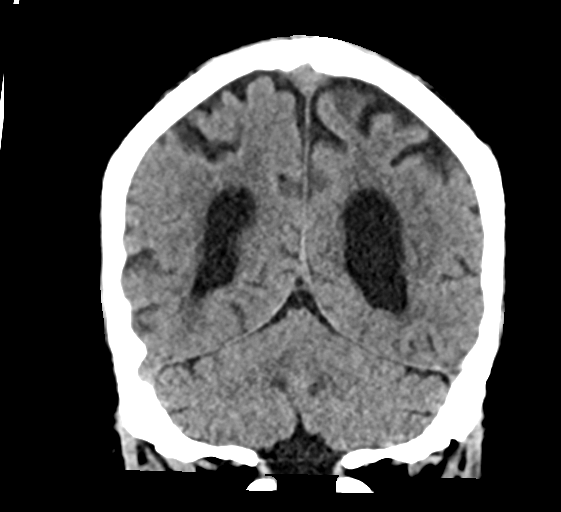
[im 33/75  brain]
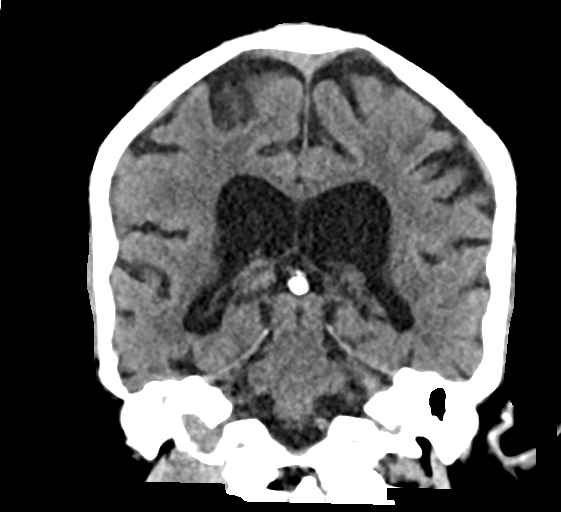
[im 42/75  brain]
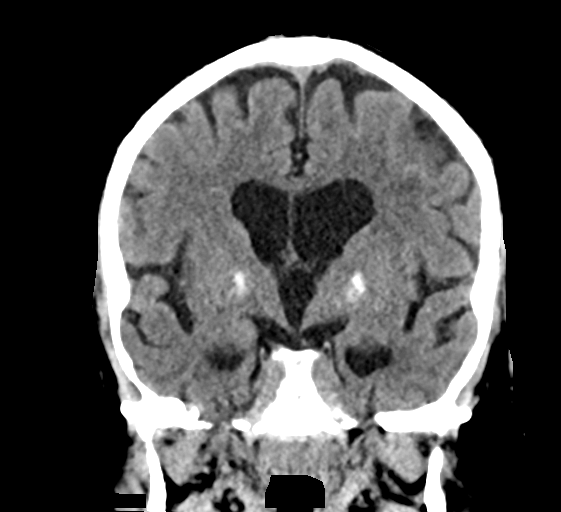

[Series 504: sag soft · sagittal · 0.33mm/px · 3 of 56 slices shown]
[im 19/56  brain]
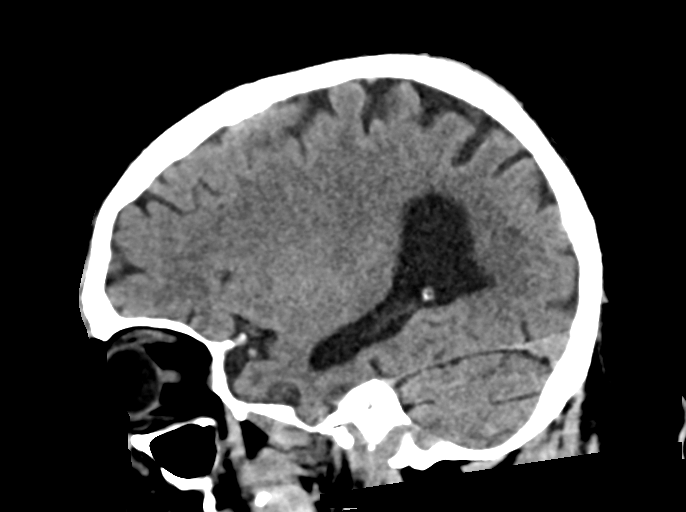
[im 28/56  brain]
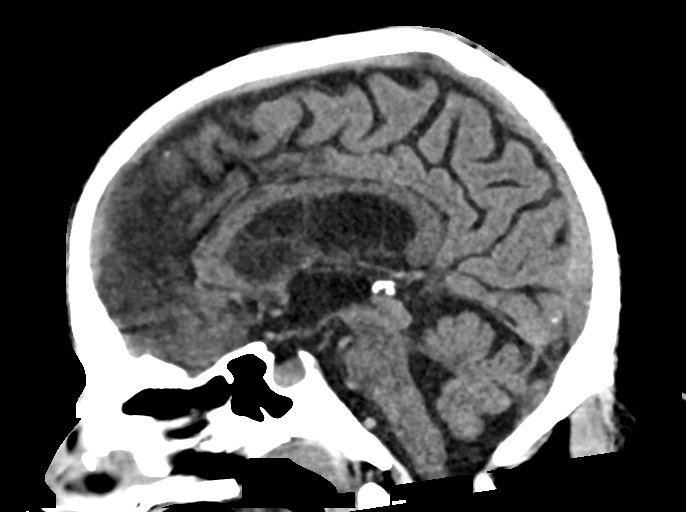
[im 37/56  brain]
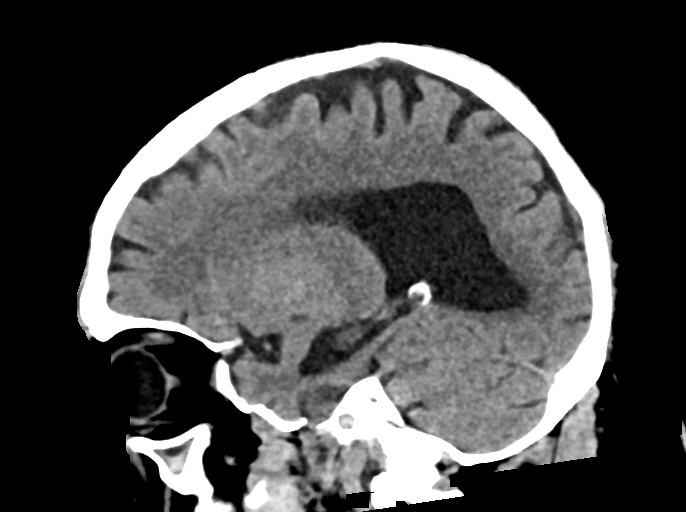

[Series 505: head bone · axial · 0.41mm/px · z∈[-138,-106]mm · 3 of 84 slices shown]
[im 9/84  bone]
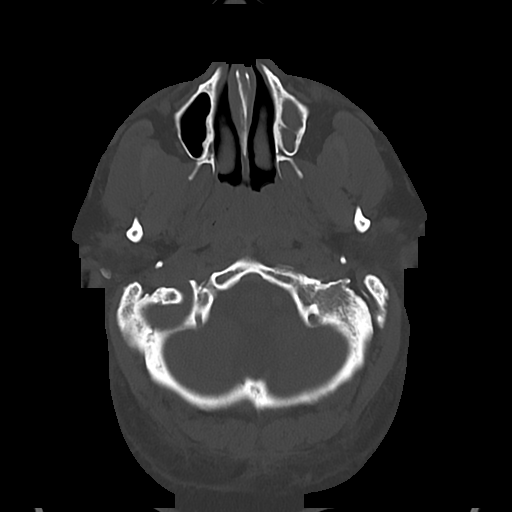
[im 17/84  bone]
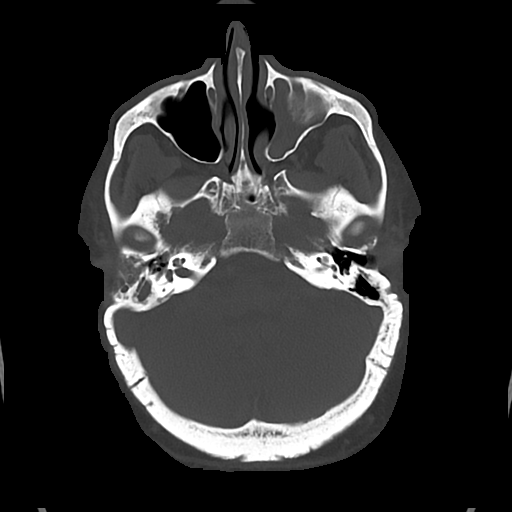
[im 25/84  bone]
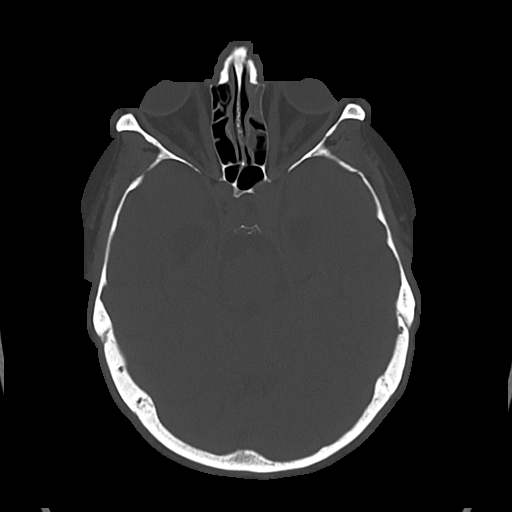

[Series 506: head wo · axial · 0.41mm/px · z∈[-134,-14]mm · 7 of 34 slices shown, 9 images]
[im 5/34  brain]
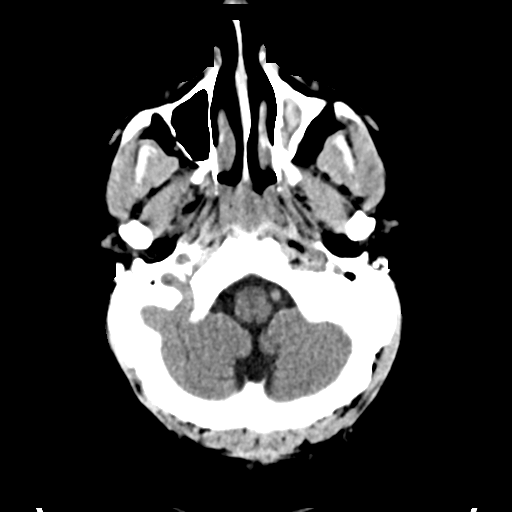
[im 5/34  bone]
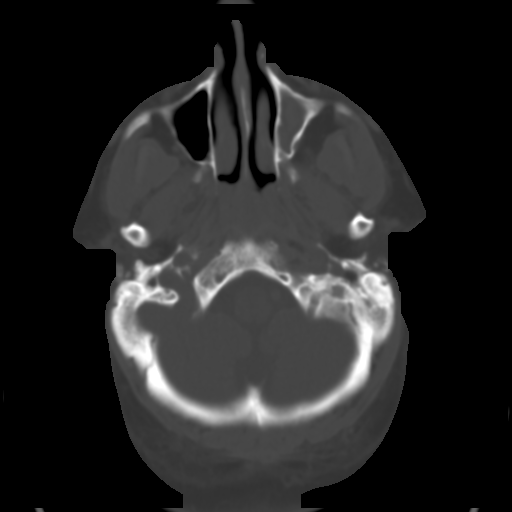
[im 9/34  brain]
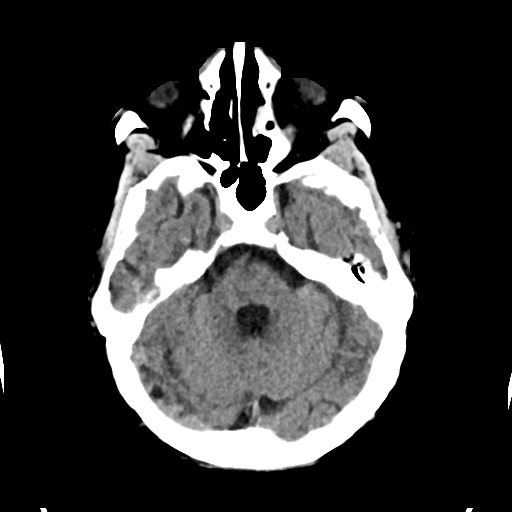
[im 13/34  brain]
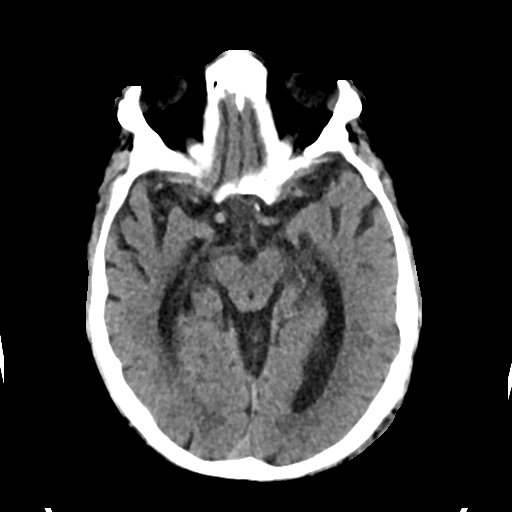
[im 17/34  brain]
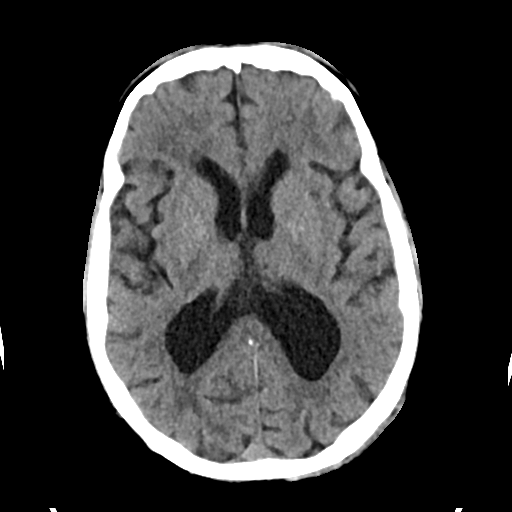
[im 21/34  brain]
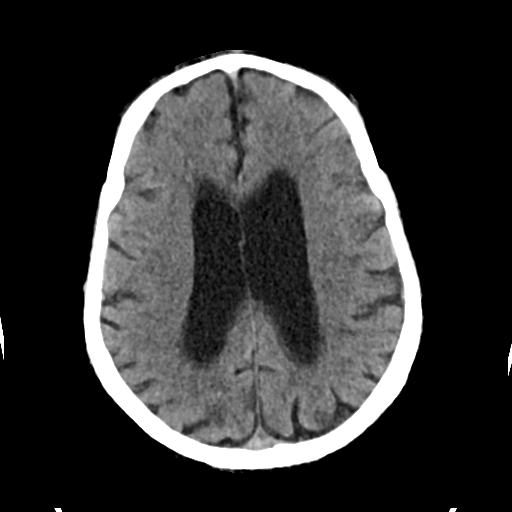
[im 21/34  bone]
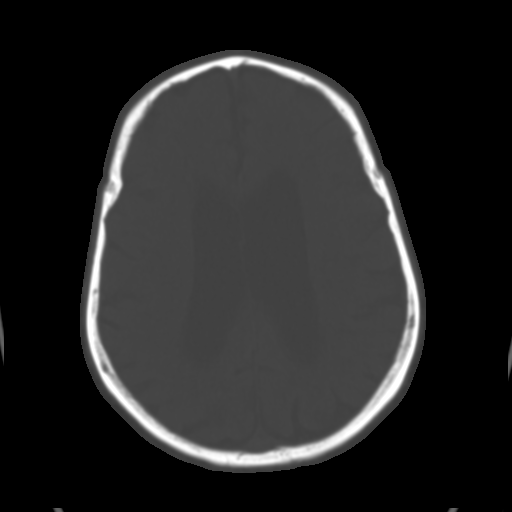
[im 25/34  brain]
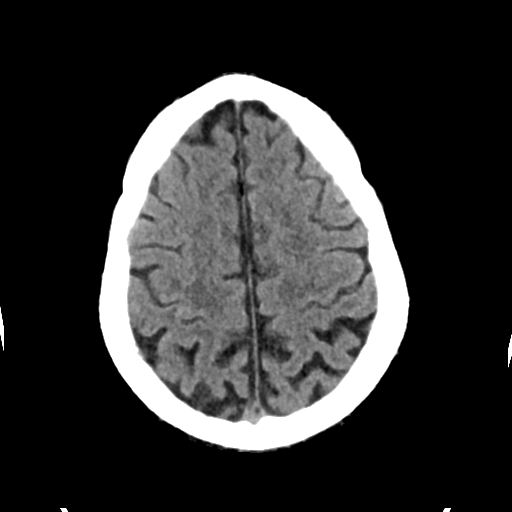
[im 29/34  brain]
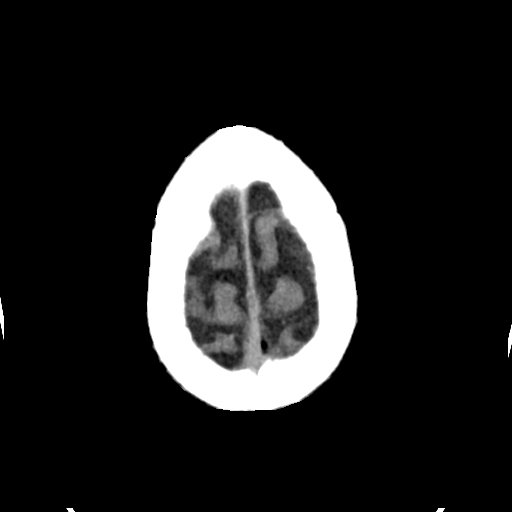

[16 of 47 positions shown; findings below may reference images not displayed]

FINDINGS: CT HEAD FINDINGS

Brain: No evidence of acute infarction, hemorrhage, hydrocephalus,
extra-axial collection or mass lesion/mass effect.

Vascular: No hyperdense vessel or unexpected calcification.

Skull: No acute fracture.

Sinuses/Orbits: Chronic left maxillary sinusitis with opacification
and atelectasis. Adenoid thickening which is symmetric.
Underpneumatized and opacified right mastoid air cells with
imperceptible tegmen mastoideum. Adjacent scalloping of the temporal
bone is symmetric and attributed to arachnoid granulations.

CT CERVICAL SPINE FINDINGS

Alignment: Normal

Skull base and vertebrae: No acute fracture. No primary bone lesion
or focal pathologic process.

Soft tissues and spinal canal: No prevertebral fluid or swelling. No
visible canal hematoma.

Disc levels: Generalized degenerative disc space narrowing and
ventral spurring. Mainly upper cervical facet spurring with C3-4
ankylosis.

Upper chest: Mild opacity in the subpleural right apex, usually
scarring. Degree of tracheomalacia.
IMPRESSION: 1. No evidence of acute intracranial or cervical spine injury.
Normal craniocervical alignment.
2. Atrophic brain.
3. Chronic right ear with mastoid opacification and erosion
suggesting cholesteatoma.

## 2021-06-09 MED ORDER — SODIUM CHLORIDE 0.9 % IV SOLN
75.0000 mL/h | INTRAVENOUS | Status: DC
  Administered 2021-06-09 (×2): 75 mL/h via INTRAVENOUS

## 2021-06-09 MED ORDER — RIVAROXABAN 20 MG PO TABS: 20.0000 mg | ORAL_TABLET | Freq: Every day | ORAL | Status: DC

## 2021-06-09 MED ORDER — ACETAMINOPHEN 325 MG PO TABS: 650.0000 mg | ORAL_TABLET | ORAL | Status: DC | PRN

## 2021-06-09 MED ORDER — RIVAROXABAN 15 MG PO TABS
15.0000 mg | ORAL_TABLET | Freq: Two times a day (BID) | ORAL | Status: DC
  Administered 2021-06-09 – 2021-06-10 (×3): 15 mg via ORAL
  Filled 2021-06-09 (×5): qty 1

## 2021-06-09 MED ORDER — EZETIMIBE 10 MG PO TABS
10.0000 mg | ORAL_TABLET | Freq: Every day | ORAL | Status: DC
  Administered 2021-06-09 – 2021-06-10 (×2): 10 mg via ORAL
  Filled 2021-06-09 (×3): qty 1

## 2021-06-09 MED ORDER — LAMOTRIGINE 25 MG PO TABS: 25.0000 mg | ORAL_TABLET | Freq: Every day | ORAL | Status: DC

## 2021-06-09 MED ORDER — LAMOTRIGINE 25 MG PO TABS
25.0000 mg | ORAL_TABLET | Freq: Two times a day (BID) | ORAL | Status: DC
  Administered 2021-06-11: 25 mg via ORAL
  Filled 2021-06-09: qty 1

## 2021-06-09 MED ORDER — ACETAMINOPHEN 650 MG RE SUPP: 650.0000 mg | RECTAL | Status: DC | PRN

## 2021-06-09 MED ORDER — SODIUM CHLORIDE 0.9 % IV SOLN
100.0000 mg | Freq: Two times a day (BID) | INTRAVENOUS | Status: DC
  Filled 2021-06-09: qty 10

## 2021-06-09 MED ORDER — LACOSAMIDE 50 MG PO TABS
100.0000 mg | ORAL_TABLET | Freq: Two times a day (BID) | ORAL | Status: DC
  Administered 2021-06-10 – 2021-06-11 (×3): 100 mg via ORAL
  Filled 2021-06-09 (×3): qty 2

## 2021-06-09 MED ORDER — LAMOTRIGINE 100 MG PO TABS: 100.0000 mg | ORAL_TABLET | Freq: Two times a day (BID) | ORAL | Status: DC

## 2021-06-09 MED ORDER — LAMOTRIGINE 25 MG PO TABS: 75.0000 mg | ORAL_TABLET | Freq: Two times a day (BID) | ORAL | Status: DC

## 2021-06-09 MED ORDER — SODIUM CHLORIDE 0.9 % IV SOLN
200.0000 mg | Freq: Once | INTRAVENOUS | Status: AC
  Administered 2021-06-09: 200 mg via INTRAVENOUS
  Filled 2021-06-09: qty 20

## 2021-06-09 MED ORDER — SODIUM CHLORIDE 0.9 % IV SOLN: 75.0000 mL/h | Freq: Once | INTRAVENOUS | Status: DC

## 2021-06-09 MED ORDER — CHLORHEXIDINE GLUCONATE 0.12% ORAL RINSE (MEDLINE KIT)
15.0000 mL | Freq: Two times a day (BID) | OROMUCOSAL | Status: DC
  Administered 2021-06-09 – 2021-06-10 (×4): 15 mL via OROMUCOSAL

## 2021-06-09 MED ORDER — ALBUTEROL SULFATE (2.5 MG/3ML) 0.083% IN NEBU: 2.5000 mg | INHALATION_SOLUTION | Freq: Four times a day (QID) | RESPIRATORY_TRACT | Status: DC | PRN

## 2021-06-09 MED ORDER — LAMOTRIGINE 25 MG PO TABS
25.0000 mg | ORAL_TABLET | Freq: Every day | ORAL | Status: AC
  Administered 2021-06-09 – 2021-06-10 (×2): 25 mg via ORAL
  Filled 2021-06-09 (×3): qty 1

## 2021-06-09 MED ORDER — ORAL CARE MOUTH RINSE
15.0000 mL | OROMUCOSAL | Status: DC
  Administered 2021-06-09 – 2021-06-10 (×11): 15 mL via OROMUCOSAL

## 2021-06-09 MED ORDER — LAMOTRIGINE 25 MG PO TABS: 50.0000 mg | ORAL_TABLET | Freq: Two times a day (BID) | ORAL | Status: DC

## 2021-06-09 MED ORDER — RIVAROXABAN 15 MG PO TABS: 15.0000 mg | ORAL_TABLET | Freq: Two times a day (BID) | ORAL | Status: DC

## 2021-06-09 MED ORDER — ACETAMINOPHEN 325 MG PO TABS
650.0000 mg | ORAL_TABLET | Freq: Once | ORAL | Status: AC
  Administered 2021-06-09: 650 mg via ORAL
  Filled 2021-06-09: qty 2

## 2021-06-09 MED ORDER — LORAZEPAM 2 MG/ML IJ SOLN: 1.0000 mg | Freq: Four times a day (QID) | INTRAMUSCULAR | Status: DC | PRN

## 2021-06-09 NOTE — ED Notes (Signed)
Assisted patient to bedside commode. Mom at bedside.

## 2021-06-09 NOTE — Progress Notes (Signed)
PT Cancellation Note  Patient Details Name: Joseph Williams MRN: [id number] DOB: [birth year]   Cancelled Treatment:    Reason Eval/Treat Not Completed: Patient at procedure or test/unavailable. Pt getting set-up for EEG that the tech reported would last ~1 hour. Will plan to follow-up tomorrow.   Moishe Spice, PT, DPT [facility]  Pager: [phone] Office: [place] 06/09/2021, 5:51 PM

## 2021-06-09 NOTE — Progress Notes (Signed)
Routine EEG complete results pending

## 2021-06-09 NOTE — H&P (Addendum)
History and Physical    Joseph Williams [id number] DOB: Feb 05, 1966 DOA: 06/09/2021  Referring MD/NP/PA: Margarita Mail, PA-C PCP: Willey Blade, MD  Consultants: NeurologyVikram Leta Baptist, MD Patient coming from: Home via EMS  Chief Complaint: Seizure  I have personally briefly reviewed patient's old medical records in [place]   HPI: Joseph Williams is a 55 y.o. male with medical history significant of Down syndrome (high functioning), hyperlipidemia, DVT on Xarelto, and seizure presents after having witnessed seizure this morning.  His mother helps provide medical history as patient has Down syndrome.  At baseline he had been previously fairly independent with cleaning, toileting, feeding himself, and was able to walk without assistance.  Around 6:45 AM this morning he had gotten up and gone to the bathroom and his mother had assisted him.  As he seemed to be doing okay she left at that time, but heard him say "awwh" before hearing him fall.  When she came into the bathroom she saw jerking of his upper and lower extremities that lasted seconds to a minute.  Denied any loss of bowel or bladder.  He did hit his left shoulder and the back of his head.  Patient has seemed to decline after he had eye surgery on July 6 and July 20 requiring anesthesia.  She does not note distressed, but he previously had no issues prior.  He had been seen in the emergency department on 7/23 after having a fall, but at that time no seizure-like activity was witnessed.  He was admitted on 8/2 after having a fall and was witnessed having rhythmic shaking of his legs with concern for postictal phase after resolution of symptoms.  He also was found to have fractures of the second metatarsal and base of the first distal phalanx.  At that time MRI did not note any acute abnormalities and he was recommended to start on Keppra 500 mg twice daily and follow-up with neurology in the outpatient setting.  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  His mother states that the medication made him a completely different person and was no longer able to laugh and reportedly was having hallucinations stations.  They followed up with neurology who recommended decreasing dose to 250 mg twice daily.  Despite the decreased dose the patient was still having hallucinations, had lost about 12 pounds in weight, having difficulty feeding himself, and needing assistance with walking.  Thereafter it was recommended to use taper off of Keppra which had been done and he was started on Lamictal 25 mg daily.  Patient is supposed to start Lamictal 25 mg twice daily next week.  His mother states that the patient seemed to be getting back to his normal self here lately.  Since fracturing his foot he had been in a makeshift boot and follow-up with orthopedics.  Patient had developed a productive cough that was keeping him up overnight and last week he had been started on azithromycin for possible upper respiratory infection/pneumonia.  He just recently had developed swelling of his right lower extremity had a Doppler ultrasound which revealed a DVT.  Patient had been started on Xarelto.   ED Course: Upon admission to the emergency department patient was seen to have stable vital signs.  CT scan of the head and cervical spine did not show any signs of a intracranial or cervical spine injury atrophic brain with chronic right ear  opacification and erosion suggesting cholesteatoma.  Labs significant for albumin 2.5.  Neurology recommended obtaining EEG and MRI with and without contrast of the brain.  TRH called to admit.  Review of Systems  Unable to perform ROS: Medical condition  Constitutional:  Positive for malaise/fatigue and weight loss. Negative for fever.  Eyes:  Negative for photophobia and pain.  Respiratory:  Positive for cough.    Cardiovascular:  Positive for leg swelling. Negative for chest pain.  Gastrointestinal:  Negative for abdominal pain, nausea and vomiting.  Genitourinary:  Negative for dysuria.  Musculoskeletal:  Positive for falls, joint pain and myalgias.  Neurological:  Positive for seizures and loss of consciousness.   Past Medical History:  Diagnosis Date   Down's syndrome    H/O echocardiogram 01/12/2012   Normal left ventricular wall thickness; Left ventricular systolic function is normal;  there is trace mitral and tricuspid regurgitation. EF 74%   High cholesterol    Persistent pneumonia     Past Surgical History:  Procedure Laterality Date   EYE SURGERY  04/2005   HERNIA REPAIR  10/06/2004   bilateal inguinal and ventral     reports that he has never smoked. He has never used smokeless tobacco. He reports that he does not drink alcohol and does not use drugs.  No Known Allergies  Family History  Problem Relation Age of Onset   Heart attack Father    Breast cancer Mother        x 3   Throat cancer Maternal Grandfather     Prior to Admission medications   Medication Sig Start Date End Date Taking? Authorizing Provider  Ascorbic Acid (VITAMIN C) 500 MG CAPS Take 1 tablet by mouth daily. Take 1 tablet by mouth once daily   Yes [provider]  aspirin 81 MG tablet Take 81 mg by mouth daily.   Yes [provider]  azithromycin (ZITHROMAX) 250 MG tablet Take 250-500 mg by mouth daily. Patient takes 500 mg for day 1. Then 250 mg every day for 4 days. Completed 06-08-21   Yes [provider]  cholecalciferol (VITAMIN D) 1000 UNITS tablet Take 1,000 Units by mouth daily.   Yes [provider]  Coenzyme Q10 300 MG CAPS Take 1 capsule by mouth daily. Take 1 capsule by mouth once daily   Yes [provider]  Cyanocobalamin (B-12) 1000 MCG CAPS Take by mouth daily.   Yes [provider]  ezetimibe (ZETIA) 10 MG tablet Take by mouth daily.    Yes [provider]  fish oil-omega-3 fatty acids 1000 MG capsule Take 1 capsule by mouth daily. Take 1 capsule by mouth once daily   Yes [provider]  Homeopathic Products (ZINC) LOZG See admin instructions.   Yes [provider]  lamoTRIgine (LAMICTAL) 25 MG tablet Take '25mg'$  daily for 2 weeks; then take '25mg'$  twice a day for 2 weeks; then take '50mg'$  twice a day for 2 weeks; then take '75mg'$  twice a day for 2 weeks; then '100mg'$  twice a day 05/28/21  Yes Penumalli, Earlean Polka, MD  Multiple Minerals ([place]) TABS Take by mouth.   Yes [provider]  Probiotic Product (PROBIOTIC-10 PO) Take 1 capsule by mouth daily. Take 1 capsule by mouth once daily   Yes [provider]  Rivaroxaban (XARELTO) 15 MG TABS tablet Take 15 mg by mouth 2 (two) times daily with a meal. Started 06-06-21 for 3 weeks. ( On dose pack )   Yes  [provider]  DUREZOL 0.05 % EMUL Apply 1 drop to eye 4 (four) times daily. Patient not taking: Reported on 06/09/2021 05/03/21   [provider]  ketorolac (ACULAR) 0.5 % ophthalmic solution 1 drop 4 (four) times daily. Patient not taking: Reported on 06/09/2021    [provider]    Physical Exam:  Constitutional: Obese male currently in no acute distress Vitals:   06/09/21 0810 06/09/21 0818 06/09/21 1000  BP:  114/72 124/74  Pulse:  80 86  Resp:  17 18  Temp:  97.8 F (36.6 C)   TempSrc:  Oral   SpO2: 95% 95% 99%  Weight:  99.8 kg   Height:  5' (1.524 m)    Eyes: PERRL, lids and conjunctivae normal ENMT: Mucous membranes are moist. Posterior pharynx clear of any exudate or lesions  Neck: normal, supple, no masses, no thyromegaly.  No JVD Respiratory: clear to auscultation bilaterally, no wheezing, no crackles. Normal respiratory effort. No accessory muscle use.  Cardiovascular: Regular rate and rhythm, no murmurs / rubs / gallops.  Right lower extremity larger than the left with plus pitting  edema.  2+ pedal pulses. No carotid bruits.  Abdomen: no tenderness, no masses palpated. No hepatosplenomegaly. Bowel sounds positive.  Musculoskeletal: no clubbing / cyanosis. No joint deformity upper and lower extremities.  Skin: Bruise noted of the left shoulder. Neurologic: CN 2-12 grossly intact.  Strength 4/5 in all 4 extremities. Psychiatric: Alert and oriented person place.  Normal mood    Labs on Admission: I have personally reviewed following labs and imaging studies  CBC: Recent Labs  Lab 06/09/21 0900  WBC 4.2  NEUTROABS 2.8  HGB 13.3  HCT 40.2  MCV 103.3*  PLT [id number]   Basic Metabolic Panel: Recent Labs  Lab 06/09/21 0900  NA 139  K 3.7  CL 107  CO2 26  GLUCOSE 100*  BUN 9  CREATININE 0.98  CALCIUM 8.3*   GFR: Estimated Creatinine Clearance: 87.2 mL/min (by C-G formula based on SCr of 0.98 mg/dL). Liver Function Tests: Recent Labs  Lab 06/09/21 0900  AST 25  ALT 18  ALKPHOS 78  BILITOT 0.4  PROT 6.4*  ALBUMIN 2.5*   No results for input(s): LIPASE, AMYLASE in the last 168 hours. No results for input(s): AMMONIA in the last 168 hours. Coagulation Profile: No results for input(s): INR, PROTIME in the last 168 hours. Cardiac Enzymes: No results for input(s): CKTOTAL, CKMB, CKMBINDEX, TROPONINI in the last 168 hours. BNP (last 3 results) No results for input(s): PROBNP in the last 8760 hours. HbA1C: No results for input(s): HGBA1C in the last 72 hours. CBG: Recent Labs  Lab 06/09/21 0921  GLUCAP 92   Lipid Profile: No results for input(s): CHOL, HDL, LDLCALC, TRIG, CHOLHDL, LDLDIRECT in the last 72 hours. Thyroid Function Tests: No results for input(s): TSH, T4TOTAL, FREET4, T3FREE, THYROIDAB in the last 72 hours. Anemia Panel: No results for input(s): VITAMINB12, FOLATE, FERRITIN, TIBC, IRON, RETICCTPCT in the last 72 hours. Urine analysis:    Component Value Date/Time   COLORURINE YELLOW 05/07/2021 1218   APPEARANCEUR HAZY (A)  05/07/2021 1218   LABSPEC 1.010 05/07/2021 1218   PHURINE 7.0 05/07/2021 [place] 05/07/2021 [place] 05/07/2021 [place] 05/07/2021 [place] 05/07/2021 1218   PROTEINUR NEGATIVE 05/07/2021 1218   NITRITE NEGATIVE 05/07/2021 1218   LEUKOCYTESUR NEGATIVE 05/07/2021 1218   Sepsis Labs: No results found for  this or any previous visit (from the past 240 hour(s)).   Radiological Exams on Admission: CT HEAD WO CONTRAST (5MM)  Result Date: 06/09/2021 CLINICAL DATA:  Neck trauma, mechanically unstable. History of down syndrome EXAM: CT HEAD WITHOUT CONTRAST CT CERVICAL SPINE WITHOUT CONTRAST TECHNIQUE: Multidetector CT imaging of the head and cervical spine was performed following the standard protocol without intravenous contrast. Multiplanar CT image reconstructions of the cervical spine were also generated. COMPARISON:  10/28/2020 FINDINGS: CT HEAD FINDINGS Brain: No evidence of acute infarction, hemorrhage, hydrocephalus, extra-axial collection or mass lesion/mass effect. Vascular: No hyperdense vessel or unexpected calcification. Skull: No acute fracture. Sinuses/Orbits: Chronic left maxillary sinusitis with opacification and atelectasis. Adenoid thickening which is symmetric. Underpneumatized and opacified right mastoid air cells with imperceptible tegmen mastoideum. Adjacent scalloping of the temporal bone is symmetric and attributed to arachnoid granulations. CT CERVICAL SPINE FINDINGS Alignment: Normal Skull base and vertebrae: No acute fracture. No primary bone lesion or focal pathologic process. Soft tissues and spinal canal: No prevertebral fluid or swelling. No visible canal hematoma. Disc levels: Generalized degenerative disc space narrowing and ventral spurring. Mainly upper cervical facet spurring with C3-4 ankylosis. Upper chest: Mild opacity in the subpleural right apex, usually scarring. Degree of tracheomalacia. IMPRESSION:  1. No evidence of acute intracranial or cervical spine injury. Normal craniocervical alignment. 2. Atrophic brain. 3. Chronic right ear with mastoid opacification and erosion suggesting cholesteatoma. Electronically Signed   By: Monte Fantasia M.D.   On: 06/09/2021 09:23   CT Cervical Spine Wo Contrast  Result Date: 06/09/2021 CLINICAL DATA:  Neck trauma, mechanically unstable. History of down syndrome EXAM: CT HEAD WITHOUT CONTRAST CT CERVICAL SPINE WITHOUT CONTRAST TECHNIQUE: Multidetector CT imaging of the head and cervical spine was performed following the standard protocol without intravenous contrast. Multiplanar CT image reconstructions of the cervical spine were also generated. COMPARISON:  10/28/2020 FINDINGS: CT HEAD FINDINGS Brain: No evidence of acute infarction, hemorrhage, hydrocephalus, extra-axial collection or mass lesion/mass effect. Vascular: No hyperdense vessel or unexpected calcification. Skull: No acute fracture. Sinuses/Orbits: Chronic left maxillary sinusitis with opacification and atelectasis. Adenoid thickening which is symmetric. Underpneumatized and opacified right mastoid air cells with imperceptible tegmen mastoideum. Adjacent scalloping of the temporal bone is symmetric and attributed to arachnoid granulations. CT CERVICAL SPINE FINDINGS Alignment: Normal Skull base and vertebrae: No acute fracture. No primary bone lesion or focal pathologic process. Soft tissues and spinal canal: No prevertebral fluid or swelling. No visible canal hematoma. Disc levels: Generalized degenerative disc space narrowing and ventral spurring. Mainly upper cervical facet spurring with C3-4 ankylosis. Upper chest: Mild opacity in the subpleural right apex, usually scarring. Degree of tracheomalacia. IMPRESSION: 1. No evidence of acute intracranial or cervical spine injury. Normal craniocervical alignment. 2. Atrophic brain. 3. Chronic right ear with mastoid opacification and erosion suggesting  cholesteatoma. Electronically Signed   By: Monte Fantasia M.D.   On: 06/09/2021 09:23    EKG: Independently reviewed.  Sinus rhythm at 82 bpm with RBBB and LAFB  Assessment/Plan Seizure: Acute.  Patient presents after having a fall at home witnessed having jerking of his upper and lower extremities concerning for seizure.  No reports of loss of bowel or bladder -Admit to a medical telemetry bed -Seizure precaution -Neurochecks -Check EEG and MRI wand w/o contrast per neurology recommendations -Ativan IV as needed for seizure  -Normal saline IV fluids at 75 mL/h -Allergy consultative services, we will follow-up for any further recommendations   Head contusion secondary to fall: CT of  the head and cervical spine showed no acute intercranial abnormality or cervical fracture. -PT/OT to eval and treat  History of DVT on anticoagulation: Patient was found to have a right lower extremity blood clot that was thought to be provoked after fracturing his foot during the first seizure that occurred on July 23 and being less mobile. -continue Xarelto  Cholesteatoma -Possibly recommend outpatient follow-up with ENT  Down syndrome: Patient had previously been reported to be high functioning able to complete bathing and bathing himself prior to addition of medications  OSA on CPAP -CPAP per pharmacy  DVT prophylaxis: Xarelto Code Status: Full Family Communication: Mother updated at bedside Disposition Plan: home Consults called: Neurology Admission status: Observation  Norval Morton MD [facility]   If 7PM-7AM, please contact night-coverage   06/09/2021, 11:49 AM

## 2021-06-09 NOTE — ED Provider Notes (Signed)
[facility] EMERGENCY DEPARTMENT Provider Note   CSN: [id number] Arrival date & time: 06/09/21  0813     History Chief Complaint  Patient presents with   Fall   Seizures    Joseph Williams is a 55 y.o. male with past medical history of Down syndrome, hyperlipidemia, OSA. He was transported via EMS after seizure this morning.  History is gathered by discussion with EMS at bedside.  Further information is provided from patient's mother who presented later. Patient was in his house today in the bathroom when he fell backward had a tonic-clonic seizure.  He is currently on Xarelto and suffered a hematoma to the posterior left occiput.  Patient's mother states that he had first witnessed seizure back in August.  He was placed on Keppra which "made him like a zombie."  He followed up in the outpatient setting being with Dr. Leta Baptist at [facility] neurology.  Patient was tapered off of Keppra, ended his Keppra last week and is currently on Lamictal which he is titrating upward.  Mother is concerned because he has not had any EEG.  Previous visit showed that he got a noncontrast MRI.  He has been compliant with his medications.   Fall  Seizures     Past Medical History:  Diagnosis Date   Down's syndrome    H/O echocardiogram 01/12/2012   Normal left ventricular wall thickness; Left ventricular systolic function is normal;  there is trace mitral and tricuspid regurgitation. EF 74%   High cholesterol    Persistent pneumonia     Patient Active Problem List   Diagnosis Date Noted   Seizure ([place]) 06/09/2021   OSA (obstructive sleep apnea) 02/02/2020   Pneumonia, organism unspecified(486) 12/23/2011   Down's syndrome 11/25/2011    Past Surgical History:  Procedure Laterality Date   EYE SURGERY  04/2005   HERNIA REPAIR  10/06/2004   bilateal inguinal and ventral       Family History  Problem Relation Age of Onset   Heart attack Father    Breast cancer Mother         x 3   Throat cancer Maternal Grandfather     Social History   Tobacco Use   Smoking status: Never   Smokeless tobacco: Never  Substance Use Topics   Alcohol use: No   Drug use: No    Home Medications Prior to Admission medications   Medication Sig Start Date End Date Taking? Authorizing Provider  Ascorbic Acid (VITAMIN C) 500 MG CAPS Take 1 tablet by mouth daily. Take 1 tablet by mouth once daily   Yes [provider]  aspirin 81 MG tablet Take 81 mg by mouth daily.   Yes [provider]  azithromycin (ZITHROMAX) 250 MG tablet Take 250-500 mg by mouth daily. Patient takes 500 mg for day 1. Then 250 mg every day for 4 days. Completed 06-08-21   Yes [provider]  cholecalciferol (VITAMIN D) 1000 UNITS tablet Take 1,000 Units by mouth daily.   Yes [provider]  Coenzyme Q10 300 MG CAPS Take 1 capsule by mouth daily. Take 1 capsule by mouth once daily   Yes [provider]  Cyanocobalamin (B-12) 1000 MCG CAPS Take by mouth daily.   Yes [provider]  ezetimibe (ZETIA) 10 MG tablet Take by mouth daily.   Yes [provider]  fish oil-omega-3 fatty acids 1000 MG capsule Take 1 capsule by mouth daily. Take 1 capsule by mouth once  daily   Yes [provider]  Homeopathic Products (ZINC) LOZG See admin instructions.   Yes [provider]  lamoTRIgine (LAMICTAL) 25 MG tablet Take $RemoveBef'25mg'SILkUvglrn$  daily for 2 weeks; then take $RemoveB'25mg'DLkLHsuk$  twice a day for 2 weeks; then take $RemoveB'50mg'DhFGiLdq$  twice a day for 2 weeks; then take $RemoveB'75mg'kJaJyewe$  twice a day for 2 weeks; then $RemoveBef'100mg'lrBETgYnMr$  twice a day 05/28/21  Yes Penumalli, Earlean Polka, MD  Multiple Minerals ([place]) TABS Take by mouth.   Yes [provider]  Probiotic Product (PROBIOTIC-10 PO) Take 1 capsule by mouth daily. Take 1 capsule by mouth once daily   Yes [provider]  Rivaroxaban (XARELTO) 15 MG TABS tablet Take 15 mg by mouth 2 (two) times daily with a meal. Started 06-06-21  for 3 weeks. ( On dose pack )   Yes [provider]    Allergies    Patient has no known allergies.  Review of Systems   Review of Systems  Unable to perform ROS: Mental status change   Physical Exam Updated Vital Signs BP 118/64 (BP Location: Left Arm)   Pulse 72   Temp 98.5 F (36.9 C) (Oral)   Resp 18   Ht 5' (1.524 m)   Wt 99.8 kg   SpO2 95%   BMI 42.97 kg/m   Physical Exam Vitals and nursing note reviewed.  Constitutional:      General: He is not in acute distress.    Appearance: He is well-developed. He is not diaphoretic.  HENT:     Head: Normocephalic and atraumatic.     Mouth/Throat:     Comments: Dried blood around the lips Small bite wound to tongue Eyes:     General: No scleral icterus.    Extraocular Movements: Extraocular movements intact.     Conjunctiva/sclera: Conjunctivae normal.     Pupils: Pupils are equal, round, and reactive to light.  Cardiovascular:     Rate and Rhythm: Normal rate and regular rhythm.     Heart sounds: Normal heart sounds.  Pulmonary:     Effort: Pulmonary effort is normal. No respiratory distress.     Breath sounds: Normal breath sounds.  Abdominal:     Palpations: Abdomen is soft.     Tenderness: There is no abdominal tenderness.  Musculoskeletal:     Cervical back: Normal range of motion and neck supple.  Skin:    General: Skin is warm and dry.  Neurological:     Mental Status: He is alert.  Psychiatric:        Behavior: Behavior normal.    ED Results / Procedures / Treatments   Labs (all labs ordered are listed, but only abnormal results are displayed) Labs Reviewed  COMPREHENSIVE METABOLIC PANEL - Abnormal; Notable for the following components:      Result Value   Glucose, Bld 100 (*)    Calcium 8.3 (*)    Total Protein 6.4 (*)    Albumin 2.5 (*)    All other components within normal limits  CBC WITH DIFFERENTIAL/PLATELET - Abnormal; Notable for the following components:   RBC 3.89 (*)    MCV  103.3 (*)    MCH 34.2 (*)    Lymphs Abs 0.6 (*)    All other components within normal limits  VITAMIN B12  TSH  URINALYSIS, ROUTINE W REFLEX MICROSCOPIC  CBG MONITORING, ED    EKG None  Radiology CT HEAD WO CONTRAST (5MM)  Result Date: 06/09/2021 CLINICAL DATA:  Neck trauma, mechanically unstable.  History of down syndrome EXAM: CT HEAD WITHOUT CONTRAST CT CERVICAL SPINE WITHOUT CONTRAST TECHNIQUE: Multidetector CT imaging of the head and cervical spine was performed following the standard protocol without intravenous contrast. Multiplanar CT image reconstructions of the cervical spine were also generated. COMPARISON:  10/28/2020 FINDINGS: CT HEAD FINDINGS Brain: No evidence of acute infarction, hemorrhage, hydrocephalus, extra-axial collection or mass lesion/mass effect. Vascular: No hyperdense vessel or unexpected calcification. Skull: No acute fracture. Sinuses/Orbits: Chronic left maxillary sinusitis with opacification and atelectasis. Adenoid thickening which is symmetric. Underpneumatized and opacified right mastoid air cells with imperceptible tegmen mastoideum. Adjacent scalloping of the temporal bone is symmetric and attributed to arachnoid granulations. CT CERVICAL SPINE FINDINGS Alignment: Normal Skull base and vertebrae: No acute fracture. No primary bone lesion or focal pathologic process. Soft tissues and spinal canal: No prevertebral fluid or swelling. No visible canal hematoma. Disc levels: Generalized degenerative disc space narrowing and ventral spurring. Mainly upper cervical facet spurring with C3-4 ankylosis. Upper chest: Mild opacity in the subpleural right apex, usually scarring. Degree of tracheomalacia. IMPRESSION: 1. No evidence of acute intracranial or cervical spine injury. Normal craniocervical alignment. 2. Atrophic brain. 3. Chronic right ear with mastoid opacification and erosion suggesting cholesteatoma. Electronically Signed   By: Monte Fantasia M.D.   On: 06/09/2021  09:23   CT Cervical Spine Wo Contrast  Result Date: 06/09/2021 CLINICAL DATA:  Neck trauma, mechanically unstable. History of down syndrome EXAM: CT HEAD WITHOUT CONTRAST CT CERVICAL SPINE WITHOUT CONTRAST TECHNIQUE: Multidetector CT imaging of the head and cervical spine was performed following the standard protocol without intravenous contrast. Multiplanar CT image reconstructions of the cervical spine were also generated. COMPARISON:  10/28/2020 FINDINGS: CT HEAD FINDINGS Brain: No evidence of acute infarction, hemorrhage, hydrocephalus, extra-axial collection or mass lesion/mass effect. Vascular: No hyperdense vessel or unexpected calcification. Skull: No acute fracture. Sinuses/Orbits: Chronic left maxillary sinusitis with opacification and atelectasis. Adenoid thickening which is symmetric. Underpneumatized and opacified right mastoid air cells with imperceptible tegmen mastoideum. Adjacent scalloping of the temporal bone is symmetric and attributed to arachnoid granulations. CT CERVICAL SPINE FINDINGS Alignment: Normal Skull base and vertebrae: No acute fracture. No primary bone lesion or focal pathologic process. Soft tissues and spinal canal: No prevertebral fluid or swelling. No visible canal hematoma. Disc levels: Generalized degenerative disc space narrowing and ventral spurring. Mainly upper cervical facet spurring with C3-4 ankylosis. Upper chest: Mild opacity in the subpleural right apex, usually scarring. Degree of tracheomalacia. IMPRESSION: 1. No evidence of acute intracranial or cervical spine injury. Normal craniocervical alignment. 2. Atrophic brain. 3. Chronic right ear with mastoid opacification and erosion suggesting cholesteatoma. Electronically Signed   By: Monte Fantasia M.D.   On: 06/09/2021 09:23   MR BRAIN WO CONTRAST  Result Date: 06/09/2021 CLINICAL DATA:  Seizure, abnormal neuro exam. Additional history provided: Seizure, fall (hitting back of head), small hematoma noted.  Patient on Xarelto. History of down syndrome. EXAM: MRI HEAD WITHOUT CONTRAST TECHNIQUE: Multiplanar, multiecho pulse sequences of the brain and surrounding structures were obtained without intravenous contrast. COMPARISON:  Head CT 06/09/2021. FINDINGS: Brain: Multiple sequences are significantly motion degraded, limiting evaluation. Most notably, there is severe motion degradation of the sagittal T1 weighted sequence, severe motion degradation of the axial T2 TSE sequence, severe motion degradation of the axial T2/FLAIR sequence, severe motion degradation of the axial SWI sequence, severe motion degradation of the axial T1 weighted sequences and severe motion degradation of the coronal T1 weighted sequence. Redemonstrated age advanced supratentorial and  infratentorial atrophy. The diffusion-weighted imaging is of good quality. No evidence of acute infarction. Motion degradation severely limits evaluation for focal parenchymal signal abnormalities on the remaining sequences. Mild-to-moderate chronic small-vessel ischemic changes within the cerebral white matter were better appreciated on the prior brain MRI of 05/07/2021. Within the limitations of motion degradation, no intracranial mass, chronic intracranial blood products or extra-axial fluid collection is identified. No midline shift. Vascular: Maintained proximal large arterial flow voids. Skull and upper cervical spine: Scalloping of the right temporal bone bilaterally, better appreciated on the same-day head CT and likely secondary to prominent arachnoid granulations. Please refer to the head CT performed earlier today for a description of right middle ear and mastoid findings raising suspicion for cholesteatoma. Sinuses/Orbits: Visualized orbits show no acute finding. Chronic opacification of an atelectatic left maxillary sinus. Partial opacification of the left ethmoid air cells. Other: Left posterior scalp soft tissue swelling. Left mastoid effusion.  IMPRESSION: Significantly motion degraded examination (with severe motion degradation of many sequences), as described and significantly limiting evaluation. The diffusion-weighted imaging is of good quality. No evidence of acute infarction. Moderate chronic small-vessel ischemic changes within the cerebral white matter, better appreciated on the prior brain MRI of 05/07/2021. Age-advanced supratentorial and infratentorial parenchymal atrophy. Please refer to the same-day head CT for description of right middle ear and mastoid findings suspicious for sequela of cholesteatoma. Left mastoid effusion also present. Paranasal sinus disease, as described. Left posterior scalp soft tissue swelling. Electronically Signed   By: Kellie Simmering D.O.   On: 06/09/2021 15:17   DG CHEST PORT 1 VIEW  Result Date: 06/09/2021 CLINICAL DATA:  Cough.  Follow-up pneumonia. EXAM: PORTABLE CHEST 1 VIEW COMPARISON:  Radiograph 04/27/2021 and 12/13/2013. No recent prior studies available. FINDINGS: 1335 hours. Stable cardiomegaly and prominent epicardial fat at the right cardiophrenic angle. Diffuse interstitial prominence is again noted, minimally improved from the most recent prior study of 6 weeks ago. There is no confluent airspace opacity, pleural effusion or pneumothorax. The bones appear unremarkable. Telemetry leads overlie the chest. IMPRESSION: 1. Diffuse interstitial prominence, mildly improved from most recent study, although increased from older prior studies. This suggests improving edema, although there may be a component of underlying interstitial lung disease. 2. No focal airspace disease. Electronically Signed   By: Richardean Sale M.D.   On: 06/09/2021 13:45    Procedures Procedures   Medications Ordered in ED Medications  chlorhexidine gluconate (MEDLINE KIT) (PERIDEX) 0.12 % solution 15 mL (has no administration in time range)  MEDLINE mouth rinse (15 mLs Mouth Rinse Given 06/09/21 1524)  0.9 %  sodium chloride  infusion (has no administration in time range)  albuterol (PROVENTIL) (2.5 MG/3ML) 0.083% nebulizer solution 2.5 mg (has no administration in time range)  LORazepam (ATIVAN) injection 1-2 mg (has no administration in time range)  acetaminophen (TYLENOL) tablet 650 mg (has no administration in time range)    Or  acetaminophen (TYLENOL) suppository 650 mg (has no administration in time range)  ezetimibe (ZETIA) tablet 10 mg (has no administration in time range)  Rivaroxaban (XARELTO) tablet 15 mg (has no administration in time range)    Followed by  rivaroxaban (XARELTO) tablet 20 mg (has no administration in time range)  lamoTRIgine (LAMICTAL) tablet 25 mg (has no administration in time range)    Followed by  lamoTRIgine (LAMICTAL) tablet 25 mg (has no administration in time range)    Followed by  lamoTRIgine (LAMICTAL) tablet 50 mg (has no administration in time range)  Followed by  lamoTRIgine (LAMICTAL) tablet 75 mg (has no administration in time range)    Followed by  lamoTRIgine (LAMICTAL) tablet 100 mg (has no administration in time range)  acetaminophen (TYLENOL) tablet 650 mg (650 mg Oral Given 06/09/21 1031)    ED Course  I have reviewed the triage vital signs and the nursing notes.  Pertinent labs & imaging results that were available during my care of the patient were reviewed by me and considered in my medical decision making (see chart for details).    MDM Rules/Calculators/A&P                           55 year old male with a history of Down syndrome.  Patient with second seizure today.The differential diagnosis for includes but is not limited to idiopathic seizure, traumatic brain injury, intracranial hemorrhage, vascular lesion, mass or space containing lesion, degenerative neurologic disease, congenital brain abnormality, infectious etiology such as meningitis, encephalitis or abscess, metabolic disturbance including hyper or hypoglycemia, hyper or hyponatremia,  hyperosmolar state, uremia, hepatic failure, hypocalcemia, hypomagnesemia.  Toxic substances such as cocaine, lidocaine, antidepressants, theophylline, alcohol withdrawal, drug withdrawal, eclampsia, hypertensive encephalopathy and anoxic brain injury. I ordered and reviewed labs that included CBC which shows macrocytosis without anemia, CMP with mildly elevated glucose, mildly low calcium of insignificant value.  I ordered and reviewed a CT head and C-spine without contrast which showed no acute abnormalities.  I ordered and reviewed a 1 view chest x-ray which shows diffuse interstitial prominence improved from previous chest x-ray concerning for either edema versus interstitial lung disease.  Patient does not appear to be volume overloaded and is able to tolerate lying back without hypoxia.  EKG shows sinus rhythm at a rate of 82 without signs of ischemia.  Case discussed with Dr. Su Monks who recommends admission, contrasted MRI and EEG.  I have also discussed the case with Dr. Fuller Plan who will admit the patient to the hospitalist service. Final Clinical Impression(s) / ED Diagnoses Final diagnoses:  Seizure ([place])    Rx / DC Orders ED Discharge Orders          Ordered    Ambulatory referral to Neurology       Comments: An appointment is requested in approximately: 1-2 weeks. Pt is already established with Penumalli.   06/09/21 Crowder, [place], PA-C 45/91/36 [id number]    Lianne Cure, DO 23/41/44 [phone]

## 2021-06-09 NOTE — Procedures (Signed)
Patient Name: Joseph Williams  MRN: [id number]  Epilepsy Attending: Lora Havens  Referring Physician/Provider: Dr Su Monks Date: 06/09/2021 Duration: 29.12 mins  Patient history: 55 yo male with history of seizures who had a breakthrough seizure at home today. EEG to evaluate for seizure.  Level of alertness: Awake  AEDs during EEG study: LTG  Technical aspects: This EEG study was done with scalp electrodes positioned according to the 10-20 International system of electrode placement. Electrical activity was acquired at a sampling rate of '500Hz'$  and reviewed with a high frequency filter of '70Hz'$  and a low frequency filter of '1Hz'$ . EEG data were recorded continuously and digitally stored.   Description: No posterior dominant rhythm was seen. EEG showed continuous generalized 3 to 6 Hz theta-delta slowing. Hyperventilation and photic stimulation were not performed.     ABNORMALITY - Continuous slow, generalized  IMPRESSION: This study is suggestive of moderate diffuse encephalopathy, nonspecific etiology. No seizures or epileptiform discharges were seen throughout the recording.  [name]

## 2021-06-09 NOTE — ED Triage Notes (Signed)
Pt BIB EMS from home with c/o Seizure, fell and hit back of the head, small hematoma noted. Mother found pt in the bathroom. Pt on Xarelto for blood clot to leg. H/O seizure and Down's syndrome. Per EMS, pt at baseline orientation, VSS. Unknown name of seizure med as per mom, it was just recently changed.

## 2021-06-09 NOTE — Consult Note (Addendum)
Neurology Consultation  Reason for Consult: seizure like activity.  Referring Physician: Harvest Forest, MD.   CC: seizure like activity.   History is obtained from: chart, mother at bedside.   HPI: Joseph Williams is a 55 yo male with Down syndrome and is highly functional. His PMHx includes OSA, seizures, DVT on Xarelto, and HDL. Today, mother states patient was in the bathroom alone and called out for her. When she went in, he was having GTC activity and was unresponsive for 5-6 minutes. When EMS arrived, patient was starting to come around. Was sleepy afterward. No incontinence of tongue biting noted.   Mother reports Joseph Williams saw his PCP for a cough and was diagnosed with "chronic pneumonia".  He is on Day 4/5 if Zpack.   Mother states patient is very active at home. He feeds himself, can toilet alone, walks without assistance, plays basketball in the back yard, has a job at [employer], and enjoys sports on TV. 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   In review of chart, Joseph Williams was seen in the ED 05/07/21 after collapsing at home with witnessed seizure activity. That was his first seizure. He was put on Keppra and f/up with neurology as outpatient. Mother said he did terrible on Keppra. Medicine made him a "zombie" and he basically quit doing things for himself, or going outside, ect. His last out patient note in neuro, states:       "Patient has had some mild memory problems that have progressed over the past few months.  At baseline he has limited verbal output mainly answering yes and no questions.  He does follow commands.  Otherwise he does stay fairly active at home at baseline."  "05/28/21 Start lamotrigine titration dose.   lamoTRIgine (LAMICTAL) 25 MG tablet      Sig: Take '25mg'$  daily for 2 weeks; then take '25mg'$  twice a day for 2 weeks; then take '50mg'$  twice a day for 2 weeks; then take '75mg'$  twice a day for 2 weeks; then '100mg'$  twice a day   Reduce levetiracetam to '250mg'$  daily x 1 week and then stop."  Joseph Williams is now off Keppra and on Lamictal '25mg'$  po qd.   Neurology asked to consult for seizure today.   ROS: A robust ROS was performed and is negative except as noted in the HPI.   Past Medical History:  Diagnosis Date   Down's syndrome    H/O echocardiogram 01/12/2012   Normal left ventricular wall thickness; Left ventricular systolic function is normal;  there is trace mitral and tricuspid regurgitation. EF 74%   High cholesterol    Persistent pneumonia   Seizure disorder   Family History  Problem Relation Age of Onset   Heart attack Father    Breast cancer Mother        x 3   Throat cancer Maternal Grandfather    Social History:   reports that he has never smoked. He has never used smokeless tobacco. He reports that he does not drink alcohol and does not use drugs.  Medications No current facility-administered medications for this encounter.  Current Outpatient Medications:    Ascorbic Acid (VITAMIN C) 500 MG CAPS, Take 1 tablet by mouth daily. Take 1 tablet by mouth once daily, Disp: , Rfl:    aspirin 81 MG tablet, Take 81 mg by mouth daily., Disp: , Rfl:    azithromycin (ZITHROMAX) 250 MG tablet, Take 250-500 mg  by mouth daily. Patient takes 500 mg for day 1. Then 250 mg every day for 4 days. Completed 06-08-21, Disp: , Rfl:    cholecalciferol (VITAMIN D) 1000 UNITS tablet, Take 1,000 Units by mouth daily., Disp: , Rfl:    Coenzyme Q10 300 MG CAPS, Take 1 capsule by mouth daily. Take 1 capsule by mouth once daily, Disp: , Rfl:    Cyanocobalamin (B-12) 1000 MCG CAPS, Take by mouth daily., Disp: , Rfl:    ezetimibe (ZETIA) 10 MG tablet, Take by mouth daily., Disp: , Rfl:    fish oil-omega-3 fatty acids 1000 MG capsule, Take 1 capsule by mouth daily. Take 1 capsule by mouth once daily, Disp: , Rfl:    Homeopathic Products (ZINC) LOZG, See admin instructions., Disp: , Rfl:    lamoTRIgine (LAMICTAL) 25 MG tablet,  Take '25mg'$  daily for 2 weeks; then take '25mg'$  twice a day for 2 weeks; then take '50mg'$  twice a day for 2 weeks; then take '75mg'$  twice a day for 2 weeks; then '100mg'$  twice a day, Disp: 240 tablet, Rfl: 3   Multiple Minerals (CALCIUM-MAGNESIUM-ZINC) TABS, Take by mouth., Disp: , Rfl:    Probiotic Product (PROBIOTIC-10 PO), Take 1 capsule by mouth daily. Take 1 capsule by mouth once daily, Disp: , Rfl:    Rivaroxaban (XARELTO) 15 MG TABS tablet, Take 15 mg by mouth 2 (two) times daily with a meal. Started 06-06-21 for 3 weeks. ( On dose pack ), Disp: , Rfl:    DUREZOL 0.05 % EMUL, Apply 1 drop to eye 4 (four) times daily. (Patient not taking: Reported on 06/09/2021), Disp: , Rfl:    ketorolac (ACULAR) 0.5 % ophthalmic solution, 1 drop 4 (four) times daily. (Patient not taking: Reported on 06/09/2021), Disp: , Rfl:   Exam: Current vital signs: BP 124/74   Pulse 86   Temp 97.8 F (36.6 C) (Oral)   Resp 18   Ht 5' (1.524 m)   Wt 99.8 kg   SpO2 99%   BMI 42.97 kg/m  Vital signs in last 24 hours: Temp:  [97.8 F (36.6 C)] 97.8 F (36.6 C) (09/04 0818) Pulse Rate:  [80-86] 86 (09/04 1000) Resp:  [17-18] 18 (09/04 1000) BP: (114-124)/(72-74) 124/74 (09/04 1000) SpO2:  [95 %-99 %] 99 % (09/04 1000) Weight:  [99.8 kg] 99.8 kg (09/04 0818)  PE: GENERAL: Well appearing male with Down syndrome lying in ED bed. NAD. HEENT: - Normocephalic and atraumatic, moist mucous membranes. LUNGS - Normal respiratory effort.  CV - RRR. ABDOMEN - Soft, nontender. Ext: warm, well perfused. RLE pink.  Psych: Affect appropriate to situation. Calm, friendly.   NEURO:  Mental Status: Awake, alert, and oriented to name, place, mother. Unable to tell NP why he is here, month, year, day or date. Follows simple commands.  Speech/Language: speech is consistent with Down syndrome speech. He speaks in 1-2 word phrases. He perseverates sometimes on answers to questions asked prior. Fluency and comprehension intact. Cranial  Nerves:  II: PERRL 3 mm/brisk. Tracks NP.  III, IV, VI: EOMI. Lid elevation symmetric and full.  V: sensation is intact and symmetrical to face.  VII: Smile is symmetrical.   VIII:hearing intact to voice. IX, X: palate elevation is symmetric. Phonation normal.  XI: normal sternocleidomastoid and trapezius muscle strength. [id number] is symmetrical without fasciculations.   Motor:  Is able to purposefully and spontaneously move all 4 extremities. Can lift his arms and legs off bed. Able to lift LLE higher than RLE due  to recent DVT.  Sensation- Intact to light touch bilaterally in all four extremities.  Coordination: No drift. Unable to perform FNF and HKS due to understanding issues.  Cerebellar: No tremor, clonus. Gait: deferred  CBC    Component Value Date/Time   WBC 4.2 06/09/2021 0900   RBC 3.89 (L) 06/09/2021 0900   HGB 13.3 06/09/2021 0900   HCT 40.2 06/09/2021 0900   PLT 221 06/09/2021 0900   MCV 103.3 (H) 06/09/2021 0900   MCV 99.0 (A) 12/11/2011 1556   MCH 34.2 (H) 06/09/2021 0900   MCHC 33.1 06/09/2021 0900   RDW 13.9 06/09/2021 0900   LYMPHSABS 0.6 (L) 06/09/2021 0900   MONOABS 0.7 06/09/2021 0900   EOSABS 0.0 06/09/2021 0900   BASOSABS 0.1 06/09/2021 0900   CMP     Component Value Date/Time   NA 139 06/09/2021 0900   K 3.7 06/09/2021 0900   CL 107 06/09/2021 0900   CO2 26 06/09/2021 0900   GLUCOSE 100 (H) 06/09/2021 0900   BUN 9 06/09/2021 0900   CREATININE 0.98 06/09/2021 0900   CREATININE 1.04 12/11/2011 1530   CALCIUM 8.3 (L) 06/09/2021 0900   PROT 6.4 (L) 06/09/2021 0900   ALBUMIN 2.5 (L) 06/09/2021 0900   AST 25 06/09/2021 0900   ALT 18 06/09/2021 0900   ALKPHOS 78 06/09/2021 0900   BILITOT 0.4 06/09/2021 0900   GFRNONAA >60 06/09/2021 0900   Imaging  CTH and Cspine:  No evidence of acute intracranial or cervical spine injury. Normal craniocervical alignment. Atrophic brain. Chronic right ear with mastoid opacification and erosion suggesting  cholesteatoma.  EEG This study is suggestive of moderate diffuse encephalopathy, nonspecific etiology. No seizures or epileptiform discharges were seen throughout the recording.   Assessment: 55 yo male with history of seizures who had a breakthrough seizure at home today. His medicine regimen was just adjusted by out patient neurology. No missed doses. Given his illness just this past week, NP believes this to be a provoked seizure due to respiratory infection. His Lamictal would not be therapeutic as of yet.   Impression: -Provoked, breakthrough seizure.  -Down Syndrome, high functioning.   Recommendations: -Continue Lamictal '25mg'$  po qd and follow titration schedule per out patient  neurology as charted in HPI.  - Will load with Vimpat '200mg'$  IV once and start PO Vimpat '100mg'$  BID with end date of 07/23/21 as a bridge until Lamictal titration is completed. -Ativan 1-'2mg'$  IV prn seizure lasting over 5 minutes.  -Seizure precautions.  -recheck CXR showed no infection.  -UA with reflex to micro an culture as needed.  -Check TSH, B12.  -Replete B12 if level not over 500.  -Continue Xarelto for recently diagnosed DVT.  -f/up with out patient neurology 1-2 weeks after discharge. Referral made.   Pt seen by Clance Boll, NP/Neuro and later by MD. Note/plan to be edited by MD as needed.  Pager: [phone]  NEUROHOSPITALIST ADDENDUM Performed a face to face diagnostic evaluation.   I have reviewed the contents of history and physical exam as documented by PA/ARNP/Resident and agree with above documentation.  I have discussed and formulated the above plan as documented. Edits to the note have been made as needed.  Impression/Key exam findings/Plan: 30M with hx of seizures who was on keppra but was not tolerating it well and was switched to lamictal. Had a seizure while lamictal was being uptitrated vs from a potential respiratoyr infection. Will bridge with Vimpat ans stop Vimpat on  07/23/21 when Lamictal reaches  $'100mg'j$  BID. Can follow up with outpatient Neurologist. Rest of the plan as mentioned above.  Neurology will signoff  Donnetta Simpers, MD [facility] [phone]   If 7pm to 7am, please call on call as listed on AMION.

## 2021-06-09 NOTE — ED Notes (Signed)
Pt just got back from MRI, ER tech now taking pt to floor.

## 2021-06-09 NOTE — ED Notes (Signed)
Per pt's mom pt has a short neck and will not keep C collar on. Per mom, pt will remain still and not move.

## 2021-06-10 DIAGNOSIS — H7190 Unspecified cholesteatoma, unspecified ear: Secondary | ICD-10-CM | POA: Diagnosis not present

## 2021-06-10 DIAGNOSIS — G4733 Obstructive sleep apnea (adult) (pediatric): Secondary | ICD-10-CM | POA: Diagnosis not present

## 2021-06-10 DIAGNOSIS — Z86718 Personal history of other venous thrombosis and embolism: Secondary | ICD-10-CM | POA: Diagnosis not present

## 2021-06-10 DIAGNOSIS — U071 COVID-19: Secondary | ICD-10-CM | POA: Diagnosis not present

## 2021-06-10 DIAGNOSIS — Q909 Down syndrome, unspecified: Secondary | ICD-10-CM | POA: Diagnosis not present

## 2021-06-10 DIAGNOSIS — R569 Unspecified convulsions: Secondary | ICD-10-CM | POA: Diagnosis not present

## 2021-06-10 LAB — RENAL FUNCTION PANEL
Albumin: 2.5 g/dL — ABNORMAL LOW (ref 3.5–5.0)
Anion gap: 6 (ref 5–15)
BUN: 7 mg/dL (ref 6–20)
CO2: 26 mmol/L (ref 22–32)
Calcium: 8.4 mg/dL — ABNORMAL LOW (ref 8.9–10.3)
Chloride: 105 mmol/L (ref 98–111)
Creatinine, Ser: 0.92 mg/dL (ref 0.61–1.24)
GFR, Estimated: >60 mL/min (ref >60)
Glucose, Bld: 101 mg/dL — ABNORMAL HIGH (ref 70–99)
Phosphorus: 3.6 mg/dL (ref 2.5–4.6)
Potassium: 3.9 mmol/L (ref 3.5–5.1)
Sodium: 137 mmol/L (ref 135–145)

## 2021-06-10 LAB — CBC WITH DIFFERENTIAL/PLATELET
Abs Immature Granulocytes: 0.01 10*3/uL (ref 0.00–0.07)
Basophils Absolute: 0.1 10*3/uL (ref 0.0–0.1)
Basophils Relative: 2 %
Eosinophils Absolute: 0 10*3/uL (ref 0.0–0.5)
Eosinophils Relative: 1 %
HCT: 39.2 % (ref 39.0–52.0)
Hemoglobin: 13.2 g/dL (ref 13.0–17.0)
Immature Granulocytes: 0 %
Lymphocytes Relative: 17 %
Lymphs Abs: 0.7 10*3/uL (ref 0.7–4.0)
MCH: 33.8 pg (ref 26.0–34.0)
MCHC: 33.7 g/dL (ref 30.0–36.0)
MCV: 100.3 fL — ABNORMAL HIGH (ref 80.0–100.0)
Monocytes Absolute: 0.6 10*3/uL (ref 0.1–1.0)
Monocytes Relative: 15 %
Neutro Abs: 2.7 10*3/uL (ref 1.7–7.7)
Neutrophils Relative %: 65 %
Platelets: 275 10*3/uL (ref 150–400)
RBC: 3.91 MIL/uL — ABNORMAL LOW (ref 4.22–5.81)
RDW: 13.4 % (ref 11.5–15.5)
WBC: 4.1 10*3/uL (ref 4.0–10.5)
nRBC: 0 % (ref 0.0–0.2)

## 2021-06-10 LAB — D-DIMER, QUANTITATIVE: D-Dimer, Quant: 6.62 ug/mL-FEU — ABNORMAL HIGH (ref 0.00–0.50)

## 2021-06-10 LAB — URINALYSIS, COMPLETE (UACMP) WITH MICROSCOPIC
Bacteria, UA: NONE SEEN
Bilirubin Urine: NEGATIVE
Glucose, UA: NEGATIVE mg/dL
Hgb urine dipstick: NEGATIVE
Ketones, ur: NEGATIVE mg/dL
Leukocytes,Ua: NEGATIVE
Nitrite: NEGATIVE
Protein, ur: NEGATIVE mg/dL
Specific Gravity, Urine: 1.005 (ref 1.005–1.030)
pH: 7 (ref 5.0–8.0)

## 2021-06-10 LAB — RESP PANEL BY RT-PCR (FLU A&B, COVID) ARPGX2
Influenza A by PCR: NEGATIVE
Influenza B by PCR: NEGATIVE
SARS Coronavirus 2 by RT PCR: POSITIVE — AB

## 2021-06-10 LAB — BRAIN NATRIURETIC PEPTIDE: B Natriuretic Peptide: 38.6 pg/mL (ref 0.0–100.0)

## 2021-06-10 LAB — MAGNESIUM: Magnesium: 2.2 mg/dL (ref 1.7–2.4)

## 2021-06-10 LAB — FERRITIN: Ferritin: 160 ng/mL (ref 24–336)

## 2021-06-10 LAB — PROCALCITONIN: Procalcitonin: <0.1 ng/mL

## 2021-06-10 LAB — HIV ANTIBODY (ROUTINE TESTING W REFLEX): HIV Screen 4th Generation wRfx: NONREACTIVE

## 2021-06-10 LAB — C-REACTIVE PROTEIN: CRP: 0.9 mg/dL (ref <1.0)

## 2021-06-10 MED ORDER — HYDROCOD POLST-CPM POLST ER 10-8 MG/5ML PO SUER: 5.0000 mL | Freq: Two times a day (BID) | ORAL | Status: DC | PRN

## 2021-06-10 MED ORDER — IPRATROPIUM-ALBUTEROL 20-100 MCG/ACT IN AERS
1.0000 | INHALATION_SPRAY | Freq: Four times a day (QID) | RESPIRATORY_TRACT | Status: DC
  Filled 2021-06-10: qty 4

## 2021-06-10 MED ORDER — ZINC SULFATE 220 (50 ZN) MG PO CAPS
220.0000 mg | ORAL_CAPSULE | Freq: Every day | ORAL | Status: DC
  Administered 2021-06-10: 220 mg via ORAL
  Filled 2021-06-10 (×2): qty 1

## 2021-06-10 MED ORDER — GUAIFENESIN ER 600 MG PO TB12
1200.0000 mg | ORAL_TABLET | Freq: Two times a day (BID) | ORAL | Status: DC
  Administered 2021-06-10 (×2): 1200 mg via ORAL
  Filled 2021-06-10 (×3): qty 2

## 2021-06-10 MED ORDER — SODIUM CHLORIDE 0.9 % IV SOLN: 200.0000 mg | Freq: Once | INTRAVENOUS | Status: DC

## 2021-06-10 MED ORDER — LORAZEPAM 2 MG/ML IJ SOLN
0.5000 mg | Freq: Every day | INTRAMUSCULAR | Status: DC | PRN
  Filled 2021-06-10: qty 1

## 2021-06-10 MED ORDER — SODIUM CHLORIDE 0.9 % IV SOLN: 100.0000 mg | Freq: Every day | INTRAVENOUS | Status: DC

## 2021-06-10 MED ORDER — ASCORBIC ACID 500 MG PO TABS
500.0000 mg | ORAL_TABLET | Freq: Every day | ORAL | Status: DC
  Administered 2021-06-10: 500 mg via ORAL
  Filled 2021-06-10 (×2): qty 1

## 2021-06-10 MED ORDER — GUAIFENESIN-DM 100-10 MG/5ML PO SYRP: 10.0000 mL | ORAL_SOLUTION | ORAL | Status: DC | PRN

## 2021-06-10 MED ORDER — CYANOCOBALAMIN 1000 MCG/ML IJ SOLN
1000.0000 ug | Freq: Every day | INTRAMUSCULAR | Status: DC
  Administered 2021-06-10: 1000 ug via SUBCUTANEOUS
  Filled 2021-06-10 (×2): qty 1

## 2021-06-10 NOTE — Progress Notes (Signed)
Pt sister called in nurse into room. Stated pt pulled his IV out and was trying to smack her. Pt seems upset at sister. Mittens were placed, IV order placed and MD notified. Sister believed the Joseph Williams is what makes the pt aggressive

## 2021-06-10 NOTE — Evaluation (Signed)
Occupational Therapy Evaluation Patient Details Name: Joseph Williams MRN: [id number] DOB: 17-May-1966 Today's Date: 06/10/2021    History of Present Illness 55 y.o. male admitted 9/4 with seizure. EEG findings revealed moderate diffuse encephalopathy, nonspecific etiology. PMH: ED on 8/2 after witnessed collapse and seizure-like activity. 7/23 fall/syncope with Rt 1st toe and 2nd metatarsal fx, Down syndrome, HLD, OSA, and DVT.  Covid+ 9/5   Clinical Impression   Patient admitted for the diagnosis above.  Recent toe fracture during prior seizure, but per the mother, he is generally independent with some oversight at home with ADL and mobility.  Mild unsteadiness, but very lethargic which is impacting independence.  Mother's plan is to return home when cleared medically.  She is not interested in [facility] services, as she expects he will return to baseline.  OT to sign off.      Follow Up Recommendations  Supervision/Assistance - 24 hour    Equipment Recommendations  None recommended by OT    Recommendations for Other Services       Precautions / Restrictions Precautions Precautions: Fall Precaution Comments: seizures, aspiration Restrictions Weight Bearing Restrictions: No      Mobility Bed Mobility               General bed mobility comments: In recliner upon arrival. Patient Response: Flat affect  Transfers Overall transfer level: Needs assistance   Transfers: Sit to/from Stand Sit to Stand: Min guard         General transfer comment: patient able to walk to the door and back to the window... very lethargic.    Balance Overall balance assessment: Needs assistance Sitting-balance support: Feet supported Sitting balance-Leahy Scale: Fair     Standing balance support: Bilateral upper extremity supported;During functional activity Standing balance-Leahy Scale: Fair                             ADL either performed or assessed with clinical judgement    ADL Overall ADL's : At baseline                                       General ADL Comments: Patient will have assist as needed at home.     Vision Baseline Vision/History: 1 Wears glasses Patient Visual Report: No change from baseline       Perception  NT   Praxis  NT    Pertinent Vitals/Pain Pain Assessment: No/denies pain     Hand Dominance Right   Extremity/Trunk Assessment Upper Extremity Assessment Upper Extremity Assessment: Overall WFL for tasks assessed   Lower Extremity Assessment Lower Extremity Assessment: Defer to PT evaluation   Cervical / Trunk Assessment Cervical / Trunk Assessment: Normal   Communication Communication Communication: Expressive difficulties   Cognition Arousal/Alertness: Lethargic Behavior During Therapy: WFL for tasks assessed/performed Overall Cognitive Status: Impaired/Different from baseline                         Following Commands: Follows one step commands inconsistently Safety/Judgement: Decreased awareness of safety;Decreased awareness of deficits     General Comments: Mother believes new seizure med is causing him to be less alert and more sleepy.   General Comments       Exercises     Shoulder Instructions      Home Living Family/patient expects to be discharged to::  Private residence Living Arrangements: Parent Available Help at Discharge: Family Type of Home: House Home Access: Stairs to enter Technical brewer of Steps: 3 Entrance Stairs-Rails: Left Home Layout: Two level;Other (Comment)     Bathroom Shower/Tub: Walk-in shower;Tub/shower unit   Bathroom Toilet: Handicapped height     Home Equipment: Walker - 2 wheels          Prior Functioning/Environment Level of Independence: Independent with assistive device(s)        Comments: Patient had just started using the RW.  Occasional assist with hygiene s/p BM, but generally independent with ADL.  Mother  assists with IADL, meds, and community mobility.        OT Problem List: Impaired balance (sitting and/or standing)      OT Treatment/Interventions:      OT Goals(Current goals can be found in the care plan section) Acute Rehab OT Goals Patient Stated Goal: Sit at my desk OT Goal Formulation: With patient Time For Goal Achievement: 06/10/21 Potential to Achieve Goals: Good  OT Frequency:     Barriers to D/C:  None noted          Co-evaluation              AM-PAC OT "6 Clicks" Daily Activity     Outcome Measure Help from another person eating meals?: None Help from another person taking care of personal grooming?: None Help from another person toileting, which includes using toliet, bedpan, or urinal?: A Little Help from another person bathing (including washing, rinsing, drying)?: A Little Help from another person to put on and taking off regular upper body clothing?: A Little Help from another person to put on and taking off regular lower body clothing?: A Little 6 Click Score: 20   End of Session Equipment Utilized During Treatment: Gait belt;Rolling walker Nurse Communication: Mobility status  Activity Tolerance: Patient tolerated treatment well Patient left: in chair;with call bell/phone within reach;with family/visitor present  OT Visit Diagnosis: Unsteadiness on feet (R26.81)                Time: [id number] OT Time Calculation (min): 18 min Charges:  OT General Charges $OT Visit: 1 Visit OT Evaluation $OT Eval Moderate Complexity: 1 Mod  06/10/2021  RP, OTR/L  Acute Rehabilitation Services  Office:  [phone]   Metta Clines 06/10/2021, 2:06 PM

## 2021-06-10 NOTE — Progress Notes (Signed)
PROGRESS NOTE    Joseph Williams  WER:[id number] DOB: 1965/11/16 DOA: 06/09/2021 PCP: Willey Blade, MD    Chief Complaint  Patient presents with   Fall   Seizures    Brief Narrative:  Patient 55 year old gentleman with history of Down syndrome, highly functional, OSA, seizures, recent diagnosis of DVT on Xarelto, presenting to the ED with general tonic convulsions/seizures.  Neurology consulted and patient maintained on home regimen Lamictal, Vimpat added to regiment.  COVID-19 PCR done positive.   Assessment & Plan:   Principal Problem:   Seizure ([place]) Active Problems:   Down's syndrome   OSA (obstructive sleep apnea)   Cholesteatoma   History of DVT (deep vein thrombosis)   COVID-19 virus infection   #1 breakthrough seizures -Patient presented with breakthrough seizures. -It is noted that patient's antiseizure regimen has been adjusted in the outpatient setting.  Patient with no missed doses. -Patient also noted to have recently received a course of antibiotics for presumed pneumonia in the outpatient setting. -Patient noted to have been started on Lamictal with dose being uptitrated and felt likely not therapeutic which may have contributed to patient's breakthrough seizures. -CT head, MRI head with no acute abnormalities noted. -EEG with moderate diffuse encephalopathy, nonspecific etiology, no seizures or epileptiform discharges were seen throughout the recording -TSH within normal limits. -Vitamin B12 at 300. -Vitamin B12 1000 MCG subcu daily while in-house and transition to oral vitamin B12 supplementation on discharge with goal to keep vitamin B12 > 500. -Patient placed back on home regimen Lamictal and Vimpat added to regimen. -Patient received a loading dose of Vimpat 200 mg x 1 and started on oral Vimpat 100 mg twice daily with end date of 07/23/2021 to bridge patient until Lamictal titration was completed per neurology recommendations. -Patient with no  further seizures noted at this time. -Monitor over the next 24 hours and if patient tolerating antiseizure regimen, no further seizures noted and in stable condition could discharge home with close outpatient follow-up with neurology 1 to 2 weeks post discharge.  2.  COVID-19 infection -Screening COVID-19 PCR noted to be positive. -Patient not hypoxic. -Patient noted to have recently been treated for presumed pneumonia in the outpatient setting with a course of azithromycin per family. -Unable to place on Paxlovid as patient on Xarelto. -Check inflammatory markers of CRP, ferritin, D-dimer, procalcitonin. -Placed on vitamin C, zinc, Combivent. -No indication for treatment at this time. -Quarantine for total of 10 days. -Supportive care.  3.  OSA -CPAP nightly.  4.  history of DVT -Patient noted to have had a right lower extremity DVT thought to be provoked after fracturing his foot joint for seizure April 27, 2021 and when he was less mobile. -Continue Xarelto.  5.  Cholesteatoma -Outpatient follow-up. -May need referral to ENT.  6.  Head contusion secondary to fall -No acute abnormalities noted on head CT. -PT/OT.  7.  Down syndrome -Patient noted to have reported previously in the past to be high functioning and able to complete ADLs prior to addition of medications.   DVT prophylaxis: Xarelto Code Status: Full Family Communication: Updated mother and sister at bedside. Disposition:   Status is: Observation  The patient remains OBS appropriate and will d/c before 2 midnights.  Dispo: The patient is from: Home              Anticipated d/c is to: Home              Patient currently is not medically  stable to d/c.   Difficult to place patient No       Consultants:  Neurology: Dr. Lorrin Goodell 06/09/2021  Procedures:  CT head CT C-spine 06/09/2021 Chest x-ray 06/09/2021 MRI brain 06/09/2021 EEG 06/09/2021  Antimicrobials:  None   Subjective: Patient sitting up in  chair.  Mother at bedside.  No chest pain.  No significant shortness of breath.  Nonproductive cough per family.  Patient with no further seizures noted.  Family hopeful to be discharged soon as they feel patient will do better in the more familiar surroundings.  Objective: Vitals:   06/09/21 1400 06/09/21 1532 06/09/21 1945 06/10/21 0428  BP:  118/64 119/64 122/67  Pulse: 74 72 90   Resp: '13 18 18   ' Temp:  98.5 F (36.9 C) 98.1 F (36.7 C) 98.3 F (36.8 C)  TempSrc:  Oral Oral Oral  SpO2: 98% 95% 96%   Weight:      Height:        Intake/Output Summary (Last 24 hours) at 06/10/2021 1050 Last data filed at 06/09/2021 1657 Gross per 24 hour  Intake 0 ml  Output --  Net 0 ml   Filed Weights   06/09/21 0818  Weight: 99.8 kg    Examination:  General exam: Appears calm and comfortable  Respiratory system: Some scattered coarse breath sounds.  No wheezing.  Fair air movement.   Cardiovascular system: S1 & S2 heard, RRR. No JVD, murmurs, rubs, gallops or clicks. No pedal edema. Gastrointestinal system: Abdomen is nondistended, soft and nontender. No organomegaly or masses felt. Normal bowel sounds heard. Central nervous system: Alert. No focal neurological deficits. Extremities: Symmetric 5 x 5 power. Skin: No rashes, lesions or ulcers Psychiatry: Judgement and insight appear poor to fair. Mood & affect appropriate.     Data Reviewed: I have personally reviewed following labs and imaging studies  CBC: Recent Labs  Lab 06/09/21 0900 06/10/21 0914  WBC 4.2 4.1  NEUTROABS 2.8 2.7  HGB 13.3 13.2  HCT 40.2 39.2  MCV 103.3* 100.3*  PLT 221 937    Basic Metabolic Panel: Recent Labs  Lab 06/09/21 0900 06/10/21 0914  NA 139 137  K 3.7 3.9  CL 107 105  CO2 26 26  GLUCOSE 100* 101*  BUN 9 7  CREATININE 0.98 0.92  CALCIUM 8.3* 8.4*  MG  --  2.2  PHOS  --  3.6    GFR: Estimated Creatinine Clearance: 92.9 mL/min (by C-G formula based on SCr of 0.92 mg/dL).  Liver  Function Tests: Recent Labs  Lab 06/09/21 0900 06/10/21 0914  AST 25  --   ALT 18  --   ALKPHOS 78  --   BILITOT 0.4  --   PROT 6.4*  --   ALBUMIN 2.5* 2.5*    CBG: Recent Labs  Lab 06/09/21 0921  GLUCAP 92     Recent Results (from the past 240 hour(s))  Resp Panel by RT-PCR (Flu A&B, Covid) Nasopharyngeal Swab     Status: Abnormal   Collection Time: 06/10/21  9:20 AM   Specimen: Nasopharyngeal Swab; Nasopharyngeal(NP) swabs in vial transport medium  Result Value Ref Range Status   SARS Coronavirus 2 by RT PCR POSITIVE (A) NEGATIVE Final    Comment: RESULT CALLED TO, READ BACK BY AND VERIFIED WITH: RN Y.MATOS ON [id number] AT 1021 BY E.PARRISH (NOTE) SARS-CoV-2 target nucleic acids are DETECTED.  The SARS-CoV-2 RNA is generally detectable in upper respiratory specimens during the acute phase of infection. Positive results  are indicative of the presence of the identified virus, but do not rule out bacterial infection or co-infection with other pathogens not detected by the test. Clinical correlation with patient history and other diagnostic information is necessary to determine patient infection status. The expected result is Negative.  Fact Sheet for Patients: EntrepreneurPulse.com.au  Fact Sheet for Healthcare Providers: IncredibleEmployment.be  This test is not yet approved or cleared by the [place] FDA and  has been authorized for detection and/or diagnosis of SARS-CoV-2 by FDA under an Emergency Use Authorization (EUA).  This EUA will remain in effect (meaning this t est can be used) for the duration of  the COVID-19 declaration under Section 564(b)(1) of the Act, 21 U.S.C. section 360bbb-3(b)(1), unless the authorization is terminated or revoked sooner.     Influenza A by PCR NEGATIVE NEGATIVE Final   Influenza B by PCR NEGATIVE NEGATIVE Final    Comment: (NOTE) The Xpert Xpress SARS-CoV-2/FLU/RSV plus assay is  intended as an aid in the diagnosis of influenza from Nasopharyngeal swab specimens and should not be used as a sole basis for treatment. Nasal washings and aspirates are unacceptable for Xpert Xpress SARS-CoV-2/FLU/RSV testing.  Fact Sheet for Patients: EntrepreneurPulse.com.au  Fact Sheet for Healthcare Providers: IncredibleEmployment.be  This test is not yet approved or cleared by the [place] FDA and has been authorized for detection and/or diagnosis of SARS-CoV-2 by FDA under an Emergency Use Authorization (EUA). This EUA will remain in effect (meaning this test can be used) for the duration of the COVID-19 declaration under Section 564(b)(1) of the Act, 21 U.S.C. section 360bbb-3(b)(1), unless the authorization is terminated or revoked.  Performed at [facility], [place] [address] [place], [place] [address]          Radiology Studies: CT HEAD WO CONTRAST (5MM)  Result Date: 06/09/2021 CLINICAL DATA:  Neck trauma, mechanically unstable. History of down syndrome EXAM: CT HEAD WITHOUT CONTRAST CT CERVICAL SPINE WITHOUT CONTRAST TECHNIQUE: Multidetector CT imaging of the head and cervical spine was performed following the standard protocol without intravenous contrast. Multiplanar CT image reconstructions of the cervical spine were also generated. COMPARISON:  10/28/2020 FINDINGS: CT HEAD FINDINGS Brain: No evidence of acute infarction, hemorrhage, hydrocephalus, extra-axial collection or mass lesion/mass effect. Vascular: No hyperdense vessel or unexpected calcification. Skull: No acute fracture. Sinuses/Orbits: Chronic left maxillary sinusitis with opacification and atelectasis. Adenoid thickening which is symmetric. Underpneumatized and opacified right mastoid air cells with imperceptible tegmen mastoideum. Adjacent scalloping of the temporal bone is symmetric and attributed to arachnoid granulations. CT CERVICAL SPINE FINDINGS  Alignment: Normal Skull base and vertebrae: No acute fracture. No primary bone lesion or focal pathologic process. Soft tissues and spinal canal: No prevertebral fluid or swelling. No visible canal hematoma. Disc levels: Generalized degenerative disc space narrowing and ventral spurring. Mainly upper cervical facet spurring with C3-4 ankylosis. Upper chest: Mild opacity in the subpleural right apex, usually scarring. Degree of tracheomalacia. IMPRESSION: 1. No evidence of acute intracranial or cervical spine injury. Normal craniocervical alignment. 2. Atrophic brain. 3. Chronic right ear with mastoid opacification and erosion suggesting cholesteatoma. Electronically Signed   By: Monte Fantasia M.D.   On: 06/09/2021 09:23   CT Cervical Spine Wo Contrast  Result Date: 06/09/2021 CLINICAL DATA:  Neck trauma, mechanically unstable. History of down syndrome EXAM: CT HEAD WITHOUT CONTRAST CT CERVICAL SPINE WITHOUT CONTRAST TECHNIQUE: Multidetector CT imaging of the head and cervical spine was performed following the standard protocol without intravenous contrast. Multiplanar CT  image reconstructions of the cervical spine were also generated. COMPARISON:  10/28/2020 FINDINGS: CT HEAD FINDINGS Brain: No evidence of acute infarction, hemorrhage, hydrocephalus, extra-axial collection or mass lesion/mass effect. Vascular: No hyperdense vessel or unexpected calcification. Skull: No acute fracture. Sinuses/Orbits: Chronic left maxillary sinusitis with opacification and atelectasis. Adenoid thickening which is symmetric. Underpneumatized and opacified right mastoid air cells with imperceptible tegmen mastoideum. Adjacent scalloping of the temporal bone is symmetric and attributed to arachnoid granulations. CT CERVICAL SPINE FINDINGS Alignment: Normal Skull base and vertebrae: No acute fracture. No primary bone lesion or focal pathologic process. Soft tissues and spinal canal: No prevertebral fluid or swelling. No visible  canal hematoma. Disc levels: Generalized degenerative disc space narrowing and ventral spurring. Mainly upper cervical facet spurring with C3-4 ankylosis. Upper chest: Mild opacity in the subpleural right apex, usually scarring. Degree of tracheomalacia. IMPRESSION: 1. No evidence of acute intracranial or cervical spine injury. Normal craniocervical alignment. 2. Atrophic brain. 3. Chronic right ear with mastoid opacification and erosion suggesting cholesteatoma. Electronically Signed   By: Monte Fantasia M.D.   On: 06/09/2021 09:23   MR BRAIN WO CONTRAST  Result Date: 06/09/2021 CLINICAL DATA:  Seizure, abnormal neuro exam. Additional history provided: Seizure, fall (hitting back of head), small hematoma noted. Patient on Xarelto. History of down syndrome. EXAM: MRI HEAD WITHOUT CONTRAST TECHNIQUE: Multiplanar, multiecho pulse sequences of the brain and surrounding structures were obtained without intravenous contrast. COMPARISON:  Head CT 06/09/2021. FINDINGS: Brain: Multiple sequences are significantly motion degraded, limiting evaluation. Most notably, there is severe motion degradation of the sagittal T1 weighted sequence, severe motion degradation of the axial T2 TSE sequence, severe motion degradation of the axial T2/FLAIR sequence, severe motion degradation of the axial SWI sequence, severe motion degradation of the axial T1 weighted sequences and severe motion degradation of the coronal T1 weighted sequence. Redemonstrated age advanced supratentorial and infratentorial atrophy. The diffusion-weighted imaging is of good quality. No evidence of acute infarction. Motion degradation severely limits evaluation for focal parenchymal signal abnormalities on the remaining sequences. Mild-to-moderate chronic small-vessel ischemic changes within the cerebral white matter were better appreciated on the prior brain MRI of 05/07/2021. Within the limitations of motion degradation, no intracranial mass, chronic  intracranial blood products or extra-axial fluid collection is identified. No midline shift. Vascular: Maintained proximal large arterial flow voids. Skull and upper cervical spine: Scalloping of the right temporal bone bilaterally, better appreciated on the same-day head CT and likely secondary to prominent arachnoid granulations. Please refer to the head CT performed earlier today for a description of right middle ear and mastoid findings raising suspicion for cholesteatoma. Sinuses/Orbits: Visualized orbits show no acute finding. Chronic opacification of an atelectatic left maxillary sinus. Partial opacification of the left ethmoid air cells. Other: Left posterior scalp soft tissue swelling. Left mastoid effusion. IMPRESSION: Significantly motion degraded examination (with severe motion degradation of many sequences), as described and significantly limiting evaluation. The diffusion-weighted imaging is of good quality. No evidence of acute infarction. Moderate chronic small-vessel ischemic changes within the cerebral white matter, better appreciated on the prior brain MRI of 05/07/2021. Age-advanced supratentorial and infratentorial parenchymal atrophy. Please refer to the same-day head CT for description of right middle ear and mastoid findings suspicious for sequela of cholesteatoma. Left mastoid effusion also present. Paranasal sinus disease, as described. Left posterior scalp soft tissue swelling. Electronically Signed   By: Kellie Simmering D.O.   On: 06/09/2021 15:17   DG CHEST PORT 1 VIEW  Result Date: 06/09/2021  CLINICAL DATA:  Cough.  Follow-up pneumonia. EXAM: PORTABLE CHEST 1 VIEW COMPARISON:  Radiograph 04/27/2021 and 12/13/2013. No recent prior studies available. FINDINGS: 1335 hours. Stable cardiomegaly and prominent epicardial fat at the right cardiophrenic angle. Diffuse interstitial prominence is again noted, minimally improved from the most recent prior study of 6 weeks ago. There is no confluent  airspace opacity, pleural effusion or pneumothorax. The bones appear unremarkable. Telemetry leads overlie the chest. IMPRESSION: 1. Diffuse interstitial prominence, mildly improved from most recent study, although increased from older prior studies. This suggests improving edema, although there may be a component of underlying interstitial lung disease. 2. No focal airspace disease. Electronically Signed   By: Richardean Sale M.D.   On: 06/09/2021 13:45   EEG adult  Result Date: 06/09/2021 Lora Havens, MD     06/09/2021  8:05 PM Patient Name: GIANG HEMME MRN: [id number] Epilepsy Attending: Lora Havens Referring Physician/Provider: Dr Su Monks Date: 06/09/2021 Duration: 29.12 mins Patient history: 55 yo male with history of seizures who had a breakthrough seizure at home today. EEG to evaluate for seizure. Level of alertness: Awake AEDs during EEG study: LTG Technical aspects: This EEG study was done with scalp electrodes positioned according to the 10-20 International system of electrode placement. Electrical activity was acquired at a sampling rate of '500Hz'  and reviewed with a high frequency filter of '70Hz'  and a low frequency filter of '1Hz' . EEG data were recorded continuously and digitally stored. Description: No posterior dominant rhythm was seen. EEG showed continuous generalized 3 to 6 Hz theta-delta slowing. Hyperventilation and photic stimulation were not performed.   ABNORMALITY - Continuous slow, generalized IMPRESSION: This study is suggestive of moderate diffuse encephalopathy, nonspecific etiology. No seizures or epileptiform discharges were seen throughout the recording. Priyanka Barbra Sarks        Scheduled Meds:  vitamin C  500 mg Oral Daily   chlorhexidine gluconate (MEDLINE KIT)  15 mL Mouth Rinse BID   cyanocobalamin  1,000 mcg Subcutaneous Daily   ezetimibe  10 mg Oral Daily   Ipratropium-Albuterol  1 puff Inhalation Q6H   lacosamide  100 mg Oral BID   [START ON  06/11/2021] lamoTRIgine  25 mg Oral BID   Followed by   Derrill Memo ON 06/25/2021] lamoTRIgine  50 mg Oral BID   Followed by   Derrill Memo ON 07/09/2021] lamoTRIgine  75 mg Oral BID   Followed by   Derrill Memo ON 07/23/2021] lamoTRIgine  100 mg Oral BID   mouth rinse  15 mL Mouth Rinse 10 times per day   Rivaroxaban  15 mg Oral BID WC   Followed by   Derrill Memo ON 06/26/2021] rivaroxaban  20 mg Oral Q supper   zinc sulfate  220 mg Oral Daily   Continuous Infusions:  remdesivir 200 mg in sodium chloride 0.9% 250 mL IVPB     Followed by   Derrill Memo ON 06/11/2021] remdesivir 100 mg in NS 100 mL       LOS: 0 days    Time spent: 40 minutes.    Irine Seal, MD [facility]   To contact the attending provider between 7A-7P or the covering provider during after hours 7P-7A, please log into the web site [email or web address] and access using universal [place] password for that web site. If you do not have the password, please call the hospital operator.  06/10/2021, 10:50 AM

## 2021-06-10 NOTE — Progress Notes (Signed)
Orthopedic Tech Progress Note Patient Details:  Joseph Williams 05/16/1966 [id number]  Went to service patient with SOFT COLLAR, THERAPY was working with patient at the moment. Mom said " she didn't want patient to have it" .. notified RN   Patient ID: Joseph Williams, male   DOB: [birth year], 55 y.o.   MRN: [id number]  [name] 06/10/2021, 8:51 AM

## 2021-06-10 NOTE — Care Management Obs Status (Signed)
MEDICARE OBSERVATION STATUS NOTIFICATION   Patient Details  Name: Joseph Williams MRN: [id number] Date of Birth: 1966-07-02   Medicare Observation Status Notification Given:  Yes    Angelita Ingles, RN 06/10/2021, 11:16 AM

## 2021-06-10 NOTE — Evaluation (Signed)
Physical Therapy Evaluation Patient Details Name: Joseph Williams MRN: [id number] DOB: October 26, 1965 Today's Date: 06/10/2021   History of Present Illness  55 y.o. male admitted 9/4 with seizure. EEG findings revealed moderate diffuse encephalopathy, nonspecific etiology. PMH: ED on 8/2 after witnessed collapse and seizure-like activity. 7/23 fall/syncope with Rt 1st toe and 2nd metatarsal fx, Down syndrome, HLD, OSA, and DVT  Clinical Impression  Pt tolerated treatment well with VSS on RA. Pt ambulated in hall with min A, requiring constant verbal cueing to maintain hips in RW. Pt's mother mentioned pt has frequent falls. Deficits include decreased balance, strength, mobility, and safety awareness. Pt would benefit from PT to improve mentioned deficits and function. Recommend HHPT upon d/c, as pt has support at home and would benefit from PT in home environment.     Follow Up Recommendations Home health PT    Equipment Recommendations  None recommended by PT    Recommendations for Other Services       Precautions / Restrictions Precautions Precautions: Fall Precaution Comments: seizures, aspiration Restrictions Weight Bearing Restrictions: No      Mobility  Bed Mobility               General bed mobility comments: In recliner upon arrival. Pt donned sock on L foot independently.    Transfers Overall transfer level: Needs assistance Equipment used: None Transfers: Sit to/from Stand Sit to Stand: Min guard         General transfer comment: Min guard for safety, as pt falls frequently. Put pt's shoes on due to R foot fx 7/23. Pt's mother stated pt does not wear CAM boot and does not have makeshift boot with them currently. Verbal cues provided for hand placement.  Ambulation/Gait Ambulation/Gait assistance: Min assist Gait Distance (Feet): 150 Feet Assistive device: Rolling walker (2 wheeled) Gait Pattern/deviations: Step-through pattern   Gait velocity  interpretation: >2.62 ft/sec, indicative of community ambulatory General Gait Details: Pt demonstrated step-through pattern with constant verbal cues provided to maintain hips in RW. Min A to guide/navigate RW during ambulation.  Stairs            Wheelchair Mobility    Modified Rankin (Stroke Patients Only)       Balance Overall balance assessment: Needs assistance         Standing balance support: Bilateral upper extremity supported;During functional activity Standing balance-Leahy Scale: Fair Standing balance comment: Able to stand without RW, but requires BUE support during ambulation                             Pertinent Vitals/Pain Pain Assessment: No/denies pain    Home Living Family/patient expects to be discharged to:: Private residence Living Arrangements: Parent Available Help at Discharge: Family Type of Home: House Home Access: Stairs to enter Entrance Stairs-Rails: Left Entrance Stairs-Number of Steps: 3 Home Layout: Two level;Other (Comment) Home Equipment: Walker - 2 wheels      Prior Function Level of Independence: Independent with assistive device(s)         Comments: Has used walker for a couple of weeks after foot fx, but has been independent up until hospital admission     Hand Dominance        Extremity/Trunk Assessment   Upper Extremity Assessment Upper Extremity Assessment: Defer to OT evaluation    Lower Extremity Assessment Lower Extremity Assessment: Generalized weakness    Cervical / Trunk Assessment Cervical / Trunk Assessment: Normal  Communication      Cognition Arousal/Alertness: Awake/alert Behavior During Therapy: WFL for tasks assessed/performed Overall Cognitive Status: Impaired/Different from baseline Area of Impairment: Following commands;Safety/judgement;Attention                   Current Attention Level: Focused   Following Commands: Follows one step commands  inconsistently Safety/Judgement: Decreased awareness of safety     General Comments: Pt's mother said he was being "funny today" due to not sleeping which is out of the ordinary for pt. Consistent verbal cues provided. Following one step commands inconsistently with sustained attention by messing with leads and gait belt frequently. Demonstrated decreased safety awareness given pulling out RW in front and constantly taking hands off RW.      General Comments General comments (skin integrity, edema, etc.): VSS on RA.    Exercises     Assessment/Plan    PT Assessment Patient needs continued PT services  PT Problem List Decreased balance;Decreased activity tolerance;Decreased mobility;Decreased knowledge of use of DME;Decreased strength;Decreased safety awareness       PT Treatment Interventions Gait training;DME instruction;Functional mobility training;Therapeutic activities;Therapeutic exercise;Balance training    PT Goals (Current goals can be found in the Care Plan section)  Acute Rehab PT Goals Patient Stated Goal: Go back to work PT Goal Formulation: With patient/family Time For Goal Achievement: 06/24/21 Potential to Achieve Goals: Good    Frequency Min 3X/week   Barriers to discharge        Co-evaluation               AM-PAC PT "6 Clicks" Mobility  Outcome Measure Help needed turning from your back to your side while in a flat bed without using bedrails?: A Little Help needed moving from lying on your back to sitting on the side of a flat bed without using bedrails?: A Little Help needed moving to and from a bed to a chair (including a wheelchair)?: A Little Help needed standing up from a chair using your arms (e.g., wheelchair or bedside chair)?: A Little Help needed to walk in hospital room?: A Little Help needed climbing 3-5 steps with a railing? : A Lot 6 Click Score: 17    End of Session Equipment Utilized During Treatment: Gait belt Activity  Tolerance: Patient tolerated treatment well Patient left: with call bell/phone within reach;in chair;with chair alarm set;with family/visitor present Nurse Communication: Mobility status PT Visit Diagnosis: Unsteadiness on feet (R26.81);Muscle weakness (generalized) (M62.81);Other abnormalities of gait and mobility (R26.89)    Time: [id number] PT Time Calculation (min) (ACUTE ONLY): 32 min   Charges:   PT Evaluation $PT Eval Moderate Complexity: 1 Mod PT Treatments $Gait Training: 8-22 mins        Louie Casa, SPT Acute Rehab: [phone] [id number]    Domingo Dimes 06/10/2021, 10:02 AM

## 2021-06-10 NOTE — Plan of Care (Signed)
  Problem: Elimination: Goal: Will not experience complications related to bowel motility Outcome: Progressing   Problem: Safety: Goal: Ability to remain free from injury will improve Outcome: Progressing   Problem: Skin Integrity: Goal: Risk for impaired skin integrity will decrease Outcome: Progressing   

## 2021-06-11 DIAGNOSIS — U071 COVID-19: Secondary | ICD-10-CM | POA: Diagnosis not present

## 2021-06-11 DIAGNOSIS — Q909 Down syndrome, unspecified: Secondary | ICD-10-CM | POA: Diagnosis not present

## 2021-06-11 DIAGNOSIS — R569 Unspecified convulsions: Secondary | ICD-10-CM | POA: Diagnosis not present

## 2021-06-11 DIAGNOSIS — H7190 Unspecified cholesteatoma, unspecified ear: Secondary | ICD-10-CM | POA: Diagnosis not present

## 2021-06-11 DIAGNOSIS — Z86718 Personal history of other venous thrombosis and embolism: Secondary | ICD-10-CM | POA: Diagnosis not present

## 2021-06-11 LAB — CBC WITH DIFFERENTIAL/PLATELET
Abs Immature Granulocytes: 0.01 10*3/uL (ref 0.00–0.07)
Basophils Absolute: 0.1 10*3/uL (ref 0.0–0.1)
Basophils Relative: 1 %
Eosinophils Absolute: 0 10*3/uL (ref 0.0–0.5)
Eosinophils Relative: 1 %
HCT: 38.5 % — ABNORMAL LOW (ref 39.0–52.0)
Hemoglobin: 13 g/dL (ref 13.0–17.0)
Immature Granulocytes: 0 %
Lymphocytes Relative: 21 %
Lymphs Abs: 0.7 10*3/uL (ref 0.7–4.0)
MCH: 33.6 pg (ref 26.0–34.0)
MCHC: 33.8 g/dL (ref 30.0–36.0)
MCV: 99.5 fL (ref 80.0–100.0)
Monocytes Absolute: 0.5 10*3/uL (ref 0.1–1.0)
Monocytes Relative: 15 %
Neutro Abs: 2.2 10*3/uL (ref 1.7–7.7)
Neutrophils Relative %: 62 %
Platelets: 267 10*3/uL (ref 150–400)
RBC: 3.87 MIL/uL — ABNORMAL LOW (ref 4.22–5.81)
RDW: 13.6 % (ref 11.5–15.5)
WBC: 3.5 10*3/uL — ABNORMAL LOW (ref 4.0–10.5)
nRBC: 0 % (ref 0.0–0.2)

## 2021-06-11 LAB — COMPREHENSIVE METABOLIC PANEL
ALT: 17 U/L (ref 0–44)
AST: 22 U/L (ref 15–41)
Albumin: 2.5 g/dL — ABNORMAL LOW (ref 3.5–5.0)
Alkaline Phosphatase: 76 U/L (ref 38–126)
Anion gap: 6 (ref 5–15)
BUN: 8 mg/dL (ref 6–20)
CO2: 26 mmol/L (ref 22–32)
Calcium: 8.2 mg/dL — ABNORMAL LOW (ref 8.9–10.3)
Chloride: 106 mmol/L (ref 98–111)
Creatinine, Ser: 0.85 mg/dL (ref 0.61–1.24)
GFR, Estimated: >60 mL/min (ref >60)
Glucose, Bld: 100 mg/dL — ABNORMAL HIGH (ref 70–99)
Potassium: 3.5 mmol/L (ref 3.5–5.1)
Sodium: 138 mmol/L (ref 135–145)
Total Bilirubin: 0.6 mg/dL (ref 0.3–1.2)
Total Protein: 6.1 g/dL — ABNORMAL LOW (ref 6.5–8.1)

## 2021-06-11 LAB — D-DIMER, QUANTITATIVE: D-Dimer, Quant: 6.32 ug/mL-FEU — ABNORMAL HIGH (ref 0.00–0.50)

## 2021-06-11 LAB — C-REACTIVE PROTEIN: CRP: 0.9 mg/dL (ref <1.0)

## 2021-06-11 LAB — FERRITIN: Ferritin: 145 ng/mL (ref 24–336)

## 2021-06-11 LAB — PHOSPHORUS: Phosphorus: 4.4 mg/dL (ref 2.5–4.6)

## 2021-06-11 LAB — MAGNESIUM: Magnesium: 2.3 mg/dL (ref 1.7–2.4)

## 2021-06-11 MED ORDER — IPRATROPIUM-ALBUTEROL 20-100 MCG/ACT IN AERS: 1.0000 | INHALATION_SPRAY | Freq: Four times a day (QID) | RESPIRATORY_TRACT | 0 refills | Status: DC | PRN

## 2021-06-11 MED ORDER — GUAIFENESIN ER 600 MG PO TB12: 1200.0000 mg | ORAL_TABLET | Freq: Two times a day (BID) | ORAL | 0 refills | Status: AC

## 2021-06-11 MED ORDER — IPRATROPIUM-ALBUTEROL 20-100 MCG/ACT IN AERS: 1.0000 | INHALATION_SPRAY | Freq: Four times a day (QID) | RESPIRATORY_TRACT | Status: DC | PRN

## 2021-06-11 MED ORDER — POTASSIUM CHLORIDE CRYS ER 20 MEQ PO TBCR
20.0000 meq | EXTENDED_RELEASE_TABLET | Freq: Once | ORAL | Status: DC
  Filled 2021-06-11: qty 1

## 2021-06-11 MED ORDER — LACOSAMIDE 100 MG PO TABS: 100.0000 mg | ORAL_TABLET | Freq: Two times a day (BID) | ORAL | 0 refills | Status: DC

## 2021-06-11 NOTE — Discharge Summary (Signed)
Physician Discharge Summary  Joseph Williams [id number] DOB: June 13, 1966 DOA: 06/09/2021  PCP: Willey Blade, MD  Admit date: 06/09/2021 Discharge date: 06/11/2021  Time spent: 50 minutes  Recommendations for Outpatient Follow-up:  Follow-up with Dr. Leta Baptist, neurology in 2 weeks. Follow-up with Willey Blade, MD in 2 weeks.  On follow-up patient will need a basic metabolic profile and magnesium level done to follow-up on electrolytes and renal function.  COVID-19 infection also need to be followed up upon.    Discharge Diagnoses:  Principal Problem:   Seizure [facility]) Active Problems:   Down's syndrome   OSA (obstructive sleep apnea)   Cholesteatoma   History of DVT (deep vein thrombosis)   COVID-19 virus infection   Discharge Condition: Stable and improved  Diet recommendation: Regular  Filed Weights   06/09/21 0818  Weight: 99.8 kg    History of present illness:  HPI per Dr. Jaci Standard is a 55 y.o. male with medical history significant of Down syndrome (high functioning), hyperlipidemia, DVT on Xarelto, and seizure presents after having witnessed seizure this morning.  His mother helps provide medical history as patient has Down syndrome.  At baseline he had been previously fairly independent with cleaning, toileting, feeding himself, and was able to walk without assistance.  Around 6:45 AM this morning he had gotten up and gone to the bathroom and his mother had assisted him.  As he seemed to be doing okay she left at that time, but heard him say "awwh" before hearing him fall.  When she came into the bathroom she saw jerking of his upper and lower extremities that lasted seconds to a minute.  Denied any loss of bowel or bladder.  He did hit his left shoulder and the back of his head.   Patient has seemed to decline after he had eye surgery on July 6 and July 20 requiring anesthesia.  She does not note distressed, but he previously had no issues prior.  He  had been seen in the emergency department on 7/23 after having a fall, but at that time no seizure-like activity was witnessed.  He was admitted on 8/2 after having a fall and was witnessed having rhythmic shaking of his legs with concern for postictal phase after resolution of symptoms.  He also was found to have fractures of the second metatarsal and base of the first distal phalanx.  At that time MRI did not note any acute abnormalities and he was recommended to start on Keppra 500 mg twice daily and follow-up with neurology in the outpatient setting.  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  His mother states that the medication made him a completely different person and was no longer able to laugh and reportedly was having hallucinations stations.  They followed up with neurology who recommended decreasing dose to 250 mg twice daily.  Despite the decreased dose the patient was still having hallucinations, had lost about 12 pounds in weight, having difficulty feeding himself, and needing assistance with walking.  Thereafter it was recommended to use taper off of Keppra which had been done and he was started on Lamictal 25 mg daily.  Patient is supposed to start Lamictal 25 mg twice daily next week.  His mother states that the patient seemed to be getting back to his normal self here lately.  Since fracturing his foot he had been in a makeshift boot and follow-up  with orthopedics.  Patient had developed a productive cough that was keeping him up overnight and last week he had been started on azithromycin for possible upper respiratory infection/pneumonia.  He just recently had developed swelling of his right lower extremity had a Doppler ultrasound which revealed a DVT.  Patient had been started on Xarelto.    ED Course: Upon admission to the emergency department patient was seen to have stable vital signs.  CT scan  of the head and cervical spine did not show any signs of a intracranial or cervical spine injury atrophic brain with chronic right ear opacification and erosion suggesting cholesteatoma.  Labs significant for albumin 2.5.  Neurology recommended obtaining EEG and MRI with and without contrast of the brain.  [facility] called to admit.   Hospital Course:  #1 breakthrough seizures -Patient presented with breakthrough seizures. -It is noted that patient's antiseizure regimen has been adjusted in the outpatient setting.  Patient with no missed doses. -Patient also noted to have recently received a course of antibiotics for presumed pneumonia in the outpatient setting. -Patient noted to have been started on Lamictal with dose being uptitrated and felt likely not therapeutic which may have contributed to patient's breakthrough seizures. -CT head, MRI head with no acute abnormalities noted. -EEG with moderate diffuse encephalopathy, nonspecific etiology, no seizures or epileptiform discharges were seen throughout the recording -TSH within normal limits. -Vitamin B12 at 300. -Vitamin B12 1000 MCG subcu daily while in-house and resumed on home regimen of oral vitamin B12 supplementation on discharge with goal to keep vitamin B12 > 500. -Patient placed back on home regimen Lamictal and Vimpat added to regimen. -Patient received a loading dose of Vimpat 200 mg x 1 and started on oral Vimpat 100 mg twice daily with end date of 07/23/2021 to bridge patient until Lamictal titration was completed per neurology recommendations. -Patient with no further seizures noted during the hospitalization.   -Patient was discharged home in stable and improved condition.   -Outpatient follow-up with primary neurologist 2 weeks post discharge.    2.  COVID-19 infection -Screening COVID-19 PCR noted to be positive. -Patient not hypoxic. -Patient noted to have recently been treated for presumed pneumonia in the outpatient setting  with a course of azithromycin per family. -Unable to place on Paxlovid as patient on Xarelto and due to drug interactions.. -Inflammatory markers of CRP, ferritin, procalcitonin was not elevated.   -Patient was placed on vitamin C, zinc, Combivent.   -Patient remained asymptomatic.   -Patient was discharged home in stable condition and has been advised to quarantine for 8 more days.   -Outpatient follow-up with PCP.    3.  OSA -CPAP nightly.  4.  history of DVT -Patient noted to have had a right lower extremity DVT thought to be provoked after fracturing his foot joint for seizure April 27, 2021 and when he was less mobile. -Patient maintained on home regimen Xarelto.   5.  Cholesteatoma -Outpatient follow-up. -May need referral to ENT.  6.  Head contusion secondary to fall -No acute abnormalities noted on head CT. -Patient seen by PT who recommended home health therapies have it is noted per [facility] that family refused.  7.  Down syndrome -Patient noted to have reported previously in the past to be high functioning and able to complete ADLs prior to addition of medications. -Outpatient follow-up with PCP.      Procedures: CT head CT C-spine 06/09/2021 Chest x-ray 06/09/2021 MRI brain 06/09/2021 EEG 06/09/2021  Consultations:  Neurology: Dr. Lorrin Goodell 06/09/2021    Discharge Exam: Vitals:   06/11/21 0346 06/11/21 0500  BP: (!) 96/56 100/68  Pulse:    Resp:    Temp: 97.6 F (36.4 C)   SpO2: 95%     General: NAD Cardiovascular: RRR no murmurs rubs or gallops.  No JVD.  No lower extremity edema. Respiratory: Clear to auscultation anterior lung fields.  No wheezes, no crackles, no rhonchi.  Discharge Instructions   Discharge Instructions     Ambulatory referral to Neurology   Complete by: As directed    An appointment is requested in approximately: 1-2 weeks. Pt is already established with [facility].   Diet general   Complete by: As directed    Discharge instructions    Complete by: As directed    Please quarantine for 8 more days.  ?   Person Under Monitoring Name: WAGNER KEOMANY  Location: [id number] [place] [address]   Infection Prevention Recommendations for Individuals Confirmed to have, or Being Evaluated for, 2019 Novel Coronavirus (COVID-19) Infection Who Receive Care at Home  Individuals who are confirmed to have, or are being evaluated for, COVID-19 should follow the prevention steps below until a healthcare provider or local or state health department says they can return to normal activities.  Stay home except to get medical care You should restrict activities outside your home, except for getting medical care. Do not go to work, school, or public areas, and do not use public transportation or taxis.  Call ahead before visiting your doctor Before your medical appointment, call the healthcare provider and tell them that you have, or are being evaluated for, COVID-19 infection. This will help the healthcare provider's office take steps to keep other people from getting infected. Ask your healthcare provider to call the local or state health department.  Monitor your symptoms Seek prompt medical attention if your illness is worsening (e.g., difficulty breathing). Before going to your medical appointment, call the healthcare provider and tell them that you have, or are being evaluated for, COVID-19 infection. Ask your healthcare provider to call the local or state health department.  Wear a facemask You should wear a facemask that covers your nose and mouth when you are in the same room with other people and when you visit a healthcare provider. People who live with or visit you should also wear a facemask while they are in the same room with you.  Separate yourself from other people in your home As much as possible, you should stay in a different room from other people in your home. Also, you should use a  separate bathroom, if available.  Avoid sharing household items You should not share dishes, drinking glasses, cups, eating utensils, towels, bedding, or other items with other people in your home. After using these items, you should wash them thoroughly with soap and water.  Cover your coughs and sneezes Cover your mouth and nose with a tissue when you cough or sneeze, or you can cough or sneeze into your sleeve. Throw used tissues in a lined trash can, and immediately wash your hands with soap and water for at least 20 seconds or use an alcohol-based hand rub.  Wash your [facility] your hands often and thoroughly with soap and water for at least 20 seconds. You can use an alcohol-based hand sanitizer if soap and water are not available and if your hands are not visibly dirty. Avoid touching your eyes, nose, and mouth with  unwashed hands.   Prevention Steps for Caregivers and Household Members of Individuals Confirmed to have, or Being Evaluated for, COVID-19 Infection Being Cared for in the Home  If you live with, or provide care at home for, a person confirmed to have, or being evaluated for, COVID-19 infection please follow these guidelines to prevent infection:  Follow healthcare provider's instructions Make sure that you understand and can help the patient follow any healthcare provider instructions for all care.  Provide for the patient's basic needs You should help the patient with basic needs in the home and provide support for getting groceries, prescriptions, and other personal needs.  Monitor the patient's symptoms If they are getting sicker, call his or her medical provider and tell them that the patient has, or is being evaluated for, COVID-19 infection. This will help the healthcare provider's office take steps to keep other people from getting infected. Ask the healthcare provider to call the local or state health department.  Limit the number of people who have  contact with the patient If possible, have only one caregiver for the patient. Other household members should stay in another home or place of residence. If this is not possible, they should stay in another room, or be separated from the patient as much as possible. Use a separate bathroom, if available. Restrict visitors who do not have an essential need to be in the home.  Keep older adults, very young children, and other sick people away from the patient Keep older adults, very young children, and those who have compromised immune systems or chronic health conditions away from the patient. This includes people with chronic heart, lung, or kidney conditions, diabetes, and cancer.  Ensure good ventilation Make sure that shared spaces in the home have good air flow, such as from an air conditioner or an opened window, weather permitting.  Wash your hands often Wash your hands often and thoroughly with soap and water for at least 20 seconds. You can use an alcohol based hand sanitizer if soap and water are not available and if your hands are not visibly dirty. Avoid touching your eyes, nose, and mouth with unwashed hands. Use disposable paper towels to dry your hands. If not available, use dedicated cloth towels and replace them when they become wet.  Wear a facemask and gloves Wear a disposable facemask at all times in the room and gloves when you touch or have contact with the patient's blood, body fluids, and/or secretions or excretions, such as sweat, saliva, sputum, nasal mucus, vomit, urine, or feces.  Ensure the mask fits over your nose and mouth tightly, and do not touch it during use. Throw out disposable facemasks and gloves after using them. Do not reuse. Wash your hands immediately after removing your facemask and gloves. If your personal clothing becomes contaminated, carefully remove clothing and launder. Wash your hands after handling contaminated clothing. Place all used  disposable facemasks, gloves, and other waste in a lined container before disposing them with other household waste. Remove gloves and wash your hands immediately after handling these items.  Do not share dishes, glasses, or other household items with the patient Avoid sharing household items. You should not share dishes, drinking glasses, cups, eating utensils, towels, bedding, or other items with a patient who is confirmed to have, or being evaluated for, COVID-19 infection. After the person uses these items, you should wash them thoroughly with soap and water.  Wash laundry thoroughly Immediately remove and wash clothes or  bedding that have blood, body fluids, and/or secretions or excretions, such as sweat, saliva, sputum, nasal mucus, vomit, urine, or feces, on them. Wear gloves when handling laundry from the patient. Read and follow directions on labels of laundry or clothing items and detergent. In general, wash and dry with the warmest temperatures recommended on the label.  Clean all areas the individual has used often Clean all touchable surfaces, such as counters, tabletops, doorknobs, bathroom fixtures, toilets, phones, keyboards, tablets, and bedside tables, every day. Also, clean any surfaces that may have blood, body fluids, and/or secretions or excretions on them. Wear gloves when cleaning surfaces the patient has come in contact with. Use a diluted bleach solution (e.g., dilute bleach with 1 part bleach and 10 parts water) or a household disinfectant with a label that says EPA-registered for coronaviruses. To make a bleach solution at home, add 1 tablespoon of bleach to 1 quart (4 cups) of water. For a larger supply, add  cup of bleach to 1 gallon (16 cups) of water. Read labels of cleaning products and follow recommendations provided on product labels. Labels contain instructions for safe and effective use of the cleaning product including precautions you should take when applying  the product, such as wearing gloves or eye protection and making sure you have good ventilation during use of the product. Remove gloves and wash hands immediately after cleaning.  Monitor yourself for signs and symptoms of illness Caregivers and household members are considered close contacts, should monitor their health, and will be asked to limit movement outside of the home to the extent possible. Follow the monitoring steps for close contacts listed on the symptom monitoring form.   ? If you have additional questions, contact your local health department or call the epidemiologist on call at [phone] (available 24/7). ? This guidance is subject to change. For the most up-to-date guidance from [facility], please refer to their website: YouBlogs.pl   Increase activity slowly   Complete by: As directed       Allergies as of 06/11/2021   No Known Allergies      Medication List     STOP taking these medications    azithromycin 250 MG tablet Commonly known as: ZITHROMAX       TAKE these medications    aspirin 81 MG tablet Take 81 mg by mouth daily.   B-12 1000 MCG Caps Take by mouth daily.   Calcium-Magnesium-Zinc Tabs Take by mouth.   cholecalciferol 1000 units tablet Commonly known as: VITAMIN D Take 1,000 Units by mouth daily.   Coenzyme Q10 300 MG Caps Take 1 capsule by mouth daily. Take 1 capsule by mouth once daily   ezetimibe 10 MG tablet Commonly known as: ZETIA Take by mouth daily.   fish oil-omega-3 fatty acids 1000 MG capsule Take 1 capsule by mouth daily. Take 1 capsule by mouth once daily   guaiFENesin 600 MG 12 hr tablet Commonly known as: MUCINEX Take 2 tablets (1,200 mg total) by mouth 2 (two) times daily for 5 days.   Ipratropium-Albuterol 20-100 MCG/ACT Aers respimat Commonly known as: COMBIVENT Inhale 1 puff into the lungs every 6 (six) hours as needed for wheezing.    Lacosamide 100 MG Tabs Take 1 tablet (100 mg total) by mouth 2 (two) times daily.   lamoTRIgine 25 MG tablet Commonly known as: LAMICTAL Take '25mg'$  daily for 2 weeks; then take '25mg'$  twice a day for 2 weeks; then take '50mg'$  twice a day for 2 weeks; then  take '75mg'$  twice a day for 2 weeks; then '100mg'$  twice a day   PROBIOTIC-10 PO Take 1 capsule by mouth daily. Take 1 capsule by mouth once daily   Rivaroxaban 15 MG Tabs tablet Commonly known as: XARELTO Take 15 mg by mouth 2 (two) times daily with a meal. Started 06-06-21 for 3 weeks. ( On dose pack )   Vitamin C 500 MG Caps Take 1 tablet by mouth daily. Take 1 tablet by mouth once daily   Zinc Lozg See admin instructions.       No Known Allergies  Follow-up Information     Penumalli, Earlean Polka, MD. Schedule an appointment as soon as possible for a visit in 2 week(s).   Specialties: Neurology, Radiology Contact information: [address] [place] [address] [phone]         Willey Blade, MD. Schedule an appointment as soon as possible for a visit in 2 week(s).   Specialty: Internal Medicine Contact information: [address] [place] [address] [phone]                  The results of significant diagnostics from this hospitalization (including imaging, microbiology, ancillary and laboratory) are listed below for reference.    Significant Diagnostic Studies: CT HEAD WO CONTRAST (5MM)  Result Date: 06/09/2021 CLINICAL DATA:  Neck trauma, mechanically unstable. History of down syndrome EXAM: CT HEAD WITHOUT CONTRAST CT CERVICAL SPINE WITHOUT CONTRAST TECHNIQUE: Multidetector CT imaging of the head and cervical spine was performed following the standard protocol without intravenous contrast. Multiplanar CT image reconstructions of the cervical spine were also generated. COMPARISON:  10/28/2020 FINDINGS: CT HEAD FINDINGS Brain: No evidence of acute infarction, hemorrhage,  hydrocephalus, extra-axial collection or mass lesion/mass effect. Vascular: No hyperdense vessel or unexpected calcification. Skull: No acute fracture. Sinuses/Orbits: Chronic left maxillary sinusitis with opacification and atelectasis. Adenoid thickening which is symmetric. Underpneumatized and opacified right mastoid air cells with imperceptible tegmen mastoideum. Adjacent scalloping of the temporal bone is symmetric and attributed to arachnoid granulations. CT CERVICAL SPINE FINDINGS Alignment: Normal Skull base and vertebrae: No acute fracture. No primary bone lesion or focal pathologic process. Soft tissues and spinal canal: No prevertebral fluid or swelling. No visible canal hematoma. Disc levels: Generalized degenerative disc space narrowing and ventral spurring. Mainly upper cervical facet spurring with C3-4 ankylosis. Upper chest: Mild opacity in the subpleural right apex, usually scarring. Degree of tracheomalacia. IMPRESSION: 1. No evidence of acute intracranial or cervical spine injury. Normal craniocervical alignment. 2. Atrophic brain. 3. Chronic right ear with mastoid opacification and erosion suggesting cholesteatoma. Electronically Signed   By: Monte Fantasia M.D.   On: 06/09/2021 09:23   CT Cervical Spine Wo Contrast  Result Date: 06/09/2021 CLINICAL DATA:  Neck trauma, mechanically unstable. History of down syndrome EXAM: CT HEAD WITHOUT CONTRAST CT CERVICAL SPINE WITHOUT CONTRAST TECHNIQUE: Multidetector CT imaging of the head and cervical spine was performed following the standard protocol without intravenous contrast. Multiplanar CT image reconstructions of the cervical spine were also generated. COMPARISON:  10/28/2020 FINDINGS: CT HEAD FINDINGS Brain: No evidence of acute infarction, hemorrhage, hydrocephalus, extra-axial collection or mass lesion/mass effect. Vascular: No hyperdense vessel or unexpected calcification. Skull: No acute fracture. Sinuses/Orbits: Chronic left maxillary  sinusitis with opacification and atelectasis. Adenoid thickening which is symmetric. Underpneumatized and opacified right mastoid air cells with imperceptible tegmen mastoideum. Adjacent scalloping of the temporal bone is symmetric and attributed to arachnoid granulations. CT CERVICAL SPINE FINDINGS Alignment: Normal  Skull base and vertebrae: No acute fracture. No primary bone lesion or focal pathologic process. Soft tissues and spinal canal: No prevertebral fluid or swelling. No visible canal hematoma. Disc levels: Generalized degenerative disc space narrowing and ventral spurring. Mainly upper cervical facet spurring with C3-4 ankylosis. Upper chest: Mild opacity in the subpleural right apex, usually scarring. Degree of tracheomalacia. IMPRESSION: 1. No evidence of acute intracranial or cervical spine injury. Normal craniocervical alignment. 2. Atrophic brain. 3. Chronic right ear with mastoid opacification and erosion suggesting cholesteatoma. Electronically Signed   By: Monte Fantasia M.D.   On: 06/09/2021 09:23   MR BRAIN WO CONTRAST  Result Date: 06/09/2021 CLINICAL DATA:  Seizure, abnormal neuro exam. Additional history provided: Seizure, fall (hitting back of head), small hematoma noted. Patient on Xarelto. History of down syndrome. EXAM: MRI HEAD WITHOUT CONTRAST TECHNIQUE: Multiplanar, multiecho pulse sequences of the brain and surrounding structures were obtained without intravenous contrast. COMPARISON:  Head CT 06/09/2021. FINDINGS: Brain: Multiple sequences are significantly motion degraded, limiting evaluation. Most notably, there is severe motion degradation of the sagittal T1 weighted sequence, severe motion degradation of the axial T2 TSE sequence, severe motion degradation of the axial T2/FLAIR sequence, severe motion degradation of the axial SWI sequence, severe motion degradation of the axial T1 weighted sequences and severe motion degradation of the coronal T1 weighted sequence.  Redemonstrated age advanced supratentorial and infratentorial atrophy. The diffusion-weighted imaging is of good quality. No evidence of acute infarction. Motion degradation severely limits evaluation for focal parenchymal signal abnormalities on the remaining sequences. Mild-to-moderate chronic small-vessel ischemic changes within the cerebral white matter were better appreciated on the prior brain MRI of 05/07/2021. Within the limitations of motion degradation, no intracranial mass, chronic intracranial blood products or extra-axial fluid collection is identified. No midline shift. Vascular: Maintained proximal large arterial flow voids. Skull and upper cervical spine: Scalloping of the right temporal bone bilaterally, better appreciated on the same-day head CT and likely secondary to prominent arachnoid granulations. Please refer to the head CT performed earlier today for a description of right middle ear and mastoid findings raising suspicion for cholesteatoma. Sinuses/Orbits: Visualized orbits show no acute finding. Chronic opacification of an atelectatic left maxillary sinus. Partial opacification of the left ethmoid air cells. Other: Left posterior scalp soft tissue swelling. Left mastoid effusion. IMPRESSION: Significantly motion degraded examination (with severe motion degradation of many sequences), as described and significantly limiting evaluation. The diffusion-weighted imaging is of good quality. No evidence of acute infarction. Moderate chronic small-vessel ischemic changes within the cerebral white matter, better appreciated on the prior brain MRI of 05/07/2021. Age-advanced supratentorial and infratentorial parenchymal atrophy. Please refer to the same-day head CT for description of right middle ear and mastoid findings suspicious for sequela of cholesteatoma. Left mastoid effusion also present. Paranasal sinus disease, as described. Left posterior scalp soft tissue swelling. Electronically Signed    By: Kellie Simmering D.O.   On: 06/09/2021 15:17   DG CHEST PORT 1 VIEW  Result Date: 06/09/2021 CLINICAL DATA:  Cough.  Follow-up pneumonia. EXAM: PORTABLE CHEST 1 VIEW COMPARISON:  Radiograph 04/27/2021 and 12/13/2013. No recent prior studies available. FINDINGS: 1335 hours. Stable cardiomegaly and prominent epicardial fat at the right cardiophrenic angle. Diffuse interstitial prominence is again noted, minimally improved from the most recent prior study of 6 weeks ago. There is no confluent airspace opacity, pleural effusion or pneumothorax. The bones appear unremarkable. Telemetry leads overlie the chest. IMPRESSION: 1. Diffuse interstitial prominence, mildly improved from most recent study,  although increased from older prior studies. This suggests improving edema, although there may be a component of underlying interstitial lung disease. 2. No focal airspace disease. Electronically Signed   By: Richardean Sale M.D.   On: 06/09/2021 13:45   EEG adult  Result Date: 06/09/2021 Lora Havens, MD     06/09/2021  8:05 PM Patient Name: LUCIAN SCHUENKE MRN: [id number] Epilepsy Attending: Lora Havens Referring Physician/Provider: Dr Su Monks Date: 06/09/2021 Duration: 29.12 mins Patient history: 55 yo male with history of seizures who had a breakthrough seizure at home today. EEG to evaluate for seizure. Level of alertness: Awake AEDs during EEG study: LTG Technical aspects: This EEG study was done with scalp electrodes positioned according to the 10-20 International system of electrode placement. Electrical activity was acquired at a sampling rate of '500Hz'$  and reviewed with a high frequency filter of '70Hz'$  and a low frequency filter of '1Hz'$ . EEG data were recorded continuously and digitally stored. Description: No posterior dominant rhythm was seen. EEG showed continuous generalized 3 to 6 Hz theta-delta slowing. Hyperventilation and photic stimulation were not performed.   ABNORMALITY - Continuous slow,  generalized IMPRESSION: This study is suggestive of moderate diffuse encephalopathy, nonspecific etiology. No seizures or epileptiform discharges were seen throughout the recording. Lora Havens    Microbiology: Recent Results (from the past 240 hour(s))  Resp Panel by RT-PCR (Flu A&B, Covid) Nasopharyngeal Swab     Status: Abnormal   Collection Time: 06/10/21  9:20 AM   Specimen: Nasopharyngeal Swab; Nasopharyngeal(NP) swabs in vial transport medium  Result Value Ref Range Status   SARS Coronavirus 2 by RT PCR POSITIVE (A) NEGATIVE Final    Comment: RESULT CALLED TO, READ BACK BY AND VERIFIED WITH: RN Y.MATOS ON [id number] AT 1021 BY E.PARRISH (NOTE) SARS-CoV-2 target nucleic acids are DETECTED.  The SARS-CoV-2 RNA is generally detectable in upper respiratory specimens during the acute phase of infection. Positive results are indicative of the presence of the identified virus, but do not rule out bacterial infection or co-infection with other pathogens not detected by the test. Clinical correlation with patient history and other diagnostic information is necessary to determine patient infection status. The expected result is Negative.  Fact Sheet for Patients: EntrepreneurPulse.com.au  Fact Sheet for Healthcare Providers: IncredibleEmployment.be  This test is not yet approved or cleared by the [place] FDA and  has been authorized for detection and/or diagnosis of SARS-CoV-2 by FDA under an Emergency Use Authorization (EUA).  This EUA will remain in effect (meaning this t est can be used) for the duration of  the COVID-19 declaration under Section 564(b)(1) of the Act, 21 U.S.C. section 360bbb-3(b)(1), unless the authorization is terminated or revoked sooner.     Influenza A by PCR NEGATIVE NEGATIVE Final   Influenza B by PCR NEGATIVE NEGATIVE Final    Comment: (NOTE) The Xpert Xpress SARS-CoV-2/FLU/RSV plus assay is intended as an  aid in the diagnosis of influenza from Nasopharyngeal swab specimens and should not be used as a sole basis for treatment. Nasal washings and aspirates are unacceptable for Xpert Xpress SARS-CoV-2/FLU/RSV testing.  Fact Sheet for Patients: EntrepreneurPulse.com.au  Fact Sheet for Healthcare Providers: IncredibleEmployment.be  This test is not yet approved or cleared by the [place] FDA and has been authorized for detection and/or diagnosis of SARS-CoV-2 by FDA under an Emergency Use Authorization (EUA). This EUA will remain in effect (meaning this test can be used) for the duration of the COVID-19 declaration under  Section 564(b)(1) of the Act, 21 U.S.C. section 360bbb-3(b)(1), unless the authorization is terminated or revoked.  Performed at [facility], [place] [address], [place], [place] [address]      Labs: Basic Metabolic Panel: Recent Labs  Lab 06/09/21 0900 06/10/21 0914 06/11/21 0115  NA 139 137 138  K 3.7 3.9 3.5  CL 107 105 106  CO2 '26 26 26  '$ GLUCOSE 100* 101* 100*  BUN '9 7 8  '$ CREATININE 0.98 0.92 0.85  CALCIUM 8.3* 8.4* 8.2*  MG  --  2.2 2.3  PHOS  --  3.6 4.4   Liver Function Tests: Recent Labs  Lab 06/09/21 0900 06/10/21 0914 06/11/21 0115  AST 25  --  22  ALT 18  --  17  ALKPHOS 78  --  76  BILITOT 0.4  --  0.6  PROT 6.4*  --  6.1*  ALBUMIN 2.5* 2.5* 2.5*   No results for input(s): LIPASE, AMYLASE in the last 168 hours. No results for input(s): AMMONIA in the last 168 hours. CBC: Recent Labs  Lab 06/09/21 0900 06/10/21 0914 06/11/21 0115  WBC 4.2 4.1 3.5*  NEUTROABS 2.8 2.7 2.2  HGB 13.3 13.2 13.0  HCT 40.2 39.2 38.5*  MCV 103.3* 100.3* 99.5  PLT 221 275 267   Cardiac Enzymes: No results for input(s): CKTOTAL, CKMB, CKMBINDEX, TROPONINI in the last 168 hours. BNP: BNP (last 3 results) Recent Labs    06/10/21 1050  BNP 38.6    ProBNP (last 3 results) No results for input(s):  PROBNP in the last 8760 hours.  CBG: Recent Labs  Lab 06/09/21 0921  GLUCAP 92       Signed:  Irine Seal MD.  [facility] 06/11/2021, 12:07 PM

## 2021-06-11 NOTE — TOC Progression Note (Signed)
Transition of Care [facility]) - Progression Note    Patient Details  Name: Joseph Williams MRN: [id number] Date of Birth: 1966/05/01  Transition of Care [facility]) CM/SW [place], RN Phone [phone]  06/11/2021, 12:00 PM  Clinical Narrative:    CM called patient to set up [facility]. Mother answers and states that she is the patients power of attorney and that she does not want home health at this time. Mother states that she thinks that she can manage patient without therapy. MD made aware. TOC will sign off.        Expected Discharge Plan and Services           Expected Discharge Date: 06/11/21                                     Social Determinants of Health (SDOH) Interventions    Readmission Risk Interventions No flowsheet data found.

## 2021-06-13 ENCOUNTER — Telehealth: Payer: Self-pay

## 2021-06-13 NOTE — Telephone Encounter (Signed)
Hi Dr Leta Baptist,  we received a call from Joseph Williams mother regarding the patients recent hospitalization. His mother, Joseph Williams, was concerned about his next appointment with you being in November when the ED recommended a follow up in 2 weeks. I did advise her that we are working on getting Mack in for a sooner appointment within the next few weeks, and she expressed appreciation. She did want to see if you would be able to check in with them via mychart to make sure that the patients medications are in order and establish a plan based on these new seizures.   Would you be able to reach out to [facility] via [place] or phone call?  Thank you!

## 2021-06-18 NOTE — Telephone Encounter (Signed)
I called patient's mother to discuss patient status.  He is now on lacosamide 100 mg twice a day and lamotrigine titration increasing dose.  Patient now able to walk and feed himself.  Still not able to return to other baseline ADLs.  Advised patient's mother to continue medications and monitor over time.  Hopefully symptoms are gradually improved.  I explained that patient probably had some underlying mild dementia that was developing before his fall and concussion and seizure.  Dementia can increase risk for seizures.  Also he may be having a prolonged postictal encephalopathy state related to combination of factors.  Another possibility is that he may have had some hypoxic ischemic encephalopathy in the course of his seizure events at home.  Although there was no MRI evidence of this, this diagnosis could become clear over time.  Patient's mother was satisfied with the results and discussion.  She will return with patient in November for follow-up.  I spent 27 minutes of non-face-to-face time with patient's mother.  This included previsit chart review, lab review, study review, order entry, electronic health record documentation, patient education.     Penni Bombard, MD [id number], [id number] PM Certified in Neurology, Neurophysiology and Neuroimaging  [facility] [address] [place], [place] [address] [phone]

## 2021-06-19 NOTE — Telephone Encounter (Signed)
Noted, thank you

## 2021-06-21 DIAGNOSIS — R569 Unspecified convulsions: Secondary | ICD-10-CM | POA: Diagnosis not present

## 2021-06-21 DIAGNOSIS — I82401 Acute embolism and thrombosis of unspecified deep veins of right lower extremity: Secondary | ICD-10-CM | POA: Diagnosis not present

## 2021-06-21 DIAGNOSIS — R296 Repeated falls: Secondary | ICD-10-CM | POA: Diagnosis not present

## 2021-07-11 ENCOUNTER — Telehealth: Payer: Self-pay | Admitting: Diagnostic Neuroimaging

## 2021-07-11 MED ORDER — QUETIAPINE FUMARATE 25 MG PO TABS: ORAL_TABLET | ORAL | 1 refills | Status: DC

## 2021-07-11 NOTE — Telephone Encounter (Signed)
Pt 's mother states pt is becoming more violent on current medication.  Pt's mother is asking for help, please call.

## 2021-07-11 NOTE — Telephone Encounter (Signed)
I called pt's mom back and we reviewed recommendation. She is agreeable to trying Seroquel 25 mg PRN and asked for rx to be sent to [facility].  Rx # 30 with 1 refill has been sent.  Pt is currently taking 50 mg bid and has not titrated up.  Pt will continue this dosage for now and mom will let us know how he does on the Seroquel.   Recommend to continue lamotrigine 75mg  twice a day. May consider to wean off vimpat.   For agitation, if severe, can use seroquel 25mg  as needed.    -VRP

## 2021-07-11 NOTE — Telephone Encounter (Signed)
I called pt's mom and we discussed message. She reports over the last 1-2 weeks pt has become more violent. She states the pt is yelling, clenching his teeth/fists, and is combative at times.  She confirmed the pt is currently taking lacosamide 100 mg 2 tablets daily and Lamictal 50 mg bid. She sts the pt was scheduled to increase the Lamictal to 75 mg bid yesterday but she held off because of the increased violent out bursts.  Pt is on the schedule for f/u for 07/31/2021, but mom wanted to know if anything could be recommended in interim to help with the patients outbursts?

## 2021-07-17 ENCOUNTER — Telehealth: Payer: Self-pay | Admitting: Neurology

## 2021-07-17 NOTE — Telephone Encounter (Signed)
Spoke with mother [name] who needed advised regarding Joseph Williams agitation. She reports Joseph Williams is still agitated, and she had questions regarding the Seroquel and his antiseizure medications.   I advised her to increase the Seroquel to 25 mg twice daily as needed, increase the lamotrigine to 75 mg twice daily for 1 week then increase to 100 mg twice daily thereafter, and to continue lacosamide 100 mg twice daily until October 18 then discontinue. They are supposed to follow-up with Dr. Leta Baptist on October 26.   Alric Ran, MD

## 2021-07-25 DIAGNOSIS — R55 Syncope and collapse: Secondary | ICD-10-CM | POA: Diagnosis not present

## 2021-07-26 DIAGNOSIS — R569 Unspecified convulsions: Secondary | ICD-10-CM | POA: Diagnosis not present

## 2021-07-26 DIAGNOSIS — I82501 Chronic embolism and thrombosis of unspecified deep veins of right lower extremity: Secondary | ICD-10-CM | POA: Diagnosis not present

## 2021-07-26 DIAGNOSIS — Z23 Encounter for immunization: Secondary | ICD-10-CM | POA: Diagnosis not present

## 2021-07-26 DIAGNOSIS — R296 Repeated falls: Secondary | ICD-10-CM | POA: Diagnosis not present

## 2021-07-26 DIAGNOSIS — R410 Disorientation, unspecified: Secondary | ICD-10-CM | POA: Diagnosis not present

## 2021-07-31 ENCOUNTER — Ambulatory Visit: Payer: Medicare Other | Admitting: Diagnostic Neuroimaging

## 2021-08-20 ENCOUNTER — Ambulatory Visit: Payer: Medicare Other | Admitting: Diagnostic Neuroimaging

## 2021-08-20 DIAGNOSIS — R55 Syncope and collapse: Secondary | ICD-10-CM | POA: Diagnosis not present

## 2021-08-20 DIAGNOSIS — Z9181 History of falling: Secondary | ICD-10-CM | POA: Diagnosis not present

## 2021-08-22 DIAGNOSIS — Z9181 History of falling: Secondary | ICD-10-CM | POA: Diagnosis not present

## 2021-08-22 DIAGNOSIS — R55 Syncope and collapse: Secondary | ICD-10-CM | POA: Diagnosis not present

## 2021-08-26 DIAGNOSIS — Z9181 History of falling: Secondary | ICD-10-CM | POA: Diagnosis not present

## 2021-08-26 DIAGNOSIS — R55 Syncope and collapse: Secondary | ICD-10-CM | POA: Diagnosis not present

## 2021-08-28 DIAGNOSIS — R55 Syncope and collapse: Secondary | ICD-10-CM | POA: Diagnosis not present

## 2021-08-28 DIAGNOSIS — Z9181 History of falling: Secondary | ICD-10-CM | POA: Diagnosis not present

## 2021-09-03 DIAGNOSIS — R55 Syncope and collapse: Secondary | ICD-10-CM | POA: Diagnosis not present

## 2021-09-03 DIAGNOSIS — Z9181 History of falling: Secondary | ICD-10-CM | POA: Diagnosis not present

## 2021-09-04 DIAGNOSIS — R55 Syncope and collapse: Secondary | ICD-10-CM | POA: Diagnosis not present

## 2021-09-04 DIAGNOSIS — Z9181 History of falling: Secondary | ICD-10-CM | POA: Diagnosis not present

## 2021-09-05 DIAGNOSIS — R002 Palpitations: Secondary | ICD-10-CM | POA: Diagnosis not present

## 2021-09-05 DIAGNOSIS — R55 Syncope and collapse: Secondary | ICD-10-CM | POA: Diagnosis not present

## 2021-09-05 DIAGNOSIS — R9431 Abnormal electrocardiogram [ECG] [EKG]: Secondary | ICD-10-CM | POA: Diagnosis not present

## 2021-09-05 DIAGNOSIS — I4510 Unspecified right bundle-branch block: Secondary | ICD-10-CM | POA: Diagnosis not present

## 2021-09-06 DIAGNOSIS — Z9181 History of falling: Secondary | ICD-10-CM | POA: Diagnosis not present

## 2021-09-06 DIAGNOSIS — R55 Syncope and collapse: Secondary | ICD-10-CM | POA: Diagnosis not present

## 2021-09-10 DIAGNOSIS — R55 Syncope and collapse: Secondary | ICD-10-CM | POA: Diagnosis not present

## 2021-09-10 DIAGNOSIS — Z9181 History of falling: Secondary | ICD-10-CM | POA: Diagnosis not present

## 2021-09-11 DIAGNOSIS — R55 Syncope and collapse: Secondary | ICD-10-CM | POA: Diagnosis not present

## 2021-09-11 DIAGNOSIS — I4510 Unspecified right bundle-branch block: Secondary | ICD-10-CM | POA: Diagnosis not present

## 2021-09-11 DIAGNOSIS — R002 Palpitations: Secondary | ICD-10-CM | POA: Diagnosis not present

## 2021-09-12 DIAGNOSIS — Z9181 History of falling: Secondary | ICD-10-CM | POA: Diagnosis not present

## 2021-09-12 DIAGNOSIS — R55 Syncope and collapse: Secondary | ICD-10-CM | POA: Diagnosis not present

## 2021-09-13 DIAGNOSIS — R55 Syncope and collapse: Secondary | ICD-10-CM | POA: Diagnosis not present

## 2021-09-13 DIAGNOSIS — Z9181 History of falling: Secondary | ICD-10-CM | POA: Diagnosis not present

## 2021-09-16 DIAGNOSIS — Z9181 History of falling: Secondary | ICD-10-CM | POA: Diagnosis not present

## 2021-09-16 DIAGNOSIS — R55 Syncope and collapse: Secondary | ICD-10-CM | POA: Diagnosis not present

## 2021-09-17 DIAGNOSIS — R55 Syncope and collapse: Secondary | ICD-10-CM | POA: Diagnosis not present

## 2021-09-17 DIAGNOSIS — Z9181 History of falling: Secondary | ICD-10-CM | POA: Diagnosis not present

## 2021-09-18 DIAGNOSIS — G4733 Obstructive sleep apnea (adult) (pediatric): Secondary | ICD-10-CM | POA: Diagnosis not present

## 2021-09-25 DIAGNOSIS — G4750 Parasomnia, unspecified: Secondary | ICD-10-CM | POA: Diagnosis not present

## 2021-09-25 DIAGNOSIS — R55 Syncope and collapse: Secondary | ICD-10-CM | POA: Diagnosis not present

## 2021-09-25 DIAGNOSIS — G4733 Obstructive sleep apnea (adult) (pediatric): Secondary | ICD-10-CM | POA: Diagnosis not present

## 2021-09-25 DIAGNOSIS — G4734 Idiopathic sleep related nonobstructive alveolar hypoventilation: Secondary | ICD-10-CM | POA: Diagnosis not present

## 2021-09-25 DIAGNOSIS — Z9181 History of falling: Secondary | ICD-10-CM | POA: Diagnosis not present

## 2021-09-26 DIAGNOSIS — I825Y1 Chronic embolism and thrombosis of unspecified deep veins of right proximal lower extremity: Secondary | ICD-10-CM | POA: Diagnosis not present

## 2021-09-26 DIAGNOSIS — I82501 Chronic embolism and thrombosis of unspecified deep veins of right lower extremity: Secondary | ICD-10-CM | POA: Diagnosis not present

## 2021-09-26 DIAGNOSIS — R569 Unspecified convulsions: Secondary | ICD-10-CM | POA: Diagnosis not present

## 2021-09-27 DIAGNOSIS — R55 Syncope and collapse: Secondary | ICD-10-CM | POA: Diagnosis not present

## 2021-09-27 DIAGNOSIS — R002 Palpitations: Secondary | ICD-10-CM | POA: Diagnosis not present

## 2021-10-01 DIAGNOSIS — G4700 Insomnia, unspecified: Secondary | ICD-10-CM | POA: Diagnosis not present

## 2021-10-02 DIAGNOSIS — Z9181 History of falling: Secondary | ICD-10-CM | POA: Diagnosis not present

## 2021-10-02 DIAGNOSIS — R55 Syncope and collapse: Secondary | ICD-10-CM | POA: Diagnosis not present

## 2021-10-03 DIAGNOSIS — Z9181 History of falling: Secondary | ICD-10-CM | POA: Diagnosis not present

## 2021-10-03 DIAGNOSIS — R55 Syncope and collapse: Secondary | ICD-10-CM | POA: Diagnosis not present

## 2021-10-11 DIAGNOSIS — Z9181 History of falling: Secondary | ICD-10-CM | POA: Diagnosis not present

## 2021-10-11 DIAGNOSIS — R55 Syncope and collapse: Secondary | ICD-10-CM | POA: Diagnosis not present

## 2021-10-17 DIAGNOSIS — R002 Palpitations: Secondary | ICD-10-CM | POA: Diagnosis not present

## 2021-10-17 DIAGNOSIS — R55 Syncope and collapse: Secondary | ICD-10-CM | POA: Diagnosis not present

## 2021-11-01 DIAGNOSIS — J209 Acute bronchitis, unspecified: Secondary | ICD-10-CM | POA: Diagnosis not present

## 2021-11-06 DIAGNOSIS — R55 Syncope and collapse: Secondary | ICD-10-CM | POA: Diagnosis not present

## 2021-11-06 DIAGNOSIS — I4510 Unspecified right bundle-branch block: Secondary | ICD-10-CM | POA: Diagnosis not present

## 2021-11-06 DIAGNOSIS — R002 Palpitations: Secondary | ICD-10-CM | POA: Diagnosis not present

## 2021-11-26 DIAGNOSIS — H16143 Punctate keratitis, bilateral: Secondary | ICD-10-CM | POA: Diagnosis not present

## 2021-11-26 DIAGNOSIS — H524 Presbyopia: Secondary | ICD-10-CM | POA: Diagnosis not present

## 2021-11-26 DIAGNOSIS — H18713 Corneal ectasia, bilateral: Secondary | ICD-10-CM | POA: Diagnosis not present

## 2021-11-26 DIAGNOSIS — H52223 Regular astigmatism, bilateral: Secondary | ICD-10-CM | POA: Diagnosis not present

## 2021-12-03 DIAGNOSIS — R5382 Chronic fatigue, unspecified: Secondary | ICD-10-CM | POA: Diagnosis not present

## 2021-12-03 DIAGNOSIS — E039 Hypothyroidism, unspecified: Secondary | ICD-10-CM | POA: Diagnosis not present

## 2021-12-03 DIAGNOSIS — E7800 Pure hypercholesterolemia, unspecified: Secondary | ICD-10-CM | POA: Diagnosis not present

## 2021-12-03 DIAGNOSIS — E785 Hyperlipidemia, unspecified: Secondary | ICD-10-CM | POA: Diagnosis not present

## 2021-12-17 DIAGNOSIS — G4733 Obstructive sleep apnea (adult) (pediatric): Secondary | ICD-10-CM | POA: Diagnosis not present

## 2021-12-18 DIAGNOSIS — G4733 Obstructive sleep apnea (adult) (pediatric): Secondary | ICD-10-CM | POA: Diagnosis not present

## 2021-12-18 DIAGNOSIS — R0989 Other specified symptoms and signs involving the circulatory and respiratory systems: Secondary | ICD-10-CM | POA: Diagnosis not present

## 2021-12-18 DIAGNOSIS — Z0000 Encounter for general adult medical examination without abnormal findings: Secondary | ICD-10-CM | POA: Diagnosis not present

## 2021-12-18 DIAGNOSIS — D72819 Decreased white blood cell count, unspecified: Secondary | ICD-10-CM | POA: Diagnosis not present

## 2021-12-18 DIAGNOSIS — R569 Unspecified convulsions: Secondary | ICD-10-CM | POA: Diagnosis not present

## 2021-12-18 DIAGNOSIS — E039 Hypothyroidism, unspecified: Secondary | ICD-10-CM | POA: Diagnosis not present

## 2021-12-18 DIAGNOSIS — E7800 Pure hypercholesterolemia, unspecified: Secondary | ICD-10-CM | POA: Diagnosis not present

## 2021-12-18 DIAGNOSIS — R7309 Other abnormal glucose: Secondary | ICD-10-CM | POA: Diagnosis not present

## 2021-12-18 DIAGNOSIS — I82501 Chronic embolism and thrombosis of unspecified deep veins of right lower extremity: Secondary | ICD-10-CM | POA: Diagnosis not present

## 2021-12-18 DIAGNOSIS — E785 Hyperlipidemia, unspecified: Secondary | ICD-10-CM | POA: Diagnosis not present

## 2021-12-30 DIAGNOSIS — G4733 Obstructive sleep apnea (adult) (pediatric): Secondary | ICD-10-CM | POA: Diagnosis not present

## 2022-01-08 DIAGNOSIS — Z961 Presence of intraocular lens: Secondary | ICD-10-CM | POA: Diagnosis not present

## 2022-01-08 DIAGNOSIS — H18603 Keratoconus, unspecified, bilateral: Secondary | ICD-10-CM | POA: Diagnosis not present

## 2022-02-12 DIAGNOSIS — Z961 Presence of intraocular lens: Secondary | ICD-10-CM | POA: Diagnosis not present

## 2022-02-12 DIAGNOSIS — H18603 Keratoconus, unspecified, bilateral: Secondary | ICD-10-CM | POA: Diagnosis not present

## 2022-02-17 DIAGNOSIS — R0989 Other specified symptoms and signs involving the circulatory and respiratory systems: Secondary | ICD-10-CM | POA: Diagnosis not present

## 2022-02-17 DIAGNOSIS — R052 Subacute cough: Secondary | ICD-10-CM | POA: Diagnosis not present

## 2022-03-18 DIAGNOSIS — G253 Myoclonus: Secondary | ICD-10-CM | POA: Diagnosis not present

## 2022-03-25 DIAGNOSIS — G4733 Obstructive sleep apnea (adult) (pediatric): Secondary | ICD-10-CM | POA: Diagnosis not present

## 2022-03-29 ENCOUNTER — Emergency Department (HOSPITAL_COMMUNITY): Payer: Medicare Other

## 2022-03-29 ENCOUNTER — Other Ambulatory Visit: Payer: Self-pay

## 2022-03-29 ENCOUNTER — Inpatient Hospital Stay (HOSPITAL_COMMUNITY)
Admission: EM | Admit: 2022-03-29 | Discharge: 2022-04-03 | DRG: 871 | Disposition: A | Discharge status: Hospice | Payer: Medicare Other | Attending: Internal Medicine | Admitting: Internal Medicine

## 2022-03-29 ENCOUNTER — Encounter (HOSPITAL_COMMUNITY): Payer: Self-pay

## 2022-03-29 DIAGNOSIS — Z86718 Personal history of other venous thrombosis and embolism: Secondary | ICD-10-CM

## 2022-03-29 DIAGNOSIS — R4701 Aphasia: Secondary | ICD-10-CM | POA: Diagnosis not present

## 2022-03-29 DIAGNOSIS — Z743 Need for continuous supervision: Secondary | ICD-10-CM | POA: Diagnosis not present

## 2022-03-29 DIAGNOSIS — A419 Sepsis, unspecified organism: Principal | ICD-10-CM | POA: Diagnosis present

## 2022-03-29 DIAGNOSIS — J690 Pneumonitis due to inhalation of food and vomit: Secondary | ICD-10-CM | POA: Diagnosis present

## 2022-03-29 DIAGNOSIS — Z79890 Hormone replacement therapy (postmenopausal): Secondary | ICD-10-CM

## 2022-03-29 DIAGNOSIS — Z515 Encounter for palliative care: Secondary | ICD-10-CM | POA: Diagnosis not present

## 2022-03-29 DIAGNOSIS — Z66 Do not resuscitate: Secondary | ICD-10-CM | POA: Diagnosis not present

## 2022-03-29 DIAGNOSIS — R531 Weakness: Secondary | ICD-10-CM | POA: Diagnosis not present

## 2022-03-29 DIAGNOSIS — G4733 Obstructive sleep apnea (adult) (pediatric): Secondary | ICD-10-CM | POA: Diagnosis present

## 2022-03-29 DIAGNOSIS — J189 Pneumonia, unspecified organism: Secondary | ICD-10-CM | POA: Diagnosis not present

## 2022-03-29 DIAGNOSIS — Z808 Family history of malignant neoplasm of other organs or systems: Secondary | ICD-10-CM | POA: Diagnosis not present

## 2022-03-29 DIAGNOSIS — Z6841 Body Mass Index (BMI) 40.0 and over, adult: Secondary | ICD-10-CM

## 2022-03-29 DIAGNOSIS — E039 Hypothyroidism, unspecified: Secondary | ICD-10-CM | POA: Diagnosis not present

## 2022-03-29 DIAGNOSIS — R918 Other nonspecific abnormal finding of lung field: Secondary | ICD-10-CM | POA: Diagnosis not present

## 2022-03-29 DIAGNOSIS — R0602 Shortness of breath: Secondary | ICD-10-CM | POA: Diagnosis not present

## 2022-03-29 DIAGNOSIS — G40909 Epilepsy, unspecified, not intractable, without status epilepticus: Secondary | ICD-10-CM | POA: Diagnosis present

## 2022-03-29 DIAGNOSIS — R739 Hyperglycemia, unspecified: Secondary | ICD-10-CM | POA: Diagnosis present

## 2022-03-29 DIAGNOSIS — Z79899 Other long term (current) drug therapy: Secondary | ICD-10-CM

## 2022-03-29 DIAGNOSIS — R1312 Dysphagia, oropharyngeal phase: Secondary | ICD-10-CM | POA: Diagnosis not present

## 2022-03-29 DIAGNOSIS — E7800 Pure hypercholesterolemia, unspecified: Secondary | ICD-10-CM | POA: Diagnosis present

## 2022-03-29 DIAGNOSIS — Q909 Down syndrome, unspecified: Secondary | ICD-10-CM

## 2022-03-29 DIAGNOSIS — R569 Unspecified convulsions: Secondary | ICD-10-CM

## 2022-03-29 DIAGNOSIS — R000 Tachycardia, unspecified: Secondary | ICD-10-CM | POA: Diagnosis not present

## 2022-03-29 DIAGNOSIS — Z803 Family history of malignant neoplasm of breast: Secondary | ICD-10-CM

## 2022-03-29 DIAGNOSIS — Z7901 Long term (current) use of anticoagulants: Secondary | ICD-10-CM | POA: Diagnosis not present

## 2022-03-29 DIAGNOSIS — Z8249 Family history of ischemic heart disease and other diseases of the circulatory system: Secondary | ICD-10-CM | POA: Diagnosis not present

## 2022-03-29 DIAGNOSIS — Z8616 Personal history of COVID-19: Secondary | ICD-10-CM

## 2022-03-29 DIAGNOSIS — R0689 Other abnormalities of breathing: Secondary | ICD-10-CM | POA: Diagnosis not present

## 2022-03-29 DIAGNOSIS — Z7189 Other specified counseling: Secondary | ICD-10-CM | POA: Diagnosis not present

## 2022-03-29 DIAGNOSIS — R0603 Acute respiratory distress: Secondary | ICD-10-CM | POA: Diagnosis not present

## 2022-03-29 DIAGNOSIS — R6889 Other general symptoms and signs: Secondary | ICD-10-CM | POA: Diagnosis not present

## 2022-03-29 DIAGNOSIS — R509 Fever, unspecified: Secondary | ICD-10-CM | POA: Diagnosis not present

## 2022-03-29 DIAGNOSIS — J9601 Acute respiratory failure with hypoxia: Secondary | ICD-10-CM | POA: Diagnosis not present

## 2022-03-29 DIAGNOSIS — R0600 Dyspnea, unspecified: Secondary | ICD-10-CM | POA: Diagnosis not present

## 2022-03-29 LAB — BLOOD GAS, VENOUS
Acid-Base Excess: 8 mmol/L — ABNORMAL HIGH (ref 0.0–2.0)
Bicarbonate: 33.4 mmol/L — ABNORMAL HIGH (ref 20.0–28.0)
O2 Saturation: 61.4 %
Patient temperature: 37
pCO2, Ven: 48 mmHg (ref 44–60)
pH, Ven: 7.45 — ABNORMAL HIGH (ref 7.25–7.43)
pO2, Ven: 32 mmHg (ref 32–45)

## 2022-03-29 LAB — COMPREHENSIVE METABOLIC PANEL
ALT: 22 U/L (ref 0–44)
AST: 29 U/L (ref 15–41)
Albumin: 3.3 g/dL — ABNORMAL LOW (ref 3.5–5.0)
Alkaline Phosphatase: 84 U/L (ref 38–126)
Anion gap: 5 (ref 5–15)
BUN: 12 mg/dL (ref 6–20)
CO2: 29 mmol/L (ref 22–32)
Calcium: 8.8 mg/dL — ABNORMAL LOW (ref 8.9–10.3)
Chloride: 108 mmol/L (ref 98–111)
Creatinine, Ser: 1.17 mg/dL (ref 0.61–1.24)
GFR, Estimated: >60 mL/min (ref >60)
Glucose, Bld: 116 mg/dL — ABNORMAL HIGH (ref 70–99)
Potassium: 3.8 mmol/L (ref 3.5–5.1)
Sodium: 142 mmol/L (ref 135–145)
Total Bilirubin: 0.7 mg/dL (ref 0.3–1.2)
Total Protein: 7.4 g/dL (ref 6.5–8.1)

## 2022-03-29 LAB — CBC WITH DIFFERENTIAL/PLATELET
Abs Immature Granulocytes: 0.04 10*3/uL (ref 0.00–0.07)
Basophils Absolute: 0.1 10*3/uL (ref 0.0–0.1)
Basophils Relative: 0 %
Eosinophils Absolute: 0 10*3/uL (ref 0.0–0.5)
Eosinophils Relative: 0 %
HCT: 44.5 % (ref 39.0–52.0)
Hemoglobin: 15.1 g/dL (ref 13.0–17.0)
Immature Granulocytes: 0 %
Lymphocytes Relative: 6 %
Lymphs Abs: 0.7 10*3/uL (ref 0.7–4.0)
MCH: 34.2 pg — ABNORMAL HIGH (ref 26.0–34.0)
MCHC: 33.9 g/dL (ref 30.0–36.0)
MCV: 100.9 fL — ABNORMAL HIGH (ref 80.0–100.0)
Monocytes Absolute: 0.8 10*3/uL (ref 0.1–1.0)
Monocytes Relative: 6 %
Neutro Abs: 10.1 10*3/uL — ABNORMAL HIGH (ref 1.7–7.7)
Neutrophils Relative %: 88 %
Platelets: 219 10*3/uL (ref 150–400)
RBC: 4.41 MIL/uL (ref 4.22–5.81)
RDW: 13.3 % (ref 11.5–15.5)
WBC: 11.7 10*3/uL — ABNORMAL HIGH (ref 4.0–10.5)
nRBC: 0 % (ref 0.0–0.2)

## 2022-03-29 LAB — APTT: aPTT: 35 seconds (ref 24–36)

## 2022-03-29 LAB — PROTIME-INR
INR: 2 — ABNORMAL HIGH (ref 0.8–1.2)
Prothrombin Time: 22.1 seconds — ABNORMAL HIGH (ref 11.4–15.2)

## 2022-03-29 LAB — LACTIC ACID, PLASMA: Lactic Acid, Venous: 1.8 mmol/L (ref 0.5–1.9)

## 2022-03-29 MED ORDER — SODIUM CHLORIDE 0.9 % IV BOLUS (SEPSIS)
1000.0000 mL | Freq: Once | INTRAVENOUS | Status: AC
  Administered 2022-03-29: 1000 mL via INTRAVENOUS

## 2022-03-29 MED ORDER — SODIUM CHLORIDE 0.9 % IV SOLN
2.0000 g | INTRAVENOUS | Status: DC
  Administered 2022-03-29: 2 g via INTRAVENOUS
  Filled 2022-03-29: qty 20

## 2022-03-29 MED ORDER — LACTATED RINGERS IV SOLN: INTRAVENOUS | Status: DC

## 2022-03-29 MED ORDER — SODIUM CHLORIDE 0.9 % IV BOLUS (SEPSIS)
1000.0000 mL | Freq: Once | INTRAVENOUS | Status: AC
  Administered 2022-03-30: 1000 mL via INTRAVENOUS

## 2022-03-29 MED ORDER — METHYLPREDNISOLONE SODIUM SUCC 125 MG IJ SOLR
125.0000 mg | Freq: Once | INTRAMUSCULAR | Status: AC
  Administered 2022-03-29: 125 mg via INTRAVENOUS
  Filled 2022-03-29: qty 2

## 2022-03-29 MED ORDER — SODIUM CHLORIDE 0.9 % IV SOLN
500.0000 mg | INTRAVENOUS | Status: DC
  Administered 2022-03-29: 500 mg via INTRAVENOUS
  Filled 2022-03-29: qty 5

## 2022-03-29 MED ORDER — IPRATROPIUM-ALBUTEROL 0.5-2.5 (3) MG/3ML IN SOLN
3.0000 mL | Freq: Once | RESPIRATORY_TRACT | Status: AC
  Administered 2022-03-29: 3 mL via RESPIRATORY_TRACT
  Filled 2022-03-29: qty 3

## 2022-03-29 NOTE — ED Triage Notes (Signed)
Pt from home, complaining of shortness of breath. Also has been running a temp and having muscle fatigue since 7 pm

## 2022-03-29 NOTE — Sepsis Progress Note (Signed)
Elink following for Sepsis Protocol 

## 2022-03-30 ENCOUNTER — Inpatient Hospital Stay (HOSPITAL_COMMUNITY): Payer: Medicare Other

## 2022-03-30 DIAGNOSIS — Z8616 Personal history of COVID-19: Secondary | ICD-10-CM | POA: Diagnosis not present

## 2022-03-30 DIAGNOSIS — R4701 Aphasia: Secondary | ICD-10-CM | POA: Diagnosis present

## 2022-03-30 DIAGNOSIS — Z515 Encounter for palliative care: Secondary | ICD-10-CM | POA: Diagnosis not present

## 2022-03-30 DIAGNOSIS — Z79890 Hormone replacement therapy (postmenopausal): Secondary | ICD-10-CM | POA: Diagnosis not present

## 2022-03-30 DIAGNOSIS — G4733 Obstructive sleep apnea (adult) (pediatric): Secondary | ICD-10-CM | POA: Diagnosis present

## 2022-03-30 DIAGNOSIS — J690 Pneumonitis due to inhalation of food and vomit: Secondary | ICD-10-CM | POA: Diagnosis present

## 2022-03-30 DIAGNOSIS — R531 Weakness: Secondary | ICD-10-CM | POA: Diagnosis not present

## 2022-03-30 DIAGNOSIS — G40909 Epilepsy, unspecified, not intractable, without status epilepticus: Secondary | ICD-10-CM | POA: Diagnosis present

## 2022-03-30 DIAGNOSIS — A419 Sepsis, unspecified organism: Principal | ICD-10-CM

## 2022-03-30 DIAGNOSIS — E039 Hypothyroidism, unspecified: Secondary | ICD-10-CM | POA: Diagnosis present

## 2022-03-30 DIAGNOSIS — Z808 Family history of malignant neoplasm of other organs or systems: Secondary | ICD-10-CM | POA: Diagnosis not present

## 2022-03-30 DIAGNOSIS — J9601 Acute respiratory failure with hypoxia: Secondary | ICD-10-CM | POA: Diagnosis not present

## 2022-03-30 DIAGNOSIS — Q909 Down syndrome, unspecified: Secondary | ICD-10-CM | POA: Diagnosis not present

## 2022-03-30 DIAGNOSIS — R569 Unspecified convulsions: Secondary | ICD-10-CM | POA: Diagnosis not present

## 2022-03-30 DIAGNOSIS — R0602 Shortness of breath: Secondary | ICD-10-CM | POA: Diagnosis not present

## 2022-03-30 DIAGNOSIS — Z86718 Personal history of other venous thrombosis and embolism: Secondary | ICD-10-CM | POA: Diagnosis not present

## 2022-03-30 DIAGNOSIS — Z6841 Body Mass Index (BMI) 40.0 and over, adult: Secondary | ICD-10-CM | POA: Diagnosis not present

## 2022-03-30 DIAGNOSIS — J189 Pneumonia, unspecified organism: Secondary | ICD-10-CM | POA: Diagnosis not present

## 2022-03-30 DIAGNOSIS — R0600 Dyspnea, unspecified: Secondary | ICD-10-CM | POA: Diagnosis not present

## 2022-03-30 DIAGNOSIS — Z79899 Other long term (current) drug therapy: Secondary | ICD-10-CM | POA: Diagnosis not present

## 2022-03-30 DIAGNOSIS — R739 Hyperglycemia, unspecified: Secondary | ICD-10-CM | POA: Diagnosis present

## 2022-03-30 DIAGNOSIS — Z803 Family history of malignant neoplasm of breast: Secondary | ICD-10-CM | POA: Diagnosis not present

## 2022-03-30 DIAGNOSIS — Z7901 Long term (current) use of anticoagulants: Secondary | ICD-10-CM | POA: Diagnosis not present

## 2022-03-30 DIAGNOSIS — R1312 Dysphagia, oropharyngeal phase: Secondary | ICD-10-CM | POA: Diagnosis present

## 2022-03-30 DIAGNOSIS — Z8249 Family history of ischemic heart disease and other diseases of the circulatory system: Secondary | ICD-10-CM | POA: Diagnosis not present

## 2022-03-30 DIAGNOSIS — E7800 Pure hypercholesterolemia, unspecified: Secondary | ICD-10-CM | POA: Diagnosis present

## 2022-03-30 DIAGNOSIS — R918 Other nonspecific abnormal finding of lung field: Secondary | ICD-10-CM | POA: Diagnosis not present

## 2022-03-30 DIAGNOSIS — Z66 Do not resuscitate: Secondary | ICD-10-CM | POA: Diagnosis not present

## 2022-03-30 DIAGNOSIS — Z7189 Other specified counseling: Secondary | ICD-10-CM | POA: Diagnosis not present

## 2022-03-30 LAB — URINALYSIS, ROUTINE W REFLEX MICROSCOPIC
Bilirubin Urine: NEGATIVE
Glucose, UA: NEGATIVE mg/dL
Hgb urine dipstick: NEGATIVE
Ketones, ur: NEGATIVE mg/dL
Leukocytes,Ua: NEGATIVE
Nitrite: NEGATIVE
Protein, ur: NEGATIVE mg/dL
Specific Gravity, Urine: 1.009 (ref 1.005–1.030)
pH: 7 (ref 5.0–8.0)

## 2022-03-30 LAB — RESP PANEL BY RT-PCR (FLU A&B, COVID) ARPGX2
Influenza A by PCR: NEGATIVE
Influenza B by PCR: NEGATIVE
SARS Coronavirus 2 by RT PCR: NEGATIVE

## 2022-03-30 LAB — COMPREHENSIVE METABOLIC PANEL
ALT: 25 U/L (ref 0–44)
AST: 32 U/L (ref 15–41)
Albumin: 2.9 g/dL — ABNORMAL LOW (ref 3.5–5.0)
Alkaline Phosphatase: 72 U/L (ref 38–126)
Anion gap: 7 (ref 5–15)
BUN: 11 mg/dL (ref 6–20)
CO2: 25 mmol/L (ref 22–32)
Calcium: 8.2 mg/dL — ABNORMAL LOW (ref 8.9–10.3)
Chloride: 111 mmol/L (ref 98–111)
Creatinine, Ser: 0.98 mg/dL (ref 0.61–1.24)
GFR, Estimated: >60 mL/min (ref >60)
Glucose, Bld: 162 mg/dL — ABNORMAL HIGH (ref 70–99)
Potassium: 4.1 mmol/L (ref 3.5–5.1)
Sodium: 143 mmol/L (ref 135–145)
Total Bilirubin: 0.5 mg/dL (ref 0.3–1.2)
Total Protein: 6.7 g/dL (ref 6.5–8.1)

## 2022-03-30 LAB — CBC
HCT: 40.7 % (ref 39.0–52.0)
Hemoglobin: 13.7 g/dL (ref 13.0–17.0)
MCH: 34.2 pg — ABNORMAL HIGH (ref 26.0–34.0)
MCHC: 33.7 g/dL (ref 30.0–36.0)
MCV: 101.5 fL — ABNORMAL HIGH (ref 80.0–100.0)
Platelets: 218 10*3/uL (ref 150–400)
RBC: 4.01 MIL/uL — ABNORMAL LOW (ref 4.22–5.81)
RDW: 13.4 % (ref 11.5–15.5)
WBC: 12.9 10*3/uL — ABNORMAL HIGH (ref 4.0–10.5)
nRBC: 0 % (ref 0.0–0.2)

## 2022-03-30 LAB — STREP PNEUMONIAE URINARY ANTIGEN: Strep Pneumo Urinary Antigen: POSITIVE — AB

## 2022-03-30 LAB — LACTIC ACID, PLASMA: Lactic Acid, Venous: 1.9 mmol/L (ref 0.5–1.9)

## 2022-03-30 LAB — HIV ANTIBODY (ROUTINE TESTING W REFLEX): HIV Screen 4th Generation wRfx: NONREACTIVE

## 2022-03-30 MED ORDER — LEVOTHYROXINE SODIUM 25 MCG PO TABS
25.0000 ug | ORAL_TABLET | Freq: Every day | ORAL | Status: DC
  Administered 2022-03-30 – 2022-04-03 (×5): 25 ug via ORAL
  Filled 2022-03-30 (×5): qty 1

## 2022-03-30 MED ORDER — COENZYME Q10 300 MG PO CAPS: 300.0000 mg | ORAL_CAPSULE | Freq: Every day | ORAL | Status: DC

## 2022-03-30 MED ORDER — LIP MEDEX EX OINT
TOPICAL_OINTMENT | CUTANEOUS | Status: DC | PRN
  Filled 2022-03-30: qty 7

## 2022-03-30 MED ORDER — VITAMIN D 25 MCG (1000 UNIT) PO TABS
1000.0000 [IU] | ORAL_TABLET | Freq: Every day | ORAL | Status: DC
  Administered 2022-03-31 – 2022-04-02 (×3): 1000 [IU] via ORAL
  Filled 2022-03-30 (×4): qty 1

## 2022-03-30 MED ORDER — OMEGA-3-ACID ETHYL ESTERS 1 G PO CAPS
1.0000 g | ORAL_CAPSULE | Freq: Every day | ORAL | Status: DC
  Administered 2022-03-31 – 2022-04-01 (×2): 1 g via ORAL
  Filled 2022-03-30 (×4): qty 1

## 2022-03-30 MED ORDER — LAMOTRIGINE 100 MG PO TABS
100.0000 mg | ORAL_TABLET | Freq: Every day | ORAL | Status: DC
  Administered 2022-03-30: 100 mg via ORAL
  Filled 2022-03-30: qty 1

## 2022-03-30 MED ORDER — MIRTAZAPINE 15 MG PO TABS
7.5000 mg | ORAL_TABLET | Freq: Every day | ORAL | Status: DC
  Administered 2022-03-30 – 2022-04-02 (×4): 7.5 mg via ORAL
  Filled 2022-03-30 (×4): qty 1

## 2022-03-30 MED ORDER — ZINC SULFATE 220 (50 ZN) MG PO CAPS
220.0000 mg | ORAL_CAPSULE | Freq: Every day | ORAL | Status: DC
  Administered 2022-03-31 – 2022-04-02 (×3): 220 mg via ORAL
  Filled 2022-03-30 (×4): qty 1

## 2022-03-30 MED ORDER — IPRATROPIUM-ALBUTEROL 0.5-2.5 (3) MG/3ML IN SOLN
3.0000 mL | Freq: Four times a day (QID) | RESPIRATORY_TRACT | Status: DC | PRN
Start: 2022-03-30 — End: 2022-04-03
  Administered 2022-03-30 – 2022-04-03 (×5): 3 mL via RESPIRATORY_TRACT
  Filled 2022-03-30 (×5): qty 3

## 2022-03-30 MED ORDER — EZETIMIBE 10 MG PO TABS
10.0000 mg | ORAL_TABLET | Freq: Every day | ORAL | Status: DC
  Administered 2022-03-30 – 2022-04-02 (×4): 10 mg via ORAL
  Filled 2022-03-30 (×4): qty 1

## 2022-03-30 MED ORDER — CALCIUM CARBONATE ANTACID 500 MG PO CHEW
1.0000 | CHEWABLE_TABLET | Freq: Every day | ORAL | Status: DC
  Administered 2022-03-31 – 2022-04-02 (×3): 200 mg via ORAL
  Filled 2022-03-30 (×4): qty 1

## 2022-03-30 MED ORDER — RISAQUAD PO CAPS
1.0000 | ORAL_CAPSULE | Freq: Every day | ORAL | Status: DC
  Administered 2022-03-30 – 2022-04-02 (×4): 1 via ORAL
  Filled 2022-03-30 (×4): qty 1

## 2022-03-30 MED ORDER — OMEGA-3 FATTY ACIDS 1000 MG PO CAPS: 1.0000 | ORAL_CAPSULE | Freq: Every day | ORAL | Status: DC

## 2022-03-30 MED ORDER — LAMOTRIGINE 100 MG PO TABS
100.0000 mg | ORAL_TABLET | Freq: Two times a day (BID) | ORAL | Status: DC
  Administered 2022-03-30 – 2022-04-03 (×8): 100 mg via ORAL
  Filled 2022-03-30 (×8): qty 1

## 2022-03-30 MED ORDER — ASCORBIC ACID 500 MG PO TABS
500.0000 mg | ORAL_TABLET | Freq: Every day | ORAL | Status: DC
  Administered 2022-03-31 – 2022-04-03 (×4): 500 mg via ORAL
  Filled 2022-03-30 (×5): qty 1

## 2022-03-30 MED ORDER — ACETAMINOPHEN 325 MG PO TABS
650.0000 mg | ORAL_TABLET | Freq: Four times a day (QID) | ORAL | Status: DC | PRN
Start: 2022-03-30 — End: 2022-04-03
  Administered 2022-03-30 – 2022-04-01 (×2): 650 mg via ORAL
  Filled 2022-03-30 (×3): qty 2

## 2022-03-30 MED ORDER — RIVAROXABAN 20 MG PO TABS
20.0000 mg | ORAL_TABLET | Freq: Every day | ORAL | Status: DC
  Administered 2022-03-30 – 2022-04-02 (×4): 20 mg via ORAL
  Filled 2022-03-30 (×4): qty 1

## 2022-03-30 MED ORDER — SODIUM CHLORIDE 0.9 % IV SOLN
2.0000 g | INTRAVENOUS | Status: DC
  Administered 2022-03-30 – 2022-04-02 (×4): 2 g via INTRAVENOUS
  Filled 2022-03-30 (×4): qty 20

## 2022-03-30 MED ORDER — DM-GUAIFENESIN ER 30-600 MG PO TB12
1.0000 | ORAL_TABLET | Freq: Two times a day (BID) | ORAL | Status: AC
  Administered 2022-03-30 – 2022-03-31 (×2): 1 via ORAL
  Filled 2022-03-30 (×2): qty 1

## 2022-03-30 MED ORDER — MAGNESIUM OXIDE -MG SUPPLEMENT 400 (240 MG) MG PO TABS
200.0000 mg | ORAL_TABLET | Freq: Every day | ORAL | Status: DC
Start: 2022-03-30 — End: 2022-04-03
  Administered 2022-03-30 – 2022-04-02 (×4): 200 mg via ORAL
  Filled 2022-03-30 (×4): qty 1

## 2022-03-30 MED ORDER — FUROSEMIDE 10 MG/ML IJ SOLN
20.0000 mg | Freq: Once | INTRAMUSCULAR | Status: AC
  Administered 2022-03-30: 20 mg via INTRAVENOUS
  Filled 2022-03-30: qty 2

## 2022-03-30 MED ORDER — SODIUM CHLORIDE 0.9 % IV SOLN
500.0000 mg | INTRAVENOUS | Status: DC
  Administered 2022-03-31 (×2): 500 mg via INTRAVENOUS
  Filled 2022-03-30 (×2): qty 5

## 2022-03-30 MED ORDER — PROBIOTIC-10 PO CHEW: CHEWABLE_TABLET | Freq: Every day | ORAL | Status: DC

## 2022-03-30 MED ORDER — ORAL CARE MOUTH RINSE: 15.0000 mL | OROMUCOSAL | Status: DC | PRN

## 2022-03-30 MED ORDER — CALCIUM MAGNESIUM ZINC 333-133-5 MG PO TABS: ORAL_TABLET | Freq: Every day | ORAL | Status: DC

## 2022-03-30 MED ORDER — SODIUM CHLORIDE 0.9 % IV SOLN: INTRAVENOUS | Status: DC

## 2022-03-30 NOTE — Progress Notes (Signed)
Pt with choking episode with dinner with just small bites of food. Pt was also attempting to clear mucus at the time as well. Pt 02 sat was 90 percent on 2l. Rn increased 02 to 3l and 02 sat rose to 92-93 percent. Pt was suctioned and hob elevated as much as possible. Pt lung sounds remain congested with rhonchi. Pt has been suctioned several times with younkers throughout the day and mouth care has also been provided. Rn will contact md concerning this episode.

## 2022-03-30 NOTE — H&P (Signed)
History and Physical    Patient: Joseph Williams [id number] DOB: [birth year] DOA: 03/29/2022 DOS: the patient was seen and examined on 03/30/2022 PCP: Andi Devon, MD  Patient coming from: Home  Chief Complaint:  Chief Complaint  Patient presents with   Shortness of Breath   HPI: Joseph Williams is a 56 y.o. male with medical history significant of Down syndrome, hyperlipidemia, hypothyroidism, obstructive sleep apnea, seizure disorder, who presents to the ER with complaint of fever cough shortness of breath.  Patient lives with his parents who are giving history.  He is largely aphasic.  Was worked up in the ER.  He meets sepsis criteria including temperature of 101 with leukocytosis and sinus tachycardia.  Respiratory panel currently pending.  Chest x-ray shows right lower lobe consolidation concerning for pneumonia.  May be some mild vascular congestions.  He was notably hypoxic requiring 2 L now in the ER.  Patient therefore being admitted with sepsis due to pneumonia with new oxygen requirement.  Review of Systems: As mentioned in the history of present illness. All other systems reviewed and are negative. Past Medical History:  Diagnosis Date   Down's syndrome    H/O echocardiogram 01/12/2012   Normal left ventricular wall thickness; Left ventricular systolic function is normal;  there is trace mitral and tricuspid regurgitation. EF 74%   High cholesterol    Persistent pneumonia    Past Surgical History:  Procedure Laterality Date   EYE SURGERY  04/2005   HERNIA REPAIR  10/06/2004   bilateal inguinal and ventral   Social History:  reports that he has never smoked. He has never used smokeless tobacco. He reports that he does not drink alcohol and does not use drugs.  Allergies  Allergen Reactions   Keppra [Levetiracetam] Other (See Comments)    "Make him like a zombie"   Lacosamide Other (See Comments)    "Makes him very combative"    Family History  Problem  Relation Age of Onset   Heart attack Father    Breast cancer Mother        x 3   Throat cancer Maternal Grandfather     Prior to Admission medications   Medication Sig Start Date End Date Taking? Authorizing Provider  Ascorbic Acid (VITAMIN C) 500 MG CAPS Take 500 mg by mouth daily. Take 1 tablet by mouth once daily   Yes [provider]  B Complex Vitamins (B COMPLEX 1 PO) Take 1 tablet by mouth daily.   Yes [provider]  CALCIUM MAGNESIUM ZINC PO Take 1 capsule by mouth daily.   Yes [provider]  cholecalciferol (VITAMIN D) 1000 UNITS tablet Take 1,000 Units by mouth daily.   Yes [provider]  Coenzyme Q10 300 MG CAPS Take 300 mg by mouth daily.   Yes [provider]  ezetimibe (ZETIA) 10 MG tablet Take 10 mg by mouth daily.   Yes [provider]  fish oil-omega-3 fatty acids 1000 MG capsule Take 1 capsule by mouth daily. Take 1 capsule by mouth once daily   Yes [provider]  lamoTRIgine (LAMICTAL) 25 MG tablet Take 25mg  daily for 2 weeks; then take 25mg  twice a day for 2 weeks; then take 50mg  twice a day for 2 weeks; then take 75mg  twice a day for 2 weeks; then 100mg  twice a day Patient taking differently: Take 100 mg by mouth daily. 05/28/21  Yes Penumalli, Glenford Bayley, MD  levothyroxine (SYNTHROID) 25 MCG tablet Take 25  mcg by mouth every morning. 03/15/22  Yes [provider]  mirtazapine (REMERON) 7.5 MG tablet Take 7.5 mg by mouth at bedtime. 03/08/22  Yes [provider]  Multiple Minerals (CALCIUM-MAGNESIUM-ZINC) TABS Take by mouth.   Yes [provider]  Probiotic Product (PROBIOTIC-10 PO) Take 1 capsule by mouth daily.   Yes [provider]  rivaroxaban (XARELTO) 20 MG TABS tablet Take 20 mg by mouth daily with supper.   Yes [provider]    Physical Exam: Vitals:   03/29/22 2222 03/29/22 2227 03/29/22 2228 03/29/22 2330  BP:  125/86  120/78  Pulse:  (!) 106  (!)  106  Resp:  (!) 22  (!) 24  Temp:  (!) 101.1 F (38.4 C)    TempSrc:  Oral    SpO2: 98% 96%  95%  Weight:   99.8 kg   Height:   5\' 2"  (1.575 m)    Generally: Chronically ill looking, developmental delay, HEENT: Microcephaly, PERRLA, EOMI Neck: Supple, no JVD no lymphadenopathy Respiratory: Decreased air entry at the bases, diffuse rhonchi no wheeze Cardiovascular system: Sinus tachycardia Abdomen: Soft, nontender with positive bowel sounds Extremities: No edema cyanosis or clubbing Skin exam: No significant rashes or ulcers Musculoskeletal: No joint swelling or tenderness.  Range of motion appears to be normal Neuro exam: Evidence of Down syndrome, patient not communicating well, appears disoriented.  Data Reviewed:  Chemistry largely within normal, albumin 3.3, white count 11.7, PT 22.1 INR 2.0, chest x-ray showed right lower lobe consolidation concerning for pneumonia  Assessment and Plan:  #1 sepsis due to pneumonia: Patient will be admitted and hydrated.  2 sepsis protocol.  We will be moderate to the fluids due to his mild fluids in the lungs.  Monitor closely.  Last echocardiogram was years ago.  This is also suspicious for aspiration pneumonia based on the site being right lower lobe.  May require speech therapy evaluation prior to discharge.  #2 Down syndrome: Chronic.  Stable.  Continue monitoring  #3 sinus tachycardia: Secondary to pneumonia and sepsis.  Continue to monitor  #4 acute hypoxic respiratory failure: Patient requiring oxygen at the moment.  Secondary to sepsis and pneumonia.  Continue to monitor  #5 hypothyroidism: Continue home dose of levothyroxine  #6 obstructive sleep apnea: Continue CPAP if tolerated  #7 history of DVT: On chronic anticoagulation.  Continue Xarelto  #8 seizure disorder: Confirm on continue home regimen.  Seizure precautions.     Advance Care Planning:   Code Status: Prior full code  Consults: None  Family Communication:  Mother at bedside  Severity of Illness: The appropriate patient status for this patient is INPATIENT. Inpatient status is judged to be reasonable and necessary in order to provide the required intensity of service to ensure the patient's safety. The patient's presenting symptoms, physical exam findings, and initial radiographic and laboratory data in the context of their chronic comorbidities is felt to place them at high risk for further clinical deterioration. Furthermore, it is not anticipated that the patient will be medically stable for discharge from the hospital within 2 midnights of admission.   * I certify that at the point of admission it is my clinical judgment that the patient will require inpatient hospital care spanning beyond 2 midnights from the point of admission due to high intensity of service, high risk for further deterioration and high frequency of surveillance required.*  AuthorLonia Blood, MD 03/30/2022 12:22 AM  For on call review [email or web address].

## 2022-03-31 ENCOUNTER — Inpatient Hospital Stay (HOSPITAL_COMMUNITY): Payer: Medicare Other

## 2022-03-31 DIAGNOSIS — J9601 Acute respiratory failure with hypoxia: Secondary | ICD-10-CM | POA: Diagnosis not present

## 2022-03-31 DIAGNOSIS — Q909 Down syndrome, unspecified: Secondary | ICD-10-CM | POA: Diagnosis not present

## 2022-03-31 DIAGNOSIS — E039 Hypothyroidism, unspecified: Secondary | ICD-10-CM | POA: Diagnosis not present

## 2022-03-31 DIAGNOSIS — J189 Pneumonia, unspecified organism: Secondary | ICD-10-CM | POA: Diagnosis not present

## 2022-03-31 LAB — CBC
HCT: 40.5 % (ref 39.0–52.0)
Hemoglobin: 13.1 g/dL (ref 13.0–17.0)
MCH: 33.3 pg (ref 26.0–34.0)
MCHC: 32.3 g/dL (ref 30.0–36.0)
MCV: 103.1 fL — ABNORMAL HIGH (ref 80.0–100.0)
Platelets: 207 10*3/uL (ref 150–400)
RBC: 3.93 MIL/uL — ABNORMAL LOW (ref 4.22–5.81)
RDW: 13.8 % (ref 11.5–15.5)
WBC: 8.3 10*3/uL (ref 4.0–10.5)
nRBC: 0 % (ref 0.0–0.2)

## 2022-03-31 LAB — BASIC METABOLIC PANEL
Anion gap: 6 (ref 5–15)
BUN: 12 mg/dL (ref 6–20)
CO2: 28 mmol/L (ref 22–32)
Calcium: 7.8 mg/dL — ABNORMAL LOW (ref 8.9–10.3)
Chloride: 110 mmol/L (ref 98–111)
Creatinine, Ser: 1.04 mg/dL (ref 0.61–1.24)
GFR, Estimated: >60 mL/min (ref >60)
Glucose, Bld: 100 mg/dL — ABNORMAL HIGH (ref 70–99)
Potassium: 3.6 mmol/L (ref 3.5–5.1)
Sodium: 144 mmol/L (ref 135–145)

## 2022-03-31 LAB — BRAIN NATRIURETIC PEPTIDE: B Natriuretic Peptide: 95 pg/mL (ref 0.0–100.0)

## 2022-03-31 MED ORDER — IPRATROPIUM-ALBUTEROL 0.5-2.5 (3) MG/3ML IN SOLN
3.0000 mL | Freq: Three times a day (TID) | RESPIRATORY_TRACT | Status: DC
Start: 2022-03-31 — End: 2022-04-03
  Administered 2022-03-31 – 2022-04-03 (×9): 3 mL via RESPIRATORY_TRACT
  Filled 2022-03-31 (×11): qty 3

## 2022-03-31 MED ORDER — ORAL CARE MOUTH RINSE
15.0000 mL | OROMUCOSAL | Status: DC
Start: 2022-03-31 — End: 2022-03-31

## 2022-03-31 MED ORDER — GUAIFENESIN ER 600 MG PO TB12
600.0000 mg | ORAL_TABLET | Freq: Two times a day (BID) | ORAL | Status: DC
  Administered 2022-03-31 – 2022-04-02 (×6): 600 mg via ORAL
  Filled 2022-03-31 (×6): qty 1

## 2022-03-31 MED ORDER — POTASSIUM CHLORIDE 20 MEQ PO PACK
40.0000 meq | PACK | Freq: Once | ORAL | Status: AC
Start: 2022-03-31 — End: 2022-03-31
  Administered 2022-03-31: 40 meq via ORAL
  Filled 2022-03-31: qty 2

## 2022-03-31 MED ORDER — GUAIFENESIN-DM 100-10 MG/5ML PO SYRP
5.0000 mL | ORAL_SOLUTION | ORAL | Status: DC | PRN
  Administered 2022-04-01 (×2): 5 mL via ORAL
  Filled 2022-03-31 (×2): qty 5

## 2022-03-31 MED ORDER — ORAL CARE MOUTH RINSE: 15.0000 mL | OROMUCOSAL | Status: DC | PRN

## 2022-03-31 NOTE — Progress Notes (Addendum)
Physical Therapy Treatment Patient Details Name: Joseph Williams MRN: [id number] DOB: September 15, 1966 Today's Date: 03/31/2022   History of Present Illness 56 y.o. male with medical history significant of Down syndrome, hyperlipidemia, hypothyroidism, obstructive sleep apnea, seizure disorder, who presented to the ER with complaint of fever cough shortness of breath.  Patient lives with his mother.  He was noted to have leukocytosis. Chest x-ray raised concern for right lower lobe pneumonia.  He was also noted to be hypoxic    PT Comments    Mod assist for supine to sit, min to mod assist of 2 for sit to stand x 3 trials with RW. Pt was able to stand for ~90 seconds for cleanup of BM, then took several pivotal steps to recliner with RW. SaO2 84% on 4L O2 with activity, 3/4 dyspnea. Pt had difficulty following commands to inhale through his nose. At baseline, his mother provides stand by assist when pt ambulates with his rollator due to h/o falls.    Recommendations for follow up therapy are one component of a multi-disciplinary discharge planning process, led by the attending physician.  Recommendations may be updated based on patient status, additional functional criteria and insurance authorization.  Follow Up Recommendations  Home health PT     Assistance Recommended at Discharge Frequent or constant Supervision/Assistance  Patient can return home with the following A little help with walking and/or transfers;A lot of help with bathing/dressing/bathroom;Assistance with cooking/housework;Assist for transportation;Help with stairs or ramp for entrance;Direct supervision/assist for financial management   Equipment Recommendations  None recommended by PT    Recommendations for Other Services       Precautions / Restrictions Precautions Precautions: Fall Precaution Comments: requires +1 assist for mobility at baseline Restrictions Weight Bearing Restrictions: No     Mobility  Bed  Mobility Overal bed mobility: Needs Assistance Bed Mobility: Supine to Sit     Supine to sit: Mod assist, HOB elevated     General bed mobility comments: assist to raise trunk    Transfers Overall transfer level: Needs assistance Equipment used: Rolling walker (2 wheels) Transfers: Sit to/from Stand, Bed to chair/wheelchair/BSC Sit to Stand: +2 safety/equipment, From elevated surface, Min assist   Step pivot transfers: Min assist, +2 safety/equipment       General transfer comment: sit to stand x 3 trials with RW, manual cues for hand placement, pt stood for ~90 seconds for pericare; SaO2 84% on 4L O2 with SPT, 3/4 dyspnea, VCs for pursed lip breathing however pt demonstrated limited ability to inhale through nose    Ambulation/Gait                   Stairs             Wheelchair Mobility    Modified Rankin (Stroke Patients Only)       Balance Overall balance assessment: Needs assistance, History of Falls Sitting-balance support: Feet supported, No upper extremity supported Sitting balance-Leahy Scale: Fair     Standing balance support: Bilateral upper extremity supported Standing balance-Leahy Scale: Poor Standing balance comment: relies on BUE support                            Cognition Arousal/Alertness: Awake/alert Behavior During Therapy: WFL for tasks assessed/performed Overall Cognitive Status: History of cognitive impairments - at baseline  General Comments: pt able to follow 1 step commands with increased time and multimodal cues, his mother provided PLOF        Exercises      General Comments        Pertinent Vitals/Pain Pain Assessment Pain Assessment: No/denies pain Faces Pain Scale: No hurt    Home Living Family/patient expects to be discharged to:: Private residence Living Arrangements: Parent Available Help at Discharge: Family Type of Home: House Home  Access: Ramped entrance       Home Layout: Able to live on main level with bedroom/bathroom Home Equipment: Rollator (4 wheels);Rolling Walker (2 wheels);Shower seat;Hand held shower head Additional Comments: uses a rollator since Sept    Prior Function            PT Goals (current goals can now be found in the care plan section) Acute Rehab PT Goals Patient Stated Goal: to be able to walk PT Goal Formulation: With family Time For Goal Achievement: 04/14/22 Potential to Achieve Goals: Good    Frequency    Min 3X/week      PT Plan      Co-evaluation              AM-PAC PT "6 Clicks" Mobility   Outcome Measure  Help needed turning from your back to your side while in a flat bed without using bedrails?: A Little Help needed moving from lying on your back to sitting on the side of a flat bed without using bedrails?: A Lot Help needed moving to and from a bed to a chair (including a wheelchair)?: A Lot Help needed standing up from a chair using your arms (e.g., wheelchair or bedside chair)?: A Lot Help needed to walk in hospital room?: A Lot Help needed climbing 3-5 steps with a railing? : Total 6 Click Score: 12    End of Session Equipment Utilized During Treatment: Gait belt;Oxygen Activity Tolerance: Patient tolerated treatment well Patient left: in chair;with family/visitor present;with call bell/phone within reach;with nursing/sitter in room Nurse Communication: Mobility status PT Visit Diagnosis: Difficulty in walking, not elsewhere classified (R26.2);History of falling (Z91.81)     Time: 1028-1100 PT Time Calculation (min) (ACUTE ONLY): 32 min  Charges:  $Therapeutic Activity: 23-37 mins                    Ralene Bathe Kistler PT 03/31/2022  Acute Rehabilitation Services  Office [phone]

## 2022-03-31 NOTE — Progress Notes (Signed)
Modified Barium Swallow Progress Note  Patient Details  Name: Joseph Williams MRN: [id number] Date of Birth: 07-01-1966  Today's Date: 03/31/2022  Modified Barium Swallow completed.  Full report located under Chart Review in the Imaging Section.  Brief recommendations include the following:  Clinical Impression  Patient presents with a mild oral and a mod-severe pharyngeal phase dysphagia as per this MBS. During oral phase, he exhibited mild amount of decreased bolus control and piecemeal swallowing with portion of bolus entering high palatal arch before transiting out of oral cavity. During pharyngeal phase, patient exhibited silent aspiration of moderate amount with thin liquids via straw sip which occured before the swallow. When thin liquids and nectar thick liquids were administered via cup sips, patient exhibited silent aspiration during swallow with amount of aspirate being reduced as compared to straw sips of thin liquid. Patient tolerated spoon sips of thin and nectar thick liquids without penetration or aspiration occuring before or during the swallow, but with trace amount of aspiration occuring after the swallow from pyriform residuals. With honey thick liquids and puree solids, patient did not exhibit any penetration or aspiration however these consistencies did result in vallecular, pyriform and posterior pharyngeal wall residuals which would slowly but not completely clear with subsequent swallows. SLP did observe interference on esophagus from cervical spine (no radiologist present to confirm) causing narrowing and leading to reduced bolus transit and likely pharyngeal residuals. As patient exhibited moderate amount of aspiration with thin liquids and aspirations with nectar thick as well (all being silent aspiration), highly suspect that his current PNA is secondary to or at least significantly impacted by aspiration of liquid PO's. SLP recommending to initiate conservative diet of Dys  1(puree) solids and honey thick liquids with plan to work with patient and family to determine least restrictive oral diet to maximize PO intake and minimize aspiration risk in the longer term.   Swallow Evaluation Recommendations       SLP Diet Recommendations: Dysphagia 1 (Puree) solids;Honey thick liquids   Liquid Administration via: Cup;Spoon;No straw   Medication Administration: Whole meds with puree   Supervision: Full supervision/cueing for compensatory strategies;Staff to assist with self feeding   Compensations: Minimize environmental distractions;Slow rate;Small sips/bites   Postural Changes: Seated upright at 90 degrees   Oral Care Recommendations: Oral care BID;Oral care before and after PO;Staff/trained caregiver to provide oral care       Angela Nevin, MA, CCC-SLP Speech Therapy

## 2022-03-31 NOTE — Progress Notes (Signed)
Speech Language Pathology Treatment: Dysphagia  Patient Details Name: Joseph Williams MRN: [id number] DOB: Jul 07, 1966 Today's Date: 03/31/2022 Time: 1445-1500 SLP Time Calculation (min) (ACUTE ONLY): 15 min  Assessment / Plan / Recommendation Clinical Impression  Patient seen by SLP for skilled treatment session focused on dysphagia goals and education of Mom regarding MBS results. SLP informed patient's mother of the aspiration that was observed on MBS which was silent as well as a moderate amount with thin liquids. SLP showed part of MBS films highlighting the aspiration which occurred.  Mom asking about food consistencies such as gelatin and SLP explained why this was not safe at this time due to it melting to thin liquids when eaten. SLP then showed patient's mother how to thicken drinks to honey thick consistency and observed her and patient as she gave him spoon sips of honey thick liquids. SLP explained that at this time, SLP is recommending a conservative diet of Dys 1, honey thick liquids to reduce aspiration risk as patient recovers from current PNA but will work on a longer term, less restrictive diet to balance minimizing aspiration risk and maximizing PO intake and hydration. SLP will continue to follow patient for dysphagia goals.     HPI HPI: Patient is a 56 y.o. male with PMH: Down Syndrome, HLD, hypothyroidism, OSA, seizure disorder. He presented to the ER on 03/29/22 with c/o fever, cough, SOB. CXR was concerning for RLL PNA and patient was also hypoxic. He had a reported choking episode while eating on 6/25 prompting diet downgrade to puree solids and SLP swallow evaluation order.      SLP Plan  Continue with current plan of care      Recommendations for follow up therapy are one component of a multi-disciplinary discharge planning process, led by the attending physician.  Recommendations may be updated based on patient status, additional functional criteria and insurance  authorization.    Recommendations  Diet recommendations: Dysphagia 1 (puree);Honey-thick liquid Liquids provided via: Teaspoon;Cup Medication Administration: Whole meds with puree Supervision: Full supervision/cueing for compensatory strategies;Trained caregiver to feed patient;Staff to assist with self feeding Compensations: Minimize environmental distractions;Slow rate;Small sips/bites Postural Changes and/or Swallow Maneuvers: Seated upright 90 degrees;Upright 30-60 min after meal                Oral Care Recommendations: Oral care BID;Oral care before and after PO;Staff/trained caregiver to provide oral care Follow Up Recommendations: Follow physician's recommendations for discharge plan and follow up therapies Assistance recommended at discharge: Frequent or constant Supervision/Assistance SLP Visit Diagnosis: Dysphagia, oropharyngeal phase (R13.12) Plan: Continue with current plan of care           Angela Nevin, MA, CCC-SLP Speech Therapy

## 2022-04-01 DIAGNOSIS — Z515 Encounter for palliative care: Secondary | ICD-10-CM | POA: Diagnosis not present

## 2022-04-01 DIAGNOSIS — J189 Pneumonia, unspecified organism: Secondary | ICD-10-CM | POA: Diagnosis not present

## 2022-04-01 DIAGNOSIS — Q909 Down syndrome, unspecified: Secondary | ICD-10-CM | POA: Diagnosis not present

## 2022-04-01 DIAGNOSIS — E039 Hypothyroidism, unspecified: Secondary | ICD-10-CM | POA: Diagnosis not present

## 2022-04-01 DIAGNOSIS — Z7189 Other specified counseling: Secondary | ICD-10-CM | POA: Diagnosis not present

## 2022-04-01 DIAGNOSIS — J9601 Acute respiratory failure with hypoxia: Secondary | ICD-10-CM | POA: Diagnosis not present

## 2022-04-01 LAB — URINE CULTURE: Culture: 20000 — AB

## 2022-04-01 MED ORDER — AZITHROMYCIN 250 MG PO TABS
500.0000 mg | ORAL_TABLET | Freq: Every day | ORAL | Status: AC
  Administered 2022-04-01 – 2022-04-02 (×2): 500 mg via ORAL
  Filled 2022-04-01 (×2): qty 2

## 2022-04-01 MED ORDER — SCOPOLAMINE 1 MG/3DAYS TD PT72
1.0000 | MEDICATED_PATCH | TRANSDERMAL | Status: DC
Start: 2022-04-01 — End: 2022-04-03
  Administered 2022-04-01: 1.5 mg via TRANSDERMAL
  Filled 2022-04-01: qty 1

## 2022-04-01 MED ORDER — FUROSEMIDE 10 MG/ML IJ SOLN
40.0000 mg | Freq: Once | INTRAMUSCULAR | Status: AC
  Administered 2022-04-01: 40 mg via INTRAVENOUS
  Filled 2022-04-01: qty 4

## 2022-04-02 ENCOUNTER — Inpatient Hospital Stay (HOSPITAL_COMMUNITY): Payer: Medicare Other

## 2022-04-02 DIAGNOSIS — R0602 Shortness of breath: Secondary | ICD-10-CM

## 2022-04-02 DIAGNOSIS — Z86718 Personal history of other venous thrombosis and embolism: Secondary | ICD-10-CM

## 2022-04-02 DIAGNOSIS — J9601 Acute respiratory failure with hypoxia: Secondary | ICD-10-CM

## 2022-04-02 DIAGNOSIS — R569 Unspecified convulsions: Secondary | ICD-10-CM

## 2022-04-02 DIAGNOSIS — Q909 Down syndrome, unspecified: Secondary | ICD-10-CM | POA: Diagnosis not present

## 2022-04-02 DIAGNOSIS — R531 Weakness: Secondary | ICD-10-CM

## 2022-04-02 DIAGNOSIS — Z7189 Other specified counseling: Secondary | ICD-10-CM | POA: Diagnosis not present

## 2022-04-02 DIAGNOSIS — G4733 Obstructive sleep apnea (adult) (pediatric): Secondary | ICD-10-CM

## 2022-04-02 DIAGNOSIS — J189 Pneumonia, unspecified organism: Secondary | ICD-10-CM | POA: Diagnosis not present

## 2022-04-02 DIAGNOSIS — Z515 Encounter for palliative care: Secondary | ICD-10-CM | POA: Diagnosis not present

## 2022-04-02 DIAGNOSIS — A419 Sepsis, unspecified organism: Secondary | ICD-10-CM | POA: Diagnosis not present

## 2022-04-02 LAB — BASIC METABOLIC PANEL
Anion gap: 8 (ref 5–15)
BUN: 14 mg/dL (ref 6–20)
CO2: 31 mmol/L (ref 22–32)
Calcium: 8.7 mg/dL — ABNORMAL LOW (ref 8.9–10.3)
Chloride: 105 mmol/L (ref 98–111)
Creatinine, Ser: 1.16 mg/dL (ref 0.61–1.24)
GFR, Estimated: >60 mL/min (ref >60)
Glucose, Bld: 102 mg/dL — ABNORMAL HIGH (ref 70–99)
Potassium: 3.8 mmol/L (ref 3.5–5.1)
Sodium: 144 mmol/L (ref 135–145)

## 2022-04-02 LAB — CBC
HCT: 41.5 % (ref 39.0–52.0)
Hemoglobin: 13.9 g/dL (ref 13.0–17.0)
MCH: 34.7 pg — ABNORMAL HIGH (ref 26.0–34.0)
MCHC: 33.5 g/dL (ref 30.0–36.0)
MCV: 103.5 fL — ABNORMAL HIGH (ref 80.0–100.0)
Platelets: 240 10*3/uL (ref 150–400)
RBC: 4.01 MIL/uL — ABNORMAL LOW (ref 4.22–5.81)
RDW: 13.7 % (ref 11.5–15.5)
WBC: 6.1 10*3/uL (ref 4.0–10.5)
nRBC: 0 % (ref 0.0–0.2)

## 2022-04-02 MED ORDER — FUROSEMIDE 10 MG/ML IJ SOLN
40.0000 mg | Freq: Once | INTRAMUSCULAR | Status: AC
  Administered 2022-04-02: 40 mg via INTRAVENOUS
  Filled 2022-04-02: qty 4

## 2022-04-02 MED ORDER — GLYCOPYRROLATE 0.2 MG/ML IJ SOLN
0.2000 mg | Freq: Three times a day (TID) | INTRAMUSCULAR | Status: DC
  Administered 2022-04-02 – 2022-04-03 (×4): 0.2 mg via INTRAVENOUS
  Filled 2022-04-02 (×4): qty 1

## 2022-04-02 NOTE — Progress Notes (Addendum)
Speech Language Pathology Treatment: Dysphagia  Patient Details Name: Joseph Williams MRN: [id number] DOB: 02-Jul-1966 Today's Date: 04/02/2022 Time: 2993-7169 SLP Time Calculation (min) (ACUTE ONLY): 11 min  Assessment / Plan / Recommendation Clinical Impression  SLP session to address pt's dysphagia.  Noted results of CXR from today and pt's respiratory issues.  He was on bed pan with congested breathing heard upon SLP entrance to room.  Mother present reports pt has been coughing with intake chronically - more so with "grits" or anything of "consistency" vs liquids prior to admission.  She also endorses this is his 4th pneumonia this year. Discussed with mother, multiple risk factors for asp pna including reliance on others for feeding and weak cough that does not expel aspirates.  Reviewed MBS flouro loops that showed more retention in pharynx with increased viscocity.  Concerns verbalized to pt's mother that pt may not be tolerating modified diet and if HTL/Pureed is aspirated, pulmonary clearance more impaired than with nectar.  Mother reports pt continues to cough with intake - thus modified diet does is likely not preventing aspiration.  Did not observe pt with po as he was on bed pan, but given pt did not aspirate with nectar or thin via tsp, recommend to change diet to liquids only (*nectar via tsp only). Also recommend thin water via tsp after oral care and between meals.  Discussed risks of long term feeding tubes may outweigh benefits and Joseph Williams is aspirating secretions.  Pt's mother reported several risks of PEGS including diarrhea, tube feeding, reflux risk, that are not desirable.   Pt's mother is a retired [employer] scientist therefore she has a good understanding of pt's status/dysphagia.  Mother agreeable to diet change to full liquid - via tsp - no grits, pudding, oatmeal; with strict precautions.  Palliaitve meeting with pt's family tomorrow.    MD and RN aware and MD approved of diet order  change. Will follow up re: pt's dysphagia dependent on GOC.    HPI HPI: Patient is a 56 y.o. male with PMH: Down Syndrome, HLD, hypothyroidism, OSA, seizure disorder. He presented to the ER on 03/29/22 with c/o fever, cough, SOB. CXR was concerning for RLL PNA and patient was also hypoxic. He had a reported choking episode while eating on 6/25 prompting diet downgrade to puree solids and SLP swallow evaluation order.      SLP Plan  Continue with current plan of care      Recommendations for follow up therapy are one component of a multi-disciplinary discharge planning process, led by the attending physician.  Recommendations may be updated based on patient status, additional functional criteria and insurance authorization.    Recommendations  Diet recommendations: Nectar-thick liquid (thin water between meals ok after oral care) Liquids provided via: Teaspoon Medication Administration: Other (Comment) (crushed if not contraindicated) Supervision: Full supervision/cueing for compensatory strategies (pt's mother education to swallow precautions, feeding strategy) Compensations: Minimize environmental distractions;Slow rate;Small sips/bites Postural Changes and/or Swallow Maneuvers: Seated upright 90 degrees;Upright 30-60 min after meal                Oral Care Recommendations: Oral care BID;Oral care before and after PO;Staff/trained caregiver to provide oral care Follow Up Recommendations: Follow physician's recommendations for discharge plan and follow up therapies Assistance recommended at discharge: Frequent or constant Supervision/Assistance SLP Visit Diagnosis: Dysphagia, oropharyngeal phase (R13.12) Plan: Continue with current plan of care         Joseph Lime, MS [place] Office  [place] Pager [phone]   Joseph Williams  04/02/2022, 3:23 PM

## 2022-04-02 NOTE — Progress Notes (Signed)
PROGRESS NOTE    Joseph [place]  IDP:[id number] DOB: 1966-06-28 DOA: 03/29/2022 PCP: Willey Blade, MD   Brief Narrative:  56 y.o. male with medical history significant of Down syndrome, hyperlipidemia, hypothyroidism, obstructive sleep apnea, seizure disorder presented with fever, cough and shortness of breath. He was found to have right lobe pneumonia along with hypoxia for which he was started on iv antibiotics.  Assessment & Plan:   Acute respiratory failure with hypoxia Possible aspiration pneumonia Sepsis: Present on admission -Respiratory status worsened on 04/01/2022 requiring more amount of oxygen.  Patient was started on glycopyrrolate because of increased secretions.  Currently on 5 L oxygen by nasal cannula.  Wean off as able. -Repeat chest x-ray today.  Patient received a dose of IV Lasix on 04/01/2022. -Continue Rocephin and Zithromax. -Influenza and COVID testing negative on admission.  Blood cultures have remained negative so far.  Urine culture grew 20,000 colonies /mL staph hemolyticus  Dysphagia -SLP following.  Diet as per SLP recommendations.  Patient is at high risk for recurrent aspiration pneumonias.  Down syndrome History of seizure disorder -Continue Lamictal.  Outpatient follow-up with neurology.  Fall precautions.  Seizure precautions.  History of DVT -Diagnosed in 2022.  Continue Xarelto.  OSA -No CPAP for now due to high aspiration risk  Goals of care -CODE STATUS has been changed to DNR.  Palliative care following.  Overall prognosis is guarded to poor.  Morbid obesity Outpatient follow-up leukocytosis -Resolved  Hypothyroidism -Continue levothyroxine  DVT prophylaxis: Xarelto Code Status: DNR Family Communication: Mother at bedside Disposition Plan: Status is: Inpatient Remains inpatient appropriate because: Of severity of illness  Consultants: Palliative care  Procedures: None  Antimicrobials:  Rocephin and Zithromax from  03/29/2022 onwards  Subjective: Patient seen and examined at bedside.  Awake, poor historian.  Mother at bedside.  Having fevers.  No seizures, agitation, vomiting reported.  Objective: Vitals:   04/01/22 2209 04/02/22 0044 04/02/22 0542 04/02/22 0754  BP:   119/65   Pulse:   78   Resp:   20   Temp: (!) 100.8 F (38.2 C) 99.4 F (37.4 C) 98.5 F (36.9 C)   TempSrc:  Oral Oral   SpO2:   95% 90%  Weight:      Height:        Intake/Output Summary (Last 24 hours) at 04/02/2022 1110 Last data filed at 04/02/2022 0547 Gross per 24 hour  Intake --  Output 2650 ml  Net -2650 ml   Filed Weights   03/29/22 2228 03/30/22 0214  Weight: 99.8 kg 104.9 kg    Examination:  General exam: Appears calm and comfortable.  Currently on 5 L oxygen via nasal cannula.  Looks chronically ill and deconditioned.  No distress. Respiratory system: Bilateral decreased breath sounds at bases with scattered crackles Cardiovascular system: S1 & S2 heard, Rate controlled Gastrointestinal system: Abdomen is morbidly obese, nondistended, soft and nontender. Normal bowel sounds heard. Extremities: No cyanosis, clubbing; trace lower extremity edema Central nervous system: Alert, extremely slow to respond, poor historian.  No focal neurological deficits. Moving extremities Skin: No rashes, lesions or ulcers Psychiatry: Could not be assessed because of mental status   Data Reviewed: I have personally reviewed following labs and imaging studies  CBC: Recent Labs  Lab 03/29/22 2259 03/30/22 0539 03/31/22 0524 04/02/22 0545  WBC 11.7* 12.9* 8.3 6.1  NEUTROABS 10.1*  --   --   --   HGB 15.1 13.7 13.1 13.9  HCT 44.5 40.7 40.5 41.5  MCV 100.9* 101.5* 103.1* 103.5*  PLT 219 218 207 297   Basic Metabolic Panel: Recent Labs  Lab 03/29/22 2259 03/30/22 0539 03/31/22 0524 04/02/22 0545  NA 142 143 144 144  K 3.8 4.1 3.6 3.8  CL 108 111 110 105  CO2 '29 25 28 31  '$ GLUCOSE 116* 162* 100* 102*  BUN '12  11 12 14  '$ CREATININE 1.17 0.98 1.04 1.16  CALCIUM 8.8* 8.2* 7.8* 8.7*   GFR: Estimated Creatinine Clearance: 75 mL/min (by C-G formula based on SCr of 1.16 mg/dL). Liver Function Tests: Recent Labs  Lab 03/29/22 2259 03/30/22 0539  AST 29 32  ALT 22 25  ALKPHOS 84 72  BILITOT 0.7 0.5  PROT 7.4 6.7  ALBUMIN 3.3* 2.9*   No results for input(s): "LIPASE", "AMYLASE" in the last 168 hours. No results for input(s): "AMMONIA" in the last 168 hours. Coagulation Profile: Recent Labs  Lab 03/29/22 2259  INR 2.0*   Cardiac Enzymes: No results for input(s): "CKTOTAL", "CKMB", "CKMBINDEX", "TROPONINI" in the last 168 hours. BNP (last 3 results) No results for input(s): "PROBNP" in the last 8760 hours. HbA1C: No results for input(s): "HGBA1C" in the last 72 hours. CBG: No results for input(s): "GLUCAP" in the last 168 hours. Lipid Profile: No results for input(s): "CHOL", "HDL", "LDLCALC", "TRIG", "CHOLHDL", "LDLDIRECT" in the last 72 hours. Thyroid Function Tests: No results for input(s): "TSH", "T4TOTAL", "FREET4", "T3FREE", "THYROIDAB" in the last 72 hours. Anemia Panel: No results for input(s): "VITAMINB12", "FOLATE", "FERRITIN", "TIBC", "IRON", "RETICCTPCT" in the last 72 hours. Sepsis Labs: Recent Labs  Lab 03/29/22 2259 03/30/22 0115  LATICACIDVEN 1.8 1.9    Recent Results (from the past 240 hour(s))  Resp Panel by RT-PCR (Flu A&B, Covid) Anterior Nasal Swab     Status: None   Collection Time: 03/29/22 12:05 AM   Specimen: Anterior Nasal Swab  Result Value Ref Range Status   SARS Coronavirus 2 by RT PCR NEGATIVE NEGATIVE Final    Comment: (NOTE) SARS-CoV-2 target nucleic acids are NOT DETECTED.  The SARS-CoV-2 RNA is generally detectable in upper respiratory specimens during the acute phase of infection. The lowest concentration of SARS-CoV-2 viral copies this assay can detect is 138 copies/mL. A negative result does not preclude SARS-Cov-2 infection and should  not be used as the sole basis for treatment or other patient management decisions. A negative result may occur with  improper specimen collection/handling, submission of specimen other than nasopharyngeal swab, presence of viral mutation(s) within the areas targeted by this assay, and inadequate number of viral copies(<138 copies/mL). A negative result must be combined with clinical observations, patient history, and epidemiological information. The expected result is Negative.  Fact Sheet for Patients:  EntrepreneurPulse.com.au  Fact Sheet for Healthcare Providers:  IncredibleEmployment.be  This test is no t yet approved or cleared by the [place] FDA and  has been authorized for detection and/or diagnosis of SARS-CoV-2 by FDA under an Emergency Use Authorization (EUA). This EUA will remain  in effect (meaning this test can be used) for the duration of the COVID-19 declaration under Section 564(b)(1) of the Act, 21 U.S.C.section 360bbb-3(b)(1), unless the authorization is terminated  or revoked sooner.       Influenza A by PCR NEGATIVE NEGATIVE Final   Influenza B by PCR NEGATIVE NEGATIVE Final    Comment: (NOTE) The Xpert Xpress SARS-CoV-2/FLU/RSV plus assay is intended as an aid in the diagnosis of influenza from Nasopharyngeal swab specimens and should not be used as  a sole basis for treatment. Nasal washings and aspirates are unacceptable for Xpert Xpress SARS-CoV-2/FLU/RSV testing.  Fact Sheet for Patients: EntrepreneurPulse.com.au  Fact Sheet for Healthcare Providers: IncredibleEmployment.be  This test is not yet approved or cleared by the [place] FDA and has been authorized for detection and/or diagnosis of SARS-CoV-2 by FDA under an Emergency Use Authorization (EUA). This EUA will remain in effect (meaning this test can be used) for the duration of the COVID-19 declaration under  Section 564(b)(1) of the Act, 21 U.S.C. section 360bbb-3(b)(1), unless the authorization is terminated or revoked.  Performed at [facility], [place] [address] [place], [place] [address]   Blood Culture (routine x 2)     Status: None (Preliminary result)   Collection Time: 03/29/22 10:45 PM   Specimen: BLOOD  Result Value Ref Range Status   Specimen Description   Final    BLOOD BLOOD LEFT ARM Performed at [facility] [address], [place], [place] [address]    Special Requests   Final    BOTTLES DRAWN AEROBIC AND ANAEROBIC Blood Culture adequate volume Performed at [facility] [address], [place], [place] [address]    Culture   Final    NO GROWTH 3 DAYS Performed at [facility], [place] [address], [place], [place] [address]    Report Status PENDING  Incomplete  Blood Culture (routine x 2)     Status: None (Preliminary result)   Collection Time: 03/29/22 11:40 PM   Specimen: BLOOD  Result Value Ref Range Status   Specimen Description   Final    BLOOD BLOOD RIGHT HAND Performed at [facility] [address] [place], [place] [address]    Special Requests   Final    BOTTLES DRAWN AEROBIC AND ANAEROBIC Blood Culture adequate volume Performed at [facility] [address] [place], [place] [address]    Culture   Final    NO GROWTH 3 DAYS Performed at [facility], [place] [address] [place], [place] [address]    Report Status PENDING  Incomplete  Urine Culture     Status: Abnormal   Collection Time: 03/30/22  3:34 AM   Specimen: In/Out Cath Urine  Result Value Ref Range Status   Specimen Description   Final    IN/OUT CATH URINE Performed at [facility] [address] [place], [place] [address]    Special Requests   Final    NONE Performed at [facility], [place] [address] [place], [place] [address]    Culture 20,000  COLONIES/mL STAPHYLOCOCCUS HAEMOLYTICUS (A)  Final   Report Status 04/01/2022 FINAL  Final   Organism ID, Bacteria STAPHYLOCOCCUS HAEMOLYTICUS (A)  Final      Susceptibility   Staphylococcus haemolyticus - MIC*    CIPROFLOXACIN >=8 RESISTANT Resistant     GENTAMICIN <=0.5 SENSITIVE Sensitive     NITROFURANTOIN <=16 SENSITIVE Sensitive     OXACILLIN >=4 RESISTANT Resistant     TETRACYCLINE >=16 RESISTANT Resistant     VANCOMYCIN 1 SENSITIVE Sensitive     TRIMETH/SULFA 80 RESISTANT Resistant     CLINDAMYCIN >=8 RESISTANT Resistant     RIFAMPIN <=0.5 SENSITIVE Sensitive     Inducible Clindamycin NEGATIVE Sensitive     * 20,000 COLONIES/mL STAPHYLOCOCCUS HAEMOLYTICUS         Radiology Studies: DG CHEST PORT 1 VIEW  Result Date: 04/02/2022 CLINICAL DATA:  Dyspnea EXAM: PORTABLE  CHEST 1 VIEW COMPARISON:  Chest radiograph 1 day prior FINDINGS: The heart is enlarged, unchanged. The upper mediastinal contours are stable. There are diffusely increased interstitial markings throughout both lungs likely reflecting pulmonary interstitial edema with small right and probable trace left bilateral effusions and adjacent bibasilar opacities. Overall, aeration is worsened compared to the study from 03/30/2022. There is no pneumothorax. There is no acute osseous abnormality. IMPRESSION: Mild pulmonary interstitial edema and small right and probable trace left pleural effusions with adjacent bibasilar airspace opacities, overall worsened since 03/30/2022. Electronically Signed   By: Valetta Mole M.D.   On: 04/02/2022 10:13   DG Swallowing Func-Speech Pathology  Result Date: 03/31/2022 Table formatting from the original result was not included. Objective Swallowing Evaluation: Type of Study: MBS-Modified Barium Swallow Study  Patient Details Name: Joseph [place] MRN: [id number] Date of Birth: 05-30-1966 Today's Date: 03/31/2022 Time: SLP Start Time (ACUTE ONLY): 1340 -SLP Stop Time (ACUTE ONLY): 1400 SLP Time  Calculation (min) (ACUTE ONLY): 20 min Past Medical History: Past Medical History: Diagnosis Date  Down's syndrome   H/O echocardiogram 01/12/2012  Normal left ventricular wall thickness; Left ventricular systolic function is normal;  there is trace mitral and tricuspid regurgitation. EF 74%  High cholesterol   Persistent pneumonia  Past Surgical History: Past Surgical History: Procedure Laterality Date  EYE SURGERY  04/2005  HERNIA REPAIR  10/06/2004  bilateal inguinal and ventral HPI: Patient is a 56 y.o. male with PMH: Down Syndrome, HLD, hypothyroidism, OSA, seizure disorder. He presented to the ER on 03/29/22 with c/o fever, cough, SOB. CXR was concerning for RLL PNA and patient was also hypoxic. He had a reported choking episode while eating on 6/25 prompting diet downgrade to puree solids and SLP swallow evaluation order.  Subjective: pleasant, cooperative  Recommendations for follow up therapy are one component of a multi-disciplinary discharge planning process, led by the attending physician.  Recommendations may be updated based on patient status, additional functional criteria and insurance authorization. Assessment / Plan / Recommendation   03/31/2022   3:36 PM Clinical Impressions Clinical Impression Patient presents with a mild oral and a mod-severe pharyngeal phase dysphagia as per this MBS. During oral phase, he exhibited mild amount of decreased bolus control and piecemeal swallowing with portion of bolus entering high palatal arch before transiting out of oral cavity. During pharyngeal phase, patient exhibited silent aspiration of moderate amount with thin liquids via straw sip which occured before the swallow. When thin liquids and nectar thick liquids were administered via cup sips, patient exhibited silent aspiration during swallow with amount of aspirate being reduced as compared to straw sips of thin liquid. Patient tolerated spoon sips of thin and nectar thick liquids without penetration or  aspiration occuring before or during the swallow, but with trace amount of aspiration occuring after the swallow from pyriform residuals. With honey thick liquids and puree solids, patient did not exhibit any penetration or aspiration however these consistencies did result in vallecular, pyriform and posterior pharyngeal wall residuals which would slowly but not completely clear with subsequent swallows. SLP did observe interference on esophagus from cervical spine (no radiologist present to confirm) causing narrowing and leading to reduced bolus transit and likely pharyngeal residuals. As patient exhibited moderate amount of aspiration with thin liquids and aspirations with nectar thick as well (all being silent aspiration), highly suspect that his current PNA is secondary to or at least significantly impacted by aspiration of liquid PO's. SLP recommending to initiate conservative diet of  Dys 1(puree) solids and honey thick liquids with plan to work with patient and family to determine least restrictive oral diet to maximize PO intake and minimize aspiration risk in the longer term. SLP Visit Diagnosis Dysphagia, oropharyngeal phase (R13.12) Impact on safety and function Severe aspiration risk;Risk for inadequate nutrition/hydration     03/31/2022   3:36 PM Treatment Recommendations Treatment Recommendations Therapy as outlined in treatment plan below     03/31/2022   3:51 PM Prognosis Prognosis for Safe Diet Advancement Fair Barriers to Reach Goals Time post onset;Severity of deficits   03/31/2022   3:36 PM Diet Recommendations SLP Diet Recommendations Dysphagia 1 (Puree) solids;Honey thick liquids Liquid Administration via Cup;Spoon;No straw Medication Administration Whole meds with puree Compensations Minimize environmental distractions;Slow rate;Small sips/bites Postural Changes Seated upright at 90 degrees     03/31/2022   3:36 PM Other Recommendations Oral Care Recommendations Oral care BID;Oral care before and  after PO;Staff/trained caregiver to provide oral care Follow Up Recommendations Follow physician's recommendations for discharge plan and follow up therapies Assistance recommended at discharge Frequent or constant Supervision/Assistance Functional Status Assessment Patient has had a recent decline in their functional status and demonstrates the ability to make significant improvements in function in a reasonable and predictable amount of time.   03/31/2022   3:36 PM Frequency and Duration  Speech Therapy Frequency (ACUTE ONLY) min 2x/week Treatment Duration 1 week     03/31/2022   3:23 PM Oral Phase Oral Phase Impaired Oral - Honey Teaspoon Decreased bolus cohesion Oral - Honey Cup Decreased bolus cohesion;Premature spillage Oral - Nectar Teaspoon WFL Oral - Nectar Cup Premature spillage;Decreased bolus cohesion Oral - Nectar Straw Premature spillage Oral - Thin Cup Piecemeal swallowing Oral - Thin Straw Piecemeal swallowing Oral - Puree Delayed oral transit Oral - Pill Delayed oral transit    03/31/2022   3:27 PM Pharyngeal Phase Pharyngeal Phase Impaired Pharyngeal- Honey Teaspoon Pharyngeal residue - valleculae;Pharyngeal residue - pyriform Pharyngeal- Honey Cup Pharyngeal residue - valleculae;Pharyngeal residue - pyriform;Pharyngeal residue - posterior pharnyx Pharyngeal- Nectar Teaspoon Reduced airway/laryngeal closure;Trace aspiration;Penetration/Apiration after swallow;Pharyngeal residue - pyriform;Pharyngeal residue - valleculae Pharyngeal Material enters airway, passes BELOW cords without attempt by patient to eject out (silent aspiration) Pharyngeal- Nectar Cup Penetration/Apiration after swallow;Penetration/Aspiration during swallow;Pharyngeal residue - valleculae;Pharyngeal residue - pyriform Pharyngeal Material enters airway, passes BELOW cords without attempt by patient to eject out (silent aspiration) Pharyngeal- Nectar Straw Reduced airway/laryngeal closure;Penetration/Aspiration during  swallow;Pharyngeal residue - valleculae;Pharyngeal residue - pyriform Pharyngeal Material enters airway, passes BELOW cords without attempt by patient to eject out (silent aspiration) Pharyngeal- Thin Teaspoon Penetration/Apiration after swallow;Pharyngeal residue - valleculae;Pharyngeal residue - pyriform;Reduced airway/laryngeal closure Pharyngeal Material enters airway, passes BELOW cords without attempt by patient to eject out (silent aspiration) Pharyngeal- Thin Cup Reduced airway/laryngeal closure;Penetration/Aspiration during swallow;Pharyngeal residue - valleculae;Pharyngeal residue - pyriform Pharyngeal Material enters airway, passes BELOW cords without attempt by patient to eject out (silent aspiration) Pharyngeal- Thin Straw Moderate aspiration;Penetration/Aspiration before swallow;Reduced airway/laryngeal closure;Pharyngeal residue - valleculae;Pharyngeal residue - pyriform Pharyngeal Material enters airway, passes BELOW cords without attempt by patient to eject out (silent aspiration) Pharyngeal- Puree Pharyngeal residue - valleculae;Pharyngeal residue - pyriform Pharyngeal- Pill [facility]    03/31/2022   3:33 PM Cervical Esophageal Phase  Cervical Esophageal Phase Impaired Cervical Esophageal Comment cervical vertebrae appear to compress esophagus and result in more narrow passage for bolus transit (no radiologist present to confirm) Sonia Baller, MA, CCC-SLP Speech Therapy  Scheduled Meds:  acidophilus  1 capsule Oral Daily   ascorbic acid  500 mg Oral Daily   azithromycin  500 mg Oral QHS   calcium carbonate  1 tablet Oral Daily   And   magnesium oxide  200 mg Oral Daily   And   zinc sulfate  220 mg Oral Daily   cholecalciferol  1,000 Units Oral Daily   ezetimibe  10 mg Oral QHS   glycopyrrolate  0.2 mg Intravenous Q8H   guaiFENesin  600 mg Oral BID   ipratropium-albuterol  3 mL Nebulization TID   lamoTRIgine  100 mg Oral BID   levothyroxine  25 mcg Oral Q0600    mirtazapine  7.5 mg Oral QHS   omega-3 acid ethyl esters  1 g Oral Daily   rivaroxaban  20 mg Oral Q supper   scopolamine  1 patch Transdermal Q72H   Continuous Infusions:  cefTRIAXone (ROCEPHIN)  IV 2 g (04/01/22 2217)          Aline August, MD [facility] 04/02/2022, 11:10 AM

## 2022-04-02 NOTE — Progress Notes (Signed)
Daily Progress Note   Patient Name: Joseph Williams       Date: 04/02/2022 DOB: 01/30/1966  Age: 56 y.o. MRN#: [id number] Attending Physician: Aline August, MD Primary Care Physician: Willey Blade, MD Admit Date: 03/29/2022  Reason for Consultation/Follow-up: Establishing goals of care  Subjective: Awake alert, sitting up in bed, appears to have coarse rhonchorous breath sounds, mother at bedside, tracks me in the room but does not verbalize.  Does not appear to be in acute distress currently.  Length of Stay: 3  Current Medications: Scheduled Meds:   acidophilus  1 capsule Oral Daily   ascorbic acid  500 mg Oral Daily   azithromycin  500 mg Oral QHS   calcium carbonate  1 tablet Oral Daily   And   magnesium oxide  200 mg Oral Daily   And   zinc sulfate  220 mg Oral Daily   cholecalciferol  1,000 Units Oral Daily   ezetimibe  10 mg Oral QHS   glycopyrrolate  0.2 mg Intravenous Q8H   guaiFENesin  600 mg Oral BID   ipratropium-albuterol  3 mL Nebulization TID   lamoTRIgine  100 mg Oral BID   levothyroxine  25 mcg Oral Q0600   mirtazapine  7.5 mg Oral QHS   omega-3 acid ethyl esters  1 g Oral Daily   rivaroxaban  20 mg Oral Q supper   scopolamine  1 patch Transdermal Q72H    Continuous Infusions:  cefTRIAXone (ROCEPHIN)  IV 2 g (04/01/22 2217)    PRN Meds: acetaminophen, guaiFENesin-dextromethorphan, ipratropium-albuterol, lip balm, mouth rinse  Physical Exam         Awake, alert Sitting up in bed On 5 L nasal cannula, not on home oxygen as per mother. Coarse rhonchorous breath sounds Abdomen not distended S1-S2  Vital Signs: BP 119/65 (BP Location: Left Arm)   Pulse 78   Temp 98.5 F (36.9 C) (Oral)   Resp 20   Ht 5' (1.524 m)   Wt 104.9 kg   SpO2 90%    BMI 45.17 kg/m  SpO2: SpO2: 90 % O2 Device: O2 Device: Nasal Cannula O2 Flow Rate: O2 Flow Rate (L/min): 5 L/min  Intake/output summary:  Intake/Output Summary (Last 24 hours) at 04/02/2022 1003 Last data filed at 04/02/2022 0547 Gross per 24 hour  Intake --  Output 2650 ml  Net -2650 ml   LBM: Last BM Date : 04/01/22 Baseline Weight: Weight: 99.8 kg Most recent weight: Weight: 104.9 kg       Palliative Assessment/Data:      Patient Active Problem List   Diagnosis Date Noted   Sepsis due to pneumonia ([place]) 03/30/2022   Hypothyroidism 03/30/2022   COVID-19 virus infection 06/10/2021   Seizure ([place]) 06/09/2021   Cholesteatoma 06/09/2021   History of DVT (deep vein thrombosis) 06/09/2021   OSA (obstructive sleep apnea) 02/02/2020   Pneumonia, organism unspecified([place]) 12/23/2011   Down's syndrome 11/25/2011    Palliative Care Assessment & Plan   Patient Profile:    Assessment: 56 year old gentleman who lives at home with his parents, history of Down syndrome, dyslipidemia hypothyroidism OSA, seizure disorder admitted to hospital medicine service for fever cough shortness of breath pneumonia aspiration versus community-acquired.  Hospital course complicated by ongoing hypoxic respiratory failure, patient with ongoing increased secretions and increased work of breathing of an episodic nature.  Palliative medicine team consulted for goals of care discussions.  Recommendations/Plan: Agree with DO NOT RESUSCITATE Continue current mode of care Family meeting to further discuss goals of care as requested by the family schedule for 03-14-2022 at 46 AM.  Patient's brother and sister along with his mother will be present.  We will discuss about the patient's current hospitalization course, expected progressive decline due to the nature of his irreversible illness, discussed about options for hospice versus palliative support at home.  Further recommendations to follow.  Goals of  Care and Additional Recommendations: Limitations on Scope of Treatment: Full Scope Treatment  Code Status:    Code Status Orders  (From admission, onward)           Start     Ordered   04/01/22 1409  Do not attempt resuscitation (DNR)  Continuous       Question Answer Comment  In the event of cardiac or respiratory ARREST Do not call a "code blue"   In the event of cardiac or respiratory ARREST Do not perform Intubation, CPR, defibrillation or ACLS   In the event of cardiac or respiratory ARREST Use medication by any route, position, wound care, and other measures to relive pain and suffering. May use oxygen, suction and manual treatment of airway obstruction as needed for comfort.      04/01/22 1408           Code Status History     Date Active Date Inactive Code Status Order ID Comments User Context   03/30/2022 0231 04/01/2022 1408 Full Code [id number]  Elwyn Reach, MD Inpatient   06/09/2021 1213 06/11/2021 1940 Full Code [id number]  Norval Morton, MD ED       Prognosis:  Unable to determine  Discharge Planning: To Be Determined  Care plan was discussed with patient's mother RN Ryen Rhames at bedside.   Thank you for allowing the Palliative Medicine Team to assist in the care of this patient.   Time In:  9 Time Out: 9.35 Total Time 35 Prolonged Time Billed  no       Greater than 50%  of this time was spent counseling and coordinating care related to the above assessment and plan.  Loistine Chance, MD  Please contact Palliative Medicine Team phone at [phone] for questions and concerns.

## 2022-04-02 NOTE — Care Management Important Message (Signed)
Important Message  Patient Details IM Letter placed in Patients room. Name: Joseph Williams MRN: [id number] Date of Birth: 04-07-1966   Medicare Important Message Given:  Yes     Kerin Salen 04/02/2022, 11:03 AM

## 2022-04-03 DIAGNOSIS — J9601 Acute respiratory failure with hypoxia: Secondary | ICD-10-CM | POA: Diagnosis not present

## 2022-04-03 DIAGNOSIS — Z515 Encounter for palliative care: Secondary | ICD-10-CM | POA: Diagnosis not present

## 2022-04-03 DIAGNOSIS — Z7189 Other specified counseling: Secondary | ICD-10-CM | POA: Diagnosis not present

## 2022-04-03 DIAGNOSIS — A419 Sepsis, unspecified organism: Secondary | ICD-10-CM | POA: Diagnosis not present

## 2022-04-03 DIAGNOSIS — Q909 Down syndrome, unspecified: Secondary | ICD-10-CM | POA: Diagnosis not present

## 2022-04-03 DIAGNOSIS — J189 Pneumonia, unspecified organism: Secondary | ICD-10-CM | POA: Diagnosis not present

## 2022-04-03 DIAGNOSIS — R531 Weakness: Secondary | ICD-10-CM | POA: Diagnosis not present

## 2022-04-03 LAB — CBC WITH DIFFERENTIAL/PLATELET
Abs Immature Granulocytes: 0.01 10*3/uL (ref 0.00–0.07)
Basophils Absolute: 0.1 10*3/uL (ref 0.0–0.1)
Basophils Relative: 1 %
Eosinophils Absolute: 0.3 10*3/uL (ref 0.0–0.5)
Eosinophils Relative: 7 %
HCT: 40.3 % (ref 39.0–52.0)
Hemoglobin: 13.5 g/dL (ref 13.0–17.0)
Immature Granulocytes: 0 %
Lymphocytes Relative: 20 %
Lymphs Abs: 1 10*3/uL (ref 0.7–4.0)
MCH: 34.4 pg — ABNORMAL HIGH (ref 26.0–34.0)
MCHC: 33.5 g/dL (ref 30.0–36.0)
MCV: 102.5 fL — ABNORMAL HIGH (ref 80.0–100.0)
Monocytes Absolute: 0.6 10*3/uL (ref 0.1–1.0)
Monocytes Relative: 12 %
Neutro Abs: 3 10*3/uL (ref 1.7–7.7)
Neutrophils Relative %: 60 %
Platelets: 239 10*3/uL (ref 150–400)
RBC: 3.93 MIL/uL — ABNORMAL LOW (ref 4.22–5.81)
RDW: 13.3 % (ref 11.5–15.5)
WBC: 4.9 10*3/uL (ref 4.0–10.5)
nRBC: 0 % (ref 0.0–0.2)

## 2022-04-03 LAB — COMPREHENSIVE METABOLIC PANEL
ALT: 25 U/L (ref 0–44)
AST: 23 U/L (ref 15–41)
Albumin: 2.9 g/dL — ABNORMAL LOW (ref 3.5–5.0)
Alkaline Phosphatase: 74 U/L (ref 38–126)
Anion gap: 8 (ref 5–15)
BUN: 16 mg/dL (ref 6–20)
CO2: 32 mmol/L (ref 22–32)
Calcium: 8.8 mg/dL — ABNORMAL LOW (ref 8.9–10.3)
Chloride: 104 mmol/L (ref 98–111)
Creatinine, Ser: 1.07 mg/dL (ref 0.61–1.24)
GFR, Estimated: >60 mL/min (ref >60)
Glucose, Bld: 97 mg/dL (ref 70–99)
Potassium: 3.8 mmol/L (ref 3.5–5.1)
Sodium: 144 mmol/L (ref 135–145)
Total Bilirubin: 0.7 mg/dL (ref 0.3–1.2)
Total Protein: 7.2 g/dL (ref 6.5–8.1)

## 2022-04-03 LAB — MAGNESIUM: Magnesium: 2.5 mg/dL — ABNORMAL HIGH (ref 1.7–2.4)

## 2022-04-03 MED ORDER — FUROSEMIDE 10 MG/ML IJ SOLN
40.0000 mg | Freq: Once | INTRAMUSCULAR | Status: AC
  Administered 2022-04-03: 40 mg via INTRAVENOUS
  Filled 2022-04-03: qty 4

## 2022-04-03 MED ORDER — MORPHINE 100MG IN NS 100ML (1MG/ML) PREMIX INFUSION
2.0000 mg/h | INTRAVENOUS | Status: DC
  Administered 2022-04-03: 2 mg/h via INTRAVENOUS
  Filled 2022-04-03: qty 100

## 2022-04-03 MED ORDER — LAMOTRIGINE 100 MG PO TABS
100.0000 mg | ORAL_TABLET | Freq: Two times a day (BID) | ORAL | Status: AC
Start: 2022-04-03 — End: ?

## 2022-04-03 MED ORDER — MORPHINE BOLUS VIA INFUSION: 1.0000 mg | INTRAVENOUS | Status: DC | PRN

## 2022-04-03 NOTE — Progress Notes (Signed)
Pt was administered a morphine drip that was stopped before completion due to discharge to [facility]. This nurse and Sonne, RN and charge for the day wasted together 65 mL of morphine and dispensed into waste container.

## 2022-04-03 NOTE — TOC Transition Note (Addendum)
Transition of Care [facility]) - CM/SW Discharge Note   Patient Details  Name: Joseph Williams MRN: [id number] Date of Birth: [birth year]  Transition of Care [facility]) CM/SW Contact:  Vassie Moselle, LCSW Phone Number: 04/03/2022, 12:11 PM   Clinical Narrative:    Palliative Care met with pt and family and have recommended residential hospice care for this pt. Met with family and confirmed plans for pt to transfer to [facility] for inpatient hospice. CSW contacted [facility] to assess pt for placement at their facility.   1430: Pt is to transfer to [facility] for residential hospice. [facility] will be called for transportation.    Final next level of care: [place] Barriers to Discharge: Continued Medical Work up   Patient Goals and CMS Choice Patient states their goals for this hospitalization and ongoing recovery are:: To return home   Choice offered to / list presented to : [facility] POA / Guardian, Parent  Discharge Placement                       Discharge Plan and Services In-house Referral: Clinical Social Work, [facility] Discharge Planning Services: CM Consult Post Acute Care Choice: Home Health, Durable Medical Equipment          DME Arranged: N/A         HH Arranged: PT HH Agency: [place] Date [place]: 04/01/22 Time [place]: 0902 Representative spoke with at [place]: [place] ([place]) Interventions     Readmission Risk Interventions    04/03/2022   12:11 PM 04/01/2022   11:39 AM  Readmission Risk Prevention Plan  Post Dischage Appt Complete Complete  Medication Screening Complete Complete  Transportation Screening Complete Complete

## 2022-04-03 NOTE — Progress Notes (Addendum)
Manufacturing engineer [facility]) Hospital Liaison Note  Referral received for patient/family interest in [facility]. Chart under review by [facility] physician.   Hospice eligibility confirmed.   Bed offered and accepted for transfer today. Unit RN please call report to [phone] prior to patient leaving the unit. Please send signed DNR and paperwork with patient.   Please call with any questions or concerns. Thank you  Roselee Nova, [place] Hospital Liaison [phone]

## 2022-04-03 NOTE — Discharge Summary (Signed)
Physician Discharge Summary  Joseph Williams TFT:[id number] DOB: March 10, 1966 DOA: 03/29/2022  PCP: Willey Blade, MD  Admit date: 03/29/2022 Discharge date: 04/03/2022  Admitted From: Home Disposition: Residential hospice  Recommendations for Outpatient Follow-up:  Follow up with provider at residential hospice    Home Health: No Equipment/Devices: None  Discharge Condition: Poor CODE STATUS: DNR Diet recommendation: As per comfort measures  Brief/Interim Summary: 56 y.o. male with medical history significant of Down syndrome, hyperlipidemia, hypothyroidism, obstructive sleep apnea, seizure disorder presented with fever, cough and shortness of breath. He was found to have right lobe pneumonia along with hypoxia for which he was started on iv antibiotics.  During the hospitalization, his respiratory status has worsened.  Palliative care has been consulted. Family decided to pursue comfort measures and residential hospice placement. He will be discharged to residential hospice once bed is available.  Discharge Diagnoses:   Acute respiratory failure with hypoxia Possible aspiration pneumonia Sepsis: Present on admission Dysphagia Down syndrome History of seizure disorder History of DVT OSA Hypothyroidism  Plan:  Family decided to pursue comfort measures and residential hospice placement. He will be discharged to residential hospice once bed is available.   Discharge Instructions   Allergies as of 04/03/2022       Reactions   Keppra [levetiracetam] Other (See Comments)   "Make him like a zombie"   Lacosamide Other (See Comments)   "Makes him very combative"        Medication List     STOP taking these medications    B COMPLEX 1 PO   CALCIUM MAGNESIUM ZINC PO   Calcium-Magnesium-Zinc Tabs   cholecalciferol 1000 units tablet Commonly known as: VITAMIN D   Coenzyme Q10 300 MG Caps   ezetimibe 10 MG tablet Commonly known as: ZETIA   fish oil-omega-3  fatty acids 1000 MG capsule   levothyroxine 25 MCG tablet Commonly known as: SYNTHROID   PROBIOTIC-10 PO   rivaroxaban 20 MG Tabs tablet Commonly known as: XARELTO   Vitamin C 500 MG Caps       TAKE these medications    lamoTRIgine 100 MG tablet Commonly known as: LAMICTAL Take 1 tablet (100 mg total) by mouth 2 (two) times daily. What changed:  medication strength how much to take how to take this when to take this additional instructions   mirtazapine 7.5 MG tablet Commonly known as: REMERON Take 7.5 mg by mouth at bedtime.        Allergies  Allergen Reactions   Keppra [Levetiracetam] Other (See Comments)    "Make him like a zombie"   Lacosamide Other (See Comments)    "Makes him very combative"    Consultations: Palliative care   Procedures/Studies: DG CHEST PORT 1 VIEW  Result Date: 04/02/2022 CLINICAL DATA:  Dyspnea EXAM: PORTABLE CHEST 1 VIEW COMPARISON:  Chest radiograph 1 day prior FINDINGS: The heart is enlarged, unchanged. The upper mediastinal contours are stable. There are diffusely increased interstitial markings throughout both lungs likely reflecting pulmonary interstitial edema with small right and probable trace left bilateral effusions and adjacent bibasilar opacities. Overall, aeration is worsened compared to the study from 03/30/2022. There is no pneumothorax. There is no acute osseous abnormality. IMPRESSION: Mild pulmonary interstitial edema and small right and probable trace left pleural effusions with adjacent bibasilar airspace opacities, overall worsened since 03/30/2022. Electronically Signed   By: Valetta Mole M.D.   On: 04/02/2022 10:13   DG Swallowing Func-Speech Pathology  Result Date: 03/31/2022 Table formatting from the original  result was not included. Objective Swallowing Evaluation: Type of Study: MBS-Modified Barium Swallow Study  Patient Details Name: Joseph Williams MRN: [id number] Date of Birth: 1966-06-26 Today's Date:  03/31/2022 Time: SLP Start Time (ACUTE ONLY): 1340 -SLP Stop Time (ACUTE ONLY): 1400 SLP Time Calculation (min) (ACUTE ONLY): 20 min Past Medical History: Past Medical History: Diagnosis Date  Down's syndrome   H/O echocardiogram 01/12/2012  Normal left ventricular wall thickness; Left ventricular systolic function is normal;  there is trace mitral and tricuspid regurgitation. EF 74%  High cholesterol   Persistent pneumonia  Past Surgical History: Past Surgical History: Procedure Laterality Date  EYE SURGERY  04/2005  HERNIA REPAIR  10/06/2004  bilateal inguinal and ventral HPI: Patient is a 56 y.o. male with PMH: Down Syndrome, HLD, hypothyroidism, OSA, seizure disorder. He presented to the ER on 03/29/22 with c/o fever, cough, SOB. CXR was concerning for RLL PNA and patient was also hypoxic. He had a reported choking episode while eating on 6/25 prompting diet downgrade to puree solids and SLP swallow evaluation order.  Subjective: pleasant, cooperative  Recommendations for follow up therapy are one component of a multi-disciplinary discharge planning process, led by the attending physician.  Recommendations may be updated based on patient status, additional functional criteria and insurance authorization. Assessment / Plan / Recommendation   03/31/2022   3:36 PM Clinical Impressions Clinical Impression Patient presents with a mild oral and a mod-severe pharyngeal phase dysphagia as per this MBS. During oral phase, he exhibited mild amount of decreased bolus control and piecemeal swallowing with portion of bolus entering high palatal arch before transiting out of oral cavity. During pharyngeal phase, patient exhibited silent aspiration of moderate amount with thin liquids via straw sip which occured before the swallow. When thin liquids and nectar thick liquids were administered via cup sips, patient exhibited silent aspiration during swallow with amount of aspirate being reduced as compared to straw sips of thin  liquid. Patient tolerated spoon sips of thin and nectar thick liquids without penetration or aspiration occuring before or during the swallow, but with trace amount of aspiration occuring after the swallow from pyriform residuals. With honey thick liquids and puree solids, patient did not exhibit any penetration or aspiration however these consistencies did result in vallecular, pyriform and posterior pharyngeal wall residuals which would slowly but not completely clear with subsequent swallows. SLP did observe interference on esophagus from cervical spine (no radiologist present to confirm) causing narrowing and leading to reduced bolus transit and likely pharyngeal residuals. As patient exhibited moderate amount of aspiration with thin liquids and aspirations with nectar thick as well (all being silent aspiration), highly suspect that his current PNA is secondary to or at least significantly impacted by aspiration of liquid PO's. SLP recommending to initiate conservative diet of Dys 1(puree) solids and honey thick liquids with plan to work with patient and family to determine least restrictive oral diet to maximize PO intake and minimize aspiration risk in the longer term. SLP Visit Diagnosis Dysphagia, oropharyngeal phase (R13.12) Impact on safety and function Severe aspiration risk;Risk for inadequate nutrition/hydration     03/31/2022   3:36 PM Treatment Recommendations Treatment Recommendations Therapy as outlined in treatment plan below     03/31/2022   3:51 PM Prognosis Prognosis for Safe Diet Advancement Fair Barriers to Reach Goals Time post onset;Severity of deficits   03/31/2022   3:36 PM Diet Recommendations SLP Diet Recommendations Dysphagia 1 (Puree) solids;Honey thick liquids Liquid Administration via Cup;Spoon;No straw Medication Administration  Whole meds with puree Compensations Minimize environmental distractions;Slow rate;Small sips/bites Postural Changes Seated upright at 90 degrees     03/31/2022    3:36 PM Other Recommendations Oral Care Recommendations Oral care BID;Oral care before and after PO;Staff/trained caregiver to provide oral care Follow Up Recommendations Follow physician's recommendations for discharge plan and follow up therapies Assistance recommended at discharge Frequent or constant Supervision/Assistance Functional Status Assessment Patient has had a recent decline in their functional status and demonstrates the ability to make significant improvements in function in a reasonable and predictable amount of time.   03/31/2022   3:36 PM Frequency and Duration  Speech Therapy Frequency (ACUTE ONLY) min 2x/week Treatment Duration 1 week     03/31/2022   3:23 PM Oral Phase Oral Phase Impaired Oral - Honey Teaspoon Decreased bolus cohesion Oral - Honey Cup Decreased bolus cohesion;Premature spillage Oral - Nectar Teaspoon WFL Oral - Nectar Cup Premature spillage;Decreased bolus cohesion Oral - Nectar Straw Premature spillage Oral - Thin Cup Piecemeal swallowing Oral - Thin Straw Piecemeal swallowing Oral - Puree Delayed oral transit Oral - Pill Delayed oral transit    03/31/2022   3:27 PM Pharyngeal Phase Pharyngeal Phase Impaired Pharyngeal- Honey Teaspoon Pharyngeal residue - valleculae;Pharyngeal residue - pyriform Pharyngeal- Honey Cup Pharyngeal residue - valleculae;Pharyngeal residue - pyriform;Pharyngeal residue - posterior pharnyx Pharyngeal- Nectar Teaspoon Reduced airway/laryngeal closure;Trace aspiration;Penetration/Apiration after swallow;Pharyngeal residue - pyriform;Pharyngeal residue - valleculae Pharyngeal Material enters airway, passes BELOW cords without attempt by patient to eject out (silent aspiration) Pharyngeal- Nectar Cup Penetration/Apiration after swallow;Penetration/Aspiration during swallow;Pharyngeal residue - valleculae;Pharyngeal residue - pyriform Pharyngeal Material enters airway, passes BELOW cords without attempt by patient to eject out (silent aspiration) Pharyngeal-  Nectar Straw Reduced airway/laryngeal closure;Penetration/Aspiration during swallow;Pharyngeal residue - valleculae;Pharyngeal residue - pyriform Pharyngeal Material enters airway, passes BELOW cords without attempt by patient to eject out (silent aspiration) Pharyngeal- Thin Teaspoon Penetration/Apiration after swallow;Pharyngeal residue - valleculae;Pharyngeal residue - pyriform;Reduced airway/laryngeal closure Pharyngeal Material enters airway, passes BELOW cords without attempt by patient to eject out (silent aspiration) Pharyngeal- Thin Cup Reduced airway/laryngeal closure;Penetration/Aspiration during swallow;Pharyngeal residue - valleculae;Pharyngeal residue - pyriform Pharyngeal Material enters airway, passes BELOW cords without attempt by patient to eject out (silent aspiration) Pharyngeal- Thin Straw Moderate aspiration;Penetration/Aspiration before swallow;Reduced airway/laryngeal closure;Pharyngeal residue - valleculae;Pharyngeal residue - pyriform Pharyngeal Material enters airway, passes BELOW cords without attempt by patient to eject out (silent aspiration) Pharyngeal- Puree Pharyngeal residue - valleculae;Pharyngeal residue - pyriform Pharyngeal- Pill [facility]    03/31/2022   3:33 PM Cervical Esophageal Phase  Cervical Esophageal Phase Impaired Cervical Esophageal Comment cervical vertebrae appear to compress esophagus and result in more narrow passage for bolus transit (no radiologist present to confirm) Sonia Baller, MA, CCC-SLP Speech Therapy                     DG CHEST PORT 1 VIEW  Result Date: 03/30/2022 CLINICAL DATA:  Shortness of breath, choking episode EXAM: PORTABLE CHEST 1 VIEW COMPARISON:  03/29/2022 FINDINGS: Heart is borderline in size. Mild vascular congestion. Right basilar opacity, slightly improved since prior study. No confluent opacity on the left. IMPRESSION: Continued mild vascular congestion. Right lower lobe atelectasis or infiltrate, slightly improved since prior study.  Electronically Signed   By: Rolm Baptise M.D.   On: 03/30/2022 20:21   DG Chest Port 1 View  Result Date: 03/29/2022 CLINICAL DATA:  Questionable sepsis - evaluate for abnormality. Shortness of breath EXAM: PORTABLE CHEST 1 VIEW COMPARISON:  None Available.  FINDINGS: Vascular congestion and interstitial prominence again noted, similar to prior study. This could reflect interstitial edema or chronic interstitial lung disease. Confluent airspace opacity in the right lower lobe concerning for pneumonia. Heart is normal size. No visible effusions. No acute bony abnormality. IMPRESSION: Right lower lobe consolidation concerning for pneumonia. Vascular congestion and interstitial prominence. This may reflect interstitial edema or chronic interstitial lung disease. Electronically Signed   By: Rolm Baptise M.D.   On: 03/29/2022 23:13      Subjective: Patient seen and examined at bedside.  Poor historian.  Mother and sister at bedside.  No temperature spikes over the last 24 hours.  Mother is concerned that patient is more sleepy today and more short of breath.    Discharge Exam: Vitals:   04/03/22 0854 04/03/22 1426  BP:    Pulse:    Resp:    Temp:    SpO2: 93% 95%    General: Still on 5 L oxygen via nasal cannula.  No distress.  Looks chronically ill and deconditioned.   ENT/neck: No thyromegaly.  JVD is not elevated  respiratory: Decreased breath sounds at bases bilaterally with some crackles; no wheezing.  Intermittently tachypneic CVS: S1-S2 heard, rate controlled currently Abdominal: Soft, nontender, slightly distended; no organomegaly,  bowel sounds are heard Extremities: Trace lower extremity edema; no cyanosis  CNS: Drowsy, wakes up only very slightly, hardly response.  Poor historian.  No focal neurologic deficit.  Moves extremities Lymph: No obvious lymphadenopathy Skin: No obvious ecchymosis/lesions  psych: Could not be assessed because of mental status. Musculoskeletal: No obvious  joint swelling/deformity      The results of significant diagnostics from this hospitalization (including imaging, microbiology, ancillary and laboratory) are listed below for reference.     Microbiology: Recent Results (from the past 240 hour(s))  Resp Panel by RT-PCR (Flu A&B, Covid) Anterior Nasal Swab     Status: None   Collection Time: 03/29/22 12:05 AM   Specimen: Anterior Nasal Swab  Result Value Ref Range Status   SARS Coronavirus 2 by RT PCR NEGATIVE NEGATIVE Final    Comment: (NOTE) SARS-CoV-2 target nucleic acids are NOT DETECTED.  The SARS-CoV-2 RNA is generally detectable in upper respiratory specimens during the acute phase of infection. The lowest concentration of SARS-CoV-2 viral copies this assay can detect is 138 copies/mL. A negative result does not preclude SARS-Cov-2 infection and should not be used as the sole basis for treatment or other patient management decisions. A negative result may occur with  improper specimen collection/handling, submission of specimen other than nasopharyngeal swab, presence of viral mutation(s) within the areas targeted by this assay, and inadequate number of viral copies(<138 copies/mL). A negative result must be combined with clinical observations, patient history, and epidemiological information. The expected result is Negative.  Fact Sheet for Patients:  EntrepreneurPulse.com.au  Fact Sheet for Healthcare Providers:  IncredibleEmployment.be  This test is no t yet approved or cleared by the [place] FDA and  has been authorized for detection and/or diagnosis of SARS-CoV-2 by FDA under an Emergency Use Authorization (EUA). This EUA will remain  in effect (meaning this test can be used) for the duration of the COVID-19 declaration under Section 564(b)(1) of the Act, 21 U.S.C.section 360bbb-3(b)(1), unless the authorization is terminated  or revoked sooner.       Influenza A by  PCR NEGATIVE NEGATIVE Final   Influenza B by PCR NEGATIVE NEGATIVE Final    Comment: (NOTE) The Xpert Xpress SARS-CoV-2/FLU/RSV plus assay  is intended as an aid in the diagnosis of influenza from Nasopharyngeal swab specimens and should not be used as a sole basis for treatment. Nasal washings and aspirates are unacceptable for Xpert Xpress SARS-CoV-2/FLU/RSV testing.  Fact Sheet for Patients: EntrepreneurPulse.com.au  Fact Sheet for Healthcare Providers: IncredibleEmployment.be  This test is not yet approved or cleared by the [place] FDA and has been authorized for detection and/or diagnosis of SARS-CoV-2 by FDA under an Emergency Use Authorization (EUA). This EUA will remain in effect (meaning this test can be used) for the duration of the COVID-19 declaration under Section 564(b)(1) of the Act, 21 U.S.C. section 360bbb-3(b)(1), unless the authorization is terminated or revoked.  Performed at [facility], [place] [address], [place], [place] [address]   Blood Culture (routine x 2)     Status: None (Preliminary result)   Collection Time: 03/29/22 10:45 PM   Specimen: BLOOD  Result Value Ref Range Status   Specimen Description   Final    BLOOD BLOOD LEFT ARM Performed at [facility] [address] [place], [place] [address]    Special Requests   Final    BOTTLES DRAWN AEROBIC AND ANAEROBIC Blood Culture adequate volume Performed at [place] [address] [place], [place] [address]    Culture   Final    NO GROWTH 4 DAYS Performed at [facility], [place] [address], [place], [place] [address]    Report Status PENDING  Incomplete  Blood Culture (routine x 2)     Status: None (Preliminary result)   Collection Time: 03/29/22 11:40 PM   Specimen: BLOOD  Result Value Ref Range Status   Specimen Description   Final    BLOOD BLOOD RIGHT HAND Performed at [place] [address] [place], [place] [address]    Special Requests   Final    BOTTLES DRAWN AEROBIC AND ANAEROBIC Blood Culture adequate volume Performed at [facility] [address] [place], [place] [address]    Culture   Final    NO GROWTH 4 DAYS Performed at [facility], [place] [address], [place], [place] [address]    Report Status PENDING  Incomplete  Urine Culture     Status: Abnormal   Collection Time: 03/30/22  3:34 AM   Specimen: In/Out Cath Urine  Result Value Ref Range Status   Specimen Description   Final    IN/OUT CATH URINE Performed at [place] [address], [place], [place] [address]    Special Requests   Final    NONE Performed at [facility], [place] [address] [place], [place] [address]    Culture 20,000 COLONIES/mL STAPHYLOCOCCUS HAEMOLYTICUS (A)  Final   Report Status 04/01/2022 FINAL  Final   Organism ID, Bacteria STAPHYLOCOCCUS HAEMOLYTICUS (A)  Final      Susceptibility   Staphylococcus haemolyticus - MIC*    CIPROFLOXACIN >=8 RESISTANT Resistant     GENTAMICIN <=0.5 SENSITIVE Sensitive     NITROFURANTOIN <=16 SENSITIVE Sensitive     OXACILLIN >=4 RESISTANT Resistant     TETRACYCLINE >=16 RESISTANT Resistant     VANCOMYCIN 1 SENSITIVE Sensitive     TRIMETH/SULFA 80 RESISTANT Resistant     CLINDAMYCIN >=8 RESISTANT Resistant     RIFAMPIN <=0.5 SENSITIVE Sensitive     Inducible Clindamycin NEGATIVE Sensitive     * 20,000 COLONIES/mL STAPHYLOCOCCUS HAEMOLYTICUS     Labs: BNP (  last 3 results) Recent Labs    06/10/21 1050 03/30/22 2112  BNP 38.6 89.3   Basic Metabolic Panel: Recent Labs  Lab 03/29/22 2259 03/30/22 0539 03/31/22 0524 04/02/22 0545 04/03/22 0517  NA 142 143 144 144 144  K 3.8 4.1 3.6 3.8 3.8  CL 108 111 110 105 104  CO2 '29 25 28 31 '$ 32  GLUCOSE 116* 162* 100* 102* 97  BUN '12 11 12 14 16  '$ CREATININE 1.17 0.98 1.04 1.16 1.07  CALCIUM  8.8* 8.2* 7.8* 8.7* 8.8*  MG  --   --   --   --  2.5*   Liver Function Tests: Recent Labs  Lab 03/29/22 2259 03/30/22 0539 04/03/22 0517  AST 29 32 23  ALT '22 25 25  '$ ALKPHOS 84 72 74  BILITOT 0.7 0.5 0.7  PROT 7.4 6.7 7.2  ALBUMIN 3.3* 2.9* 2.9*   No results for input(s): "LIPASE", "AMYLASE" in the last 168 hours. No results for input(s): "AMMONIA" in the last 168 hours. CBC: Recent Labs  Lab 03/29/22 2259 03/30/22 0539 03/31/22 0524 04/02/22 0545 04/03/22 0517  WBC 11.7* 12.9* 8.3 6.1 4.9  NEUTROABS 10.1*  --   --   --  3.0  HGB 15.1 13.7 13.1 13.9 13.5  HCT 44.5 40.7 40.5 41.5 40.3  MCV 100.9* 101.5* 103.1* 103.5* 102.5*  PLT 219 218 207 240 239   Cardiac Enzymes: No results for input(s): "CKTOTAL", "CKMB", "CKMBINDEX", "TROPONINI" in the last 168 hours. BNP: Invalid input(s): "POCBNP" CBG: No results for input(s): "GLUCAP" in the last 168 hours. D-Dimer No results for input(s): "DDIMER" in the last 72 hours. Hgb A1c No results for input(s): "HGBA1C" in the last 72 hours. Lipid Profile No results for input(s): "CHOL", "HDL", "LDLCALC", "TRIG", "CHOLHDL", "LDLDIRECT" in the last 72 hours. Thyroid function studies No results for input(s): "TSH", "T4TOTAL", "T3FREE", "THYROIDAB" in the last 72 hours.  Invalid input(s): "FREET3" Anemia work up No results for input(s): "VITAMINB12", "FOLATE", "FERRITIN", "TIBC", "IRON", "RETICCTPCT" in the last 72 hours. Urinalysis    Component Value Date/Time   COLORURINE YELLOW 03/30/2022 0334   APPEARANCEUR HAZY (A) 03/30/2022 0334   LABSPEC 1.009 03/30/2022 0334   PHURINE 7.0 03/30/2022 0334   GLUCOSEU NEGATIVE 03/30/2022 0334   HGBUR NEGATIVE 03/30/2022 0334   BILIRUBINUR NEGATIVE 03/30/2022 0334   KETONESUR NEGATIVE 03/30/2022 0334   PROTEINUR NEGATIVE 03/30/2022 0334   NITRITE NEGATIVE 03/30/2022 0334   LEUKOCYTESUR NEGATIVE 03/30/2022 0334   Sepsis Labs Recent Labs  Lab 03/30/22 0539 03/31/22 0524  04/02/22 0545 04/03/22 0517  WBC 12.9* 8.3 6.1 4.9   Microbiology Recent Results (from the past 240 hour(s))  Resp Panel by RT-PCR (Flu A&B, Covid) Anterior Nasal Swab     Status: None   Collection Time: 03/29/22 12:05 AM   Specimen: Anterior Nasal Swab  Result Value Ref Range Status   SARS Coronavirus 2 by RT PCR NEGATIVE NEGATIVE Final    Comment: (NOTE) SARS-CoV-2 target nucleic acids are NOT DETECTED.  The SARS-CoV-2 RNA is generally detectable in upper respiratory specimens during the acute phase of infection. The lowest concentration of SARS-CoV-2 viral copies this assay can detect is 138 copies/mL. A negative result does not preclude SARS-Cov-2 infection and should not be used as the sole basis for treatment or other patient management decisions. A negative result may occur with  improper specimen collection/handling, submission of specimen other than nasopharyngeal swab, presence of viral mutation(s) within the areas targeted by this assay, and inadequate number  of viral copies(<138 copies/mL). A negative result must be combined with clinical observations, patient history, and epidemiological information. The expected result is Negative.  Fact Sheet for Patients:  EntrepreneurPulse.com.au  Fact Sheet for Healthcare Providers:  IncredibleEmployment.be  This test is no t yet approved or cleared by the [place] FDA and  has been authorized for detection and/or diagnosis of SARS-CoV-2 by FDA under an Emergency Use Authorization (EUA). This EUA will remain  in effect (meaning this test can be used) for the duration of the COVID-19 declaration under Section 564(b)(1) of the Act, 21 U.S.C.section 360bbb-3(b)(1), unless the authorization is terminated  or revoked sooner.       Influenza A by PCR NEGATIVE NEGATIVE Final   Influenza B by PCR NEGATIVE NEGATIVE Final    Comment: (NOTE) The Xpert Xpress SARS-CoV-2/FLU/RSV plus assay is  intended as an aid in the diagnosis of influenza from Nasopharyngeal swab specimens and should not be used as a sole basis for treatment. Nasal washings and aspirates are unacceptable for Xpert Xpress SARS-CoV-2/FLU/RSV testing.  Fact Sheet for Patients: EntrepreneurPulse.com.au  Fact Sheet for Healthcare Providers: IncredibleEmployment.be  This test is not yet approved or cleared by the [place] FDA and has been authorized for detection and/or diagnosis of SARS-CoV-2 by FDA under an Emergency Use Authorization (EUA). This EUA will remain in effect (meaning this test can be used) for the duration of the COVID-19 declaration under Section 564(b)(1) of the Act, 21 U.S.C. section 360bbb-3(b)(1), unless the authorization is terminated or revoked.  Performed at [facility], [place] [address], [place], [place] [address]   Blood Culture (routine x 2)     Status: None (Preliminary result)   Collection Time: 03/29/22 10:45 PM   Specimen: BLOOD  Result Value Ref Range Status   Specimen Description   Final    BLOOD BLOOD LEFT ARM Performed at [facility] [address], [place], [place] [id number]    Special Requests   Final    BOTTLES DRAWN AEROBIC AND ANAEROBIC Blood Culture adequate volume Performed at [facility] [address], [place], [place] [address]    Culture   Final    NO GROWTH 4 DAYS Performed at [facility], [place] [address] [place], [place] [address]    Report Status PENDING  Incomplete  Blood Culture (routine x 2)     Status: None (Preliminary result)   Collection Time: 03/29/22 11:40 PM   Specimen: BLOOD  Result Value Ref Range Status   Specimen Description   Final    BLOOD BLOOD RIGHT HAND Performed at [facility] [address], [place], [place] [address]    Special Requests   Final    BOTTLES DRAWN AEROBIC AND ANAEROBIC Blood Culture  adequate volume Performed at [facility] [address], [place], [place] [address]    Culture   Final    NO GROWTH 4 DAYS Performed at [facility], [place] [address], [place], [place] [address]    Report Status PENDING  Incomplete  Urine Culture     Status: Abnormal   Collection Time: 03/30/22  3:34 AM   Specimen: In/Out Cath Urine  Result Value Ref Range Status   Specimen Description   Final    IN/OUT CATH URINE Performed at [facility] [address] [place], [place] [address]    Special Requests   Final    NONE Performed at [facility], [place] [address] [place], [place] [address]    Culture 20,000 COLONIES/mL STAPHYLOCOCCUS HAEMOLYTICUS (A)  Final   Report Status 04/01/2022 FINAL  Final   Organism ID, Bacteria STAPHYLOCOCCUS HAEMOLYTICUS (A)  Final      Susceptibility   Staphylococcus haemolyticus - MIC*    CIPROFLOXACIN >=8 RESISTANT Resistant     GENTAMICIN <=0.5 SENSITIVE Sensitive     NITROFURANTOIN <=16 SENSITIVE Sensitive     OXACILLIN >=4 RESISTANT Resistant     TETRACYCLINE >=16 RESISTANT Resistant     VANCOMYCIN 1 SENSITIVE Sensitive     TRIMETH/SULFA 80 RESISTANT Resistant     CLINDAMYCIN >=8 RESISTANT Resistant     RIFAMPIN <=0.5 SENSITIVE Sensitive     Inducible Clindamycin NEGATIVE Sensitive     * 20,000 COLONIES/mL STAPHYLOCOCCUS HAEMOLYTICUS     Time coordinating discharge: 35 minutes  SIGNED:   Aline August, MD  [facility] 04/03/2022, 2:35 PM

## 2022-04-03 NOTE — Progress Notes (Signed)
PROGRESS NOTE    ROBERTA KELLY  BOF:[id number] DOB: [birth year] DOA: 03/29/2022 PCP: Willey Blade, MD   Brief Narrative:  56 y.o. male with medical history significant of Down syndrome, hyperlipidemia, hypothyroidism, obstructive sleep apnea, seizure disorder presented with fever, cough and shortness of breath. He was found to have right lobe pneumonia along with hypoxia for which he was started on iv antibiotics.  During the hospitalization, his respiratory status has worsened.  Palliative care has been consulted.  Assessment & Plan:   Acute respiratory failure with hypoxia Possible aspiration pneumonia Sepsis: Present on admission -Respiratory status worsened on 04/01/2022 requiring more amount of oxygen.  Patient was started on glycopyrrolate because of increased secretions.  Currently still on 5 L oxygen by nasal cannula.  Wean off as able. -Chest x-ray on 04/02/2022 showed mild pulmonary interstitial edema and small right and probable trace left pleural effusions with adjacent bibasilar space opacities, overall worsened since 03/30/2022.  He received another dose of IV Lasix on 03/25/2022. -Continue Rocephin and Zithromax. -Influenza and COVID testing negative on admission.  Blood cultures have remained negative so far.  Urine culture grew 20,000 colonies /mL staph hemolyticus -Patient is status looks worse today and patient is more drowsy.  We will wait for palliative care discussion with family today: DC scopolamine patch as mother is concerned that this is making him more drowsy.  Dysphagia -SLP following.  Diet as per SLP recommendations.  Patient is at high risk for recurrent aspiration pneumonias.  Down syndrome History of seizure disorder -Continue Lamictal.  Outpatient follow-up with neurology.  Fall precautions.  Seizure precautions.  History of DVT -Diagnosed in 2022.  Continue Xarelto.  OSA -No CPAP for now due to high aspiration risk  Goals of care -CODE STATUS  has been changed to DNR.  Palliative care following.  Overall prognosis is guarded to poor.  Patient is possibly close to hospice/comfort measures  Morbid obesity Outpatient follow-up leukocytosis -Resolved  Hypothyroidism -Continue levothyroxine  DVT prophylaxis: Xarelto Code Status: DNR Family Communication: Mother and sister at bedside  Disposition Plan: Status is: Inpatient Remains inpatient appropriate because: Of severity of illness  Consultants: Palliative care  Procedures: None  Antimicrobials:  Rocephin and Zithromax from 03/29/2022 onwards  Subjective: Patient seen and examined at bedside.  Poor historian.  Mother and sister at bedside.  No temperature spikes over the last 24 hours.  Mother is concerned that patient is more sleepy today and more short of breath.   Objective: Vitals:   04/02/22 2021 04/02/22 2023 04/03/22 0500 04/03/22 0538  BP:  114/66  (!) 111/58  Pulse:  80  85  Resp:  (!) 21  20  Temp:  98.5 F (36.9 C)  98.2 F (36.8 C)  TempSrc:      SpO2: 93% 95%  94%  Weight:   102.6 kg   Height:        Intake/Output Summary (Last 24 hours) at 04/03/2022 0849 Last data filed at 04/02/2022 2124 Gross per 24 hour  Intake 570 ml  Output 1700 ml  Net -1130 ml    Filed Weights   03/29/22 2228 03/30/22 0214 04/03/22 0500  Weight: 99.8 kg 104.9 kg 102.6 kg    Examination:  General: Still on 5 L oxygen via nasal cannula.  No distress.  Looks chronically ill and deconditioned.   ENT/neck: No thyromegaly.  JVD is not elevated  respiratory: Decreased breath sounds at bases bilaterally with some crackles; no wheezing.  Intermittently tachypneic CVS: S1-S2  heard, rate controlled currently Abdominal: Soft, nontender, slightly distended; no organomegaly,  bowel sounds are heard Extremities: Trace lower extremity edema; no cyanosis  CNS: Drowsy, wakes up only very slightly, hardly response.  Poor historian.  No focal neurologic deficit.  Moves  extremities Lymph: No obvious lymphadenopathy Skin: No obvious ecchymosis/lesions  psych: Could not be assessed because of mental status. Musculoskeletal: No obvious joint swelling/deformity    Data Reviewed: I have personally reviewed following labs and imaging studies  CBC: Recent Labs  Lab 03/29/22 2259 03/30/22 0539 03/31/22 0524 04/02/22 0545 04/03/22 0517  WBC 11.7* 12.9* 8.3 6.1 4.9  NEUTROABS 10.1*  --   --   --  3.0  HGB 15.1 13.7 13.1 13.9 13.5  HCT 44.5 40.7 40.5 41.5 40.3  MCV 100.9* 101.5* 103.1* 103.5* 102.5*  PLT 219 218 207 240 709    Basic Metabolic Panel: Recent Labs  Lab 03/29/22 2259 03/30/22 0539 03/31/22 0524 04/02/22 0545 04/03/22 0517  NA 142 143 144 144 144  K 3.8 4.1 3.6 3.8 3.8  CL 108 111 110 105 104  CO2 '29 25 28 31 '$ 32  GLUCOSE 116* 162* 100* 102* 97  BUN '12 11 12 14 16  '$ CREATININE 1.17 0.98 1.04 1.16 1.07  CALCIUM 8.8* 8.2* 7.8* 8.7* 8.8*  MG  --   --   --   --  2.5*    GFR: Estimated Creatinine Clearance: 80.2 mL/min (by C-G formula based on SCr of 1.07 mg/dL). Liver Function Tests: Recent Labs  Lab 03/29/22 2259 03/30/22 0539 04/03/22 0517  AST 29 32 23  ALT '22 25 25  '$ ALKPHOS 84 72 74  BILITOT 0.7 0.5 0.7  PROT 7.4 6.7 7.2  ALBUMIN 3.3* 2.9* 2.9*    No results for input(s): "LIPASE", "AMYLASE" in the last 168 hours. No results for input(s): "AMMONIA" in the last 168 hours. Coagulation Profile: Recent Labs  Lab 03/29/22 2259  INR 2.0*    Cardiac Enzymes: No results for input(s): "CKTOTAL", "CKMB", "CKMBINDEX", "TROPONINI" in the last 168 hours. BNP (last 3 results) No results for input(s): "PROBNP" in the last 8760 hours. HbA1C: No results for input(s): "HGBA1C" in the last 72 hours. CBG: No results for input(s): "GLUCAP" in the last 168 hours. Lipid Profile: No results for input(s): "CHOL", "HDL", "LDLCALC", "TRIG", "CHOLHDL", "LDLDIRECT" in the last 72 hours. Thyroid Function Tests: No results for  input(s): "TSH", "T4TOTAL", "FREET4", "T3FREE", "THYROIDAB" in the last 72 hours. Anemia Panel: No results for input(s): "VITAMINB12", "FOLATE", "FERRITIN", "TIBC", "IRON", "RETICCTPCT" in the last 72 hours. Sepsis Labs: Recent Labs  Lab 03/29/22 2259 03/30/22 0115  LATICACIDVEN 1.8 1.9     Recent Results (from the past 240 hour(s))  Resp Panel by RT-PCR (Flu A&B, Covid) Anterior Nasal Swab     Status: None   Collection Time: 03/29/22 12:05 AM   Specimen: Anterior Nasal Swab  Result Value Ref Range Status   SARS Coronavirus 2 by RT PCR NEGATIVE NEGATIVE Final    Comment: (NOTE) SARS-CoV-2 target nucleic acids are NOT DETECTED.  The SARS-CoV-2 RNA is generally detectable in upper respiratory specimens during the acute phase of infection. The lowest concentration of SARS-CoV-2 viral copies this assay can detect is 138 copies/mL. A negative result does not preclude SARS-Cov-2 infection and should not be used as the sole basis for treatment or other patient management decisions. A negative result may occur with  improper specimen collection/handling, submission of specimen other than nasopharyngeal swab, presence of viral mutation(s) within  the areas targeted by this assay, and inadequate number of viral copies(<138 copies/mL). A negative result must be combined with clinical observations, patient history, and epidemiological information. The expected result is Negative.  Fact Sheet for Patients:  EntrepreneurPulse.com.au  Fact Sheet for Healthcare Providers:  IncredibleEmployment.be  This test is no t yet approved or cleared by the [place] FDA and  has been authorized for detection and/or diagnosis of SARS-CoV-2 by FDA under an Emergency Use Authorization (EUA). This EUA will remain  in effect (meaning this test can be used) for the duration of the COVID-19 declaration under Section 564(b)(1) of the Act, 21 U.S.C.section  360bbb-3(b)(1), unless the authorization is terminated  or revoked sooner.       Influenza A by PCR NEGATIVE NEGATIVE Final   Influenza B by PCR NEGATIVE NEGATIVE Final    Comment: (NOTE) The Xpert Xpress SARS-CoV-2/FLU/RSV plus assay is intended as an aid in the diagnosis of influenza from Nasopharyngeal swab specimens and should not be used as a sole basis for treatment. Nasal washings and aspirates are unacceptable for Xpert Xpress SARS-CoV-2/FLU/RSV testing.  Fact Sheet for Patients: EntrepreneurPulse.com.au  Fact Sheet for Healthcare Providers: IncredibleEmployment.be  This test is not yet approved or cleared by the [place] FDA and has been authorized for detection and/or diagnosis of SARS-CoV-2 by FDA under an Emergency Use Authorization (EUA). This EUA will remain in effect (meaning this test can be used) for the duration of the COVID-19 declaration under Section 564(b)(1) of the Act, 21 U.S.C. section 360bbb-3(b)(1), unless the authorization is terminated or revoked.  Performed at [facility], [place] [address] [place], [place] [address]   Blood Culture (routine x 2)     Status: None (Preliminary result)   Collection Time: 03/29/22 10:45 PM   Specimen: BLOOD  Result Value Ref Range Status   Specimen Description   Final    BLOOD BLOOD LEFT ARM Performed at [facility] [address], [place], [place] [address]    Special Requests   Final    BOTTLES DRAWN AEROBIC AND ANAEROBIC Blood Culture adequate volume Performed at [facility] [address], [place], [place] [address]    Culture   Final    NO GROWTH 4 DAYS Performed at [facility], [place] [address] [place], [place] [address]    Report Status PENDING  Incomplete  Blood Culture (routine x 2)     Status: None (Preliminary result)   Collection Time: 03/29/22 11:40 PM   Specimen: BLOOD  Result Value Ref  Range Status   Specimen Description   Final    BLOOD BLOOD RIGHT HAND Performed at [facility] [address] [place], [place] [address]    Special Requests   Final    BOTTLES DRAWN AEROBIC AND ANAEROBIC Blood Culture adequate volume Performed at [facility] [address] [place], [place] [address]    Culture   Final    NO GROWTH 4 DAYS Performed at [facility], [place] [address], [place], [place] [address]    Report Status PENDING  Incomplete  Urine Culture     Status: Abnormal   Collection Time: 03/30/22  3:34 AM   Specimen: In/Out Cath Urine  Result Value Ref Range Status   Specimen Description   Final    IN/OUT CATH URINE Performed at [facility] [address], [place], [place] [address]    Special Requests  Final    NONE Performed at [facility], [place] [address] [place], [place] [address]    Culture 20,000 COLONIES/mL STAPHYLOCOCCUS HAEMOLYTICUS (A)  Final   Report Status 04/01/2022 FINAL  Final   Organism ID, Bacteria STAPHYLOCOCCUS HAEMOLYTICUS (A)  Final      Susceptibility   Staphylococcus haemolyticus - MIC*    CIPROFLOXACIN >=8 RESISTANT Resistant     GENTAMICIN <=0.5 SENSITIVE Sensitive     NITROFURANTOIN <=16 SENSITIVE Sensitive     OXACILLIN >=4 RESISTANT Resistant     TETRACYCLINE >=16 RESISTANT Resistant     VANCOMYCIN 1 SENSITIVE Sensitive     TRIMETH/SULFA 80 RESISTANT Resistant     CLINDAMYCIN >=8 RESISTANT Resistant     RIFAMPIN <=0.5 SENSITIVE Sensitive     Inducible Clindamycin NEGATIVE Sensitive     * 20,000 COLONIES/mL STAPHYLOCOCCUS HAEMOLYTICUS         Radiology Studies: DG CHEST PORT 1 VIEW  Result Date: 04/02/2022 CLINICAL DATA:  Dyspnea EXAM: PORTABLE CHEST 1 VIEW COMPARISON:  Chest radiograph 1 day prior FINDINGS: The heart is enlarged, unchanged. The upper mediastinal contours are stable. There are diffusely increased interstitial markings  throughout both lungs likely reflecting pulmonary interstitial edema with small right and probable trace left bilateral effusions and adjacent bibasilar opacities. Overall, aeration is worsened compared to the study from 03/30/2022. There is no pneumothorax. There is no acute osseous abnormality. IMPRESSION: Mild pulmonary interstitial edema and small right and probable trace left pleural effusions with adjacent bibasilar airspace opacities, overall worsened since 03/30/2022. Electronically Signed   By: Valetta Mole M.D.   On: 04/02/2022 10:13        Scheduled Meds:  acidophilus  1 capsule Oral Daily   ascorbic acid  500 mg Oral Daily   calcium carbonate  1 tablet Oral Daily   And   magnesium oxide  200 mg Oral Daily   And   zinc sulfate  220 mg Oral Daily   cholecalciferol  1,000 Units Oral Daily   ezetimibe  10 mg Oral QHS   glycopyrrolate  0.2 mg Intravenous Q8H   guaiFENesin  600 mg Oral BID   ipratropium-albuterol  3 mL Nebulization TID   lamoTRIgine  100 mg Oral BID   levothyroxine  25 mcg Oral Q0600   mirtazapine  7.5 mg Oral QHS   omega-3 acid ethyl esters  1 g Oral Daily   rivaroxaban  20 mg Oral Q supper   scopolamine  1 patch Transdermal Q72H   Continuous Infusions:  cefTRIAXone (ROCEPHIN)  IV 2 g (04/02/22 2201)          Aline August, MD [facility] 04/03/2022, 8:49 AM

## 2022-04-03 NOTE — Progress Notes (Addendum)
Daily Progress Note   Patient Name: Joseph Williams       Date: 04/03/2022 DOB: [birth year]  Age: 56 y.o. MRN#: [id number] Attending Physician: Aline August, MD Primary Care Physician: Willey Blade, MD Admit Date: 03/29/2022  Reason for Consultation/Follow-up: Establishing goals of care  Subjective: Awake but not alert, has loud noisy breathing, appears in mild to moderate respiratory distress. Family at bedside including mother, sister and brother.  Family meeting held to discuss goals of care discussions and disposition options.  See below.  Length of Stay: 4  Current Medications: Scheduled Meds:   ascorbic acid  500 mg Oral Daily   ezetimibe  10 mg Oral QHS   furosemide  40 mg Intravenous Once   glycopyrrolate  0.2 mg Intravenous Q8H   ipratropium-albuterol  3 mL Nebulization TID   lamoTRIgine  100 mg Oral BID   mirtazapine  7.5 mg Oral QHS    Continuous Infusions:  cefTRIAXone (ROCEPHIN)  IV 2 g (04/02/22 2201)   morphine      PRN Meds: ipratropium-albuterol, lip balm, morphine **AND** morphine, mouth rinse  Physical Exam         Awake but not entirely alert Has coarse rhonchorous breath sounds Ongoing noisy breathing Mild to moderate respiratory distress Appears with generalized weakness Abdomen not distended  Vital Signs: BP (!) 111/58 (BP Location: Left Arm)   Pulse 85   Temp 98.2 F (36.8 C)   Resp 20   Ht 5' (1.524 m)   Wt 102.6 kg   SpO2 93%   BMI 44.18 kg/m  SpO2: SpO2: 93 % O2 Device: O2 Device: Nasal Cannula O2 Flow Rate: O2 Flow Rate (L/min): 4 L/min  Intake/output summary:  Intake/Output Summary (Last 24 hours) at 04/03/2022 1018 Last data filed at 04/03/2022 0900 Gross per 24 hour  Intake 330 ml  Output 1500 ml  Net -1170 ml    LBM:  Last BM Date : 04/01/22 Baseline Weight: Weight: 99.8 kg Most recent weight: Weight: 102.6 kg       Palliative Assessment/Data:      Patient Active Problem List   Diagnosis Date Noted   Acute respiratory failure with hypoxia ([place]) 04/02/2022   Sepsis due to pneumonia ([place]) 03/30/2022   Hypothyroidism 03/30/2022   COVID-19 virus infection 06/10/2021   Seizure ([place]) 06/09/2021  Cholesteatoma 06/09/2021   History of DVT (deep vein thrombosis) 06/09/2021   OSA (obstructive sleep apnea) 02/02/2020   Pneumonia, organism unspecified([place]) 12/23/2011   Down's syndrome 11/25/2011    Palliative Care Assessment & Plan   Patient Profile:    Assessment: 56 year old gentleman who lives at home with his parents, history of Down syndrome, dyslipidemia hypothyroidism OSA, seizure disorder admitted to hospital medicine service for fever cough shortness of breath pneumonia aspiration versus community-acquired.  Hospital course complicated by ongoing hypoxic respiratory failure, patient with ongoing increased secretions and increased work of breathing of an episodic nature.  Palliative medicine team consulted for goals of care discussions.  Recommendations/Plan: Family meeting was held at the bedside with the patient's mother, sister and brother.  Goals of care discussions were undertaken.  We reviewed the patient's current condition as well as how this hospitalization has been going.  We discussed about his underlying conditions.  Goals wishes and values attempted to be explored.   Plan: Comfort measures only Chaplain consult Morphine low-dose but continuous infusion as well as provision for boluses to be made available Discontinue medications and blood work not contributing to comfort [facility] consult for facilitating transfer to residential hospice. Prognosis likely appears to be few days to less than 2 weeks in my opinion. Goals of Care and Additional Recommendations: Limitations on Scope of  Treatment: Comfort measures only  Code Status:    Code Status Orders  (From admission, onward)           Start     Ordered   04/01/22 1409  Do not attempt resuscitation (DNR)  Continuous       Question Answer Comment  In the event of cardiac or respiratory ARREST Do not call a "code blue"   In the event of cardiac or respiratory ARREST Do not perform Intubation, CPR, defibrillation or ACLS   In the event of cardiac or respiratory ARREST Use medication by any route, position, wound care, and other measures to relive pain and suffering. May use oxygen, suction and manual treatment of airway obstruction as needed for comfort.      04/01/22 1408           Code Status History     Date Active Date Inactive Code Status Order ID Comments User Context   03/30/2022 0231 04/01/2022 1408 Full Code [id number]  Elwyn Reach, MD Inpatient   06/09/2021 1213 06/11/2021 1940 Full Code [id number]  Norval Morton, MD ED       Prognosis: 3 to 4 days in my opinion.  Discharge Planning: Residential hospice  Care plan was discussed with patient's mother RN Luisenrique Conran at bedside.  Also met with patient's brother and sister. Discussed with TOC and bedside RN appreciate their assistance.  Thank you for allowing the Palliative Medicine Team to assist in the care of this patient.  MOD MDM.      Greater than 50%  of this time was spent counseling and coordinating care related to the above assessment and plan.  Loistine Chance, MD  Please contact Palliative Medicine Team phone at [phone] for questions and concerns.

## 2022-04-03 NOTE — Progress Notes (Signed)
Chaplain engaged in an initial visit with Kym and his family.  Chaplain provided space for them to share stories about Mikkel.  Chaplain learned that Moustapha is a humorous, one of a kind individual that has been very independent and self-sufficient.  He loves making others smile, making various animal sounds, and joking with others. He is the youngest of his sister and brother.  They are very close knit.    Ukiah worked at the [employer] and at [employer].  He loves his church and working with children.  His mom expressed that Sipriano had significant changes within the last year after having an eye surgery.  He went from being very active to needing a lot of assistance.  Mom voiced that they have had some significant conversations in the past about when this time would come.   Chaplain could assess that Avraham is surrounded by a lot of love.  He was able to continue joking and laughing with his siblings and family.  Chaplain provided prayer with them, reflective listening and support.  Tobiah's Doristine Bosworth has been able to visit.     04/03/22 1200  Clinical Encounter Type  Visited With Patient and family together  Visit Type Initial;Spiritual support  Spiritual Encounters  Spiritual Needs Prayer;Emotional;Grief support

## 2022-04-03 NOTE — Progress Notes (Signed)
SLP discontinue Note  Patient Details Name: Joseph Williams MRN: [id number] DOB: 1966/07/30  Reason Eval/Treat Not Completed: Other (comment) (pt now comfort care, will sign off. thanks) Kathleen Lime, MS [place] Office [phone] Pager [phone]   Macario Golds 04/03/2022, 11:11 AM

## 2022-04-04 LAB — CULTURE, BLOOD (ROUTINE X 2)
Culture: NO GROWTH
Culture: NO GROWTH
Special Requests: ADEQUATE
Special Requests: ADEQUATE

## 2022-04-05 DEATH — deceased
